# Patient Record
Sex: Female | Born: 1960 | Race: White | Hispanic: No | Marital: Married | Smoking: Current every day smoker
Health system: Southern US, Community
[De-identification: ages and names within clinical notes are randomized; demographics above are authoritative.]

## PROBLEM LIST (undated history)

## (undated) DIAGNOSIS — K5792 Diverticulitis of intestine, part unspecified, without perforation or abscess without bleeding: Secondary | ICD-10-CM

## (undated) DIAGNOSIS — D649 Anemia, unspecified: Secondary | ICD-10-CM

## (undated) DIAGNOSIS — I10 Essential (primary) hypertension: Secondary | ICD-10-CM

## (undated) DIAGNOSIS — J449 Chronic obstructive pulmonary disease, unspecified: Secondary | ICD-10-CM

## (undated) DIAGNOSIS — C539 Malignant neoplasm of cervix uteri, unspecified: Secondary | ICD-10-CM

## (undated) DIAGNOSIS — K219 Gastro-esophageal reflux disease without esophagitis: Secondary | ICD-10-CM

## (undated) DIAGNOSIS — J302 Other seasonal allergic rhinitis: Secondary | ICD-10-CM

## (undated) DIAGNOSIS — T7840XA Allergy, unspecified, initial encounter: Secondary | ICD-10-CM

## (undated) HISTORY — PX: APPENDECTOMY: SHX54

## (undated) HISTORY — DX: Anemia, unspecified: D64.9

## (undated) HISTORY — DX: Gastro-esophageal reflux disease without esophagitis: K21.9

## (undated) HISTORY — DX: Malignant neoplasm of cervix uteri, unspecified: C53.9

## (undated) HISTORY — DX: Chronic obstructive pulmonary disease, unspecified: J44.9

## (undated) HISTORY — DX: Allergy, unspecified, initial encounter: T78.40XA

## (undated) HISTORY — PX: LASER ABLATION CONDYLOMA CERVICAL / VULVAR: SUR819

## (undated) HISTORY — PX: WISDOM TOOTH EXTRACTION: SHX21

---

## 2011-11-22 ENCOUNTER — Other Ambulatory Visit (HOSPITAL_COMMUNITY): Payer: Self-pay

## 2011-11-22 ENCOUNTER — Emergency Department (HOSPITAL_COMMUNITY)
Admission: EM | Admit: 2011-11-22 | Discharge: 2011-11-22 | Disposition: A | Discharge status: Home Health | Payer: Self-pay | Attending: Emergency Medicine | Admitting: Emergency Medicine

## 2011-11-22 ENCOUNTER — Emergency Department (HOSPITAL_COMMUNITY): Payer: Self-pay

## 2011-11-22 ENCOUNTER — Encounter (HOSPITAL_COMMUNITY): Payer: Self-pay

## 2011-11-22 DIAGNOSIS — K5732 Diverticulitis of large intestine without perforation or abscess without bleeding: Secondary | ICD-10-CM | POA: Insufficient documentation

## 2011-11-22 DIAGNOSIS — K5792 Diverticulitis of intestine, part unspecified, without perforation or abscess without bleeding: Secondary | ICD-10-CM

## 2011-11-22 DIAGNOSIS — R109 Unspecified abdominal pain: Secondary | ICD-10-CM | POA: Insufficient documentation

## 2011-11-22 HISTORY — DX: Diverticulitis of intestine, part unspecified, without perforation or abscess without bleeding: K57.92

## 2011-11-22 LAB — URINALYSIS, ROUTINE W REFLEX MICROSCOPIC
Bilirubin Urine: NEGATIVE
Glucose, UA: NEGATIVE mg/dL
Ketones, ur: NEGATIVE mg/dL
Leukocytes, UA: NEGATIVE
Specific Gravity, Urine: 1.01 (ref 1.005–1.030)
pH: 6.5 (ref 5.0–8.0)

## 2011-11-22 LAB — COMPREHENSIVE METABOLIC PANEL
AST: 17 U/L (ref 0–37)
Albumin: 3.5 g/dL (ref 3.5–5.2)
Alkaline Phosphatase: 102 U/L (ref 39–117)
Chloride: 102 mEq/L (ref 96–112)
Potassium: 3.9 mEq/L (ref 3.5–5.1)
Total Bilirubin: 0.5 mg/dL (ref 0.3–1.2)
Total Protein: 7.1 g/dL (ref 6.0–8.3)

## 2011-11-22 LAB — URINE MICROSCOPIC-ADD ON

## 2011-11-22 LAB — DIFFERENTIAL
Basophils Absolute: 0.1 10*3/uL (ref 0.0–0.1)
Basophils Relative: 0 % (ref 0–1)
Neutro Abs: 11.1 10*3/uL — ABNORMAL HIGH (ref 1.7–7.7)
Neutrophils Relative %: 74 % (ref 43–77)

## 2011-11-22 LAB — CBC
MCHC: 35.1 g/dL (ref 30.0–36.0)
Platelets: 273 10*3/uL (ref 150–400)
RDW: 11.8 % (ref 11.5–15.5)

## 2011-11-22 MED ORDER — ONDANSETRON HCL 4 MG/2ML IJ SOLN
4.0000 mg | Freq: Once | INTRAMUSCULAR | Status: AC
  Administered 2011-11-22: 4 mg via INTRAVENOUS
  Filled 2011-11-22: qty 2

## 2011-11-22 MED ORDER — CIPROFLOXACIN HCL 500 MG PO TABS: 500.0000 mg | ORAL_TABLET | Freq: Two times a day (BID) | ORAL | Status: AC

## 2011-11-22 MED ORDER — IOHEXOL 300 MG/ML  SOLN
100.0000 mL | Freq: Once | INTRAMUSCULAR | Status: AC | PRN
  Administered 2011-11-22: 100 mL via INTRAVENOUS

## 2011-11-22 MED ORDER — ONDANSETRON HCL 4 MG PO TABS: 4.0000 mg | ORAL_TABLET | Freq: Four times a day (QID) | ORAL | Status: AC

## 2011-11-22 MED ORDER — CIPROFLOXACIN IN D5W 400 MG/200ML IV SOLN
400.0000 mg | Freq: Once | INTRAVENOUS | Status: AC
  Administered 2011-11-22: 400 mg via INTRAVENOUS
  Filled 2011-11-22: qty 200

## 2011-11-22 MED ORDER — OXYCODONE-ACETAMINOPHEN 5-325 MG PO TABS: 2.0000 | ORAL_TABLET | ORAL | Status: AC | PRN

## 2011-11-22 MED ORDER — METRONIDAZOLE 500 MG PO TABS: 500.0000 mg | ORAL_TABLET | Freq: Two times a day (BID) | ORAL | Status: AC

## 2011-11-22 MED ORDER — SODIUM CHLORIDE 0.9 % IV BOLUS (SEPSIS)
1000.0000 mL | Freq: Once | INTRAVENOUS | Status: AC
  Administered 2011-11-22: 1000 mL via INTRAVENOUS

## 2011-11-22 MED ORDER — HYDROMORPHONE HCL PF 1 MG/ML IJ SOLN
1.0000 mg | Freq: Once | INTRAMUSCULAR | Status: AC
  Administered 2011-11-22: 1 mg via INTRAVENOUS
  Filled 2011-11-22: qty 1

## 2011-11-22 MED ORDER — METRONIDAZOLE IN NACL 5-0.79 MG/ML-% IV SOLN
500.0000 mg | Freq: Once | INTRAVENOUS | Status: AC
  Administered 2011-11-22: 500 mg via INTRAVENOUS
  Filled 2011-11-22: qty 100

## 2011-11-22 NOTE — ED Notes (Signed)
Patient reports that she has had a cramping mid abdominal pain since yesterday that has gotten progressively worse. Patient also c/o nausea, but denies vomiting or diarrhea.

## 2011-11-22 NOTE — Discharge Instructions (Signed)
Diverticulitis Small pockets or "bubbles" can develop in the wall of the intestine. Diverticulitis is when those pockets become infected and inflamed. This causes stomach pain (usually on the left side). HOME CARE  Take all medicine as told by your doctor.   Try a clear liquid diet (broth, tea, or water) for as long as told by your doctor.   Keep all follow-up visits with your doctor.   You may be put on a low-fiber diet once you start feeling better. Here are foods that have low-fiber:   White breads, cereals, rice, and pasta.   Cooked fruits and vegetables or soft fresh fruits and vegetables without the skin.   Ground or well-cooked tender beef, ham, veal, lamb, pork, or poultry.   Eggs and seafood.   After you are doing well on the low-fiber diet, you may be put on a high-fiber diet. Here are ways to increase your fiber:   Choose whole-grain breads, cereals, pasta, and brown rice.   Choose fruits and vegetables with skin on. Do not overcook the vegetables.   Choose nuts, seeds, legumes, dried peas, beans, and lentils.   Look for food products that have more than 3 grams of fiber per serving on the food label.  GET HELP RIGHT AWAY IF:  Your pain does not get better or gets worse.   You have trouble eating food.   You are not pooping (having bowel movements) like normal.   You have a temperature by mouth above 102 F (38.9 C), not controlled by medicine.   You keep throwing up (vomiting).   You have bloody or black, tarry poop (stools).   You are getting worse and not better.  MAKE SURE YOU:   Understand these instructions.   Will watch your condition.   Will get help right away if you are not doing well or get worse.  Document Released: 01/03/2008 Document Revised: 07/06/2011 Document Reviewed: 06/07/2009 [facility] Patient Information 2012 [place], [place].  2 different antibiotics, medications for pain and nausea, avoid rich greasy foods. Return if getting  worse in any way, such as fever, chills, worsening pain

## 2011-11-22 NOTE — ED Provider Notes (Addendum)
History     CSN: [id number]  Arrival date & time 11/22/11  0846   First MD Initiated Contact with Patient 11/22/11 [phone]      Chief Complaint  Patient presents with  . Abdominal Pain    (Consider location/radiation/quality/duration/timing/severity/associated sxs/prior treatment) HPI.... lower abdominal pain for approximately one day. She states history of diverticulitis and this feels similarly.  No fever sweats or chills. Palpation makes pain worse. pain is moderate. No radiation. Described as sharp  Past Medical History  Diagnosis Date  . Diverticulitis     Past Surgical History  Procedure Date  . Appendectomy     No family history on file.  History  Substance Use Topics  . Smoking status: Current Everyday Smoker -- 1.0 packs/day for 25 years    Types: Cigarettes  . Smokeless tobacco: Not on file  . Alcohol Use: Yes     occasionally    OB History    Grav Para Term Preterm Abortions TAB SAB Ect Mult Living                  Review of Systems  All other systems reviewed and are negative.    Allergies  Review of patient's allergies indicates no known allergies.  Home Medications   Current Outpatient Rx  Name Route Sig Dispense Refill  . NAPROXEN SODIUM 220 MG PO TABS Oral Take 660 mg by mouth 2 (two) times daily with a meal.    . NYQUIL MULTI-SYMPTOM PO Oral Take 15 mLs by mouth at bedtime.    Marland Kitchen CIPROFLOXACIN HCL 500 MG PO TABS Oral Take 1 tablet (500 mg total) by mouth 2 (two) times daily. 20 tablet 0  . METRONIDAZOLE 500 MG PO TABS Oral Take 1 tablet (500 mg total) by mouth 2 (two) times daily. 20 tablet 0  . ONDANSETRON HCL 4 MG PO TABS Oral Take 1 tablet (4 mg total) by mouth every 6 (six) hours. 10 tablet 0  . OXYCODONE-ACETAMINOPHEN 5-325 MG PO TABS Oral Take 2 tablets by mouth every 4 (four) hours as needed for pain. 20 tablet 0    BP 141/90  Pulse 107  Temp(Src) 98.2 F (36.8 C) (Oral)  Resp 20  SpO2 95%  Physical Exam  Nursing note and  vitals reviewed. Constitutional: She is oriented to person, place, and time. She appears well-developed and well-nourished.  HENT:  Head: Normocephalic and atraumatic.  Eyes: Conjunctivae and EOM are normal. Pupils are equal, round, and reactive to light.  Neck: Normal range of motion. Neck supple.  Cardiovascular: Normal rate and regular rhythm.   Pulmonary/Chest: Effort normal and breath sounds normal.  Abdominal: Bowel sounds are normal.       Minimal tenderness lower abdomen  Musculoskeletal: Normal range of motion.  Neurological: She is alert and oriented to person, place, and time.  Skin: Skin is warm and dry.  Psychiatric: She has a normal mood and affect.    ED Course  Procedures (including critical care time)  Labs Reviewed  CBC - Abnormal; Notable for the following:    WBC 14.9 (*)    All other components within normal limits  DIFFERENTIAL - Abnormal; Notable for the following:    Neutro Abs 11.1 (*)    Monocytes Absolute 1.4 (*)    All other components within normal limits  COMPREHENSIVE METABOLIC PANEL - Abnormal; Notable for the following:    Glucose, Bld 106 (*)    All other components within normal limits  URINALYSIS, ROUTINE W  REFLEX MICROSCOPIC - Abnormal; Notable for the following:    APPearance CLOUDY (*)    Hgb urine dipstick SMALL (*)    All other components within normal limits  URINE MICROSCOPIC-ADD ON - Abnormal; Notable for the following:    Squamous Epithelial / LPF FEW (*)    Bacteria, UA FEW (*)    All other components within normal limits  LIPASE, BLOOD   Ct Abdomen Pelvis W Contrast  11/22/2011  *RADIOLOGY REPORT*  Clinical Data: Left lower quadrant pain.  History of diverticulitis.  Previous appendectomy.  CT ABDOMEN AND PELVIS WITH CONTRAST  Technique:  Multidetector CT imaging of the abdomen and pelvis was performed following the standard protocol during bolus administration of intravenous contrast.  Contrast: OMNIPAQUE IOHEXOL 300 MG/ML   SOLN  Comparison: None.  Findings: Lung bases are clear.  No pleural or pericardial fluid. Minimal scarring at the right base posteriorly.  The liver does not show any focal lesions.  No calcified gallstones.  The spleen is normal.  The pancreas is normal.  The adrenal glands are normal.  The kidneys are normal.  The aorta shows atherosclerotic change but no aneurysm.  The IVC is normal. No retroperitoneal mass or adenopathy.  No free intraperitoneal fluid or air.  The patient does have diverticulosis of the left colon.  In the sigmoid region, there is acute diverticulitis with wall thickening and stranding of the pericolic fat.  There is a tiny amount of free fluid in the pelvis.  Uterus and adnexal regions appear unremarkable.  No significant bony finding.  IMPRESSION: Sigmoid diverticulitis.  Stranding of the pericolic fat but no definable abscess.  Small amount of free fluid in the cul-de-sac.  Premature atherosclerosis of the aorta.  Original Report Authenticated By: Thomasenia Sales, M.D.     1. Diverticulitis       MDM  CT scan shows sigmoid diverticulitis. Patient is hemodynamically stable and wants to try home treatment.  We'll discharge home with Flagyl and Cipro. This was discussed with patient in detail.        Donnetta Hutching, MD 11/30/11 6213  Donnetta Hutching, MD 11/30/11 2303

## 2012-03-12 ENCOUNTER — Emergency Department (HOSPITAL_COMMUNITY): Payer: Self-pay

## 2012-03-12 ENCOUNTER — Encounter (HOSPITAL_COMMUNITY): Payer: Self-pay | Admitting: Emergency Medicine

## 2012-03-12 ENCOUNTER — Emergency Department (HOSPITAL_COMMUNITY)
Admission: EM | Admit: 2012-03-12 | Discharge: 2012-03-12 | Disposition: A | Discharge status: Home / Self Care | Payer: Self-pay | Attending: Emergency Medicine | Admitting: Emergency Medicine

## 2012-03-12 DIAGNOSIS — J209 Acute bronchitis, unspecified: Secondary | ICD-10-CM | POA: Insufficient documentation

## 2012-03-12 DIAGNOSIS — Z9089 Acquired absence of other organs: Secondary | ICD-10-CM | POA: Insufficient documentation

## 2012-03-12 DIAGNOSIS — F17200 Nicotine dependence, unspecified, uncomplicated: Secondary | ICD-10-CM | POA: Insufficient documentation

## 2012-03-12 MED ORDER — ALBUTEROL SULFATE HFA 108 (90 BASE) MCG/ACT IN AERS
2.0000 | INHALATION_SPRAY | RESPIRATORY_TRACT | Status: DC | PRN
  Administered 2012-03-12: 2 via RESPIRATORY_TRACT
  Filled 2012-03-12: qty 6.7

## 2012-03-12 MED ORDER — ALBUTEROL SULFATE (5 MG/ML) 0.5% IN NEBU
5.0000 mg | INHALATION_SOLUTION | Freq: Once | RESPIRATORY_TRACT | Status: AC
  Administered 2012-03-12: 5 mg via RESPIRATORY_TRACT
  Filled 2012-03-12: qty 1

## 2012-03-12 NOTE — ED Notes (Signed)
Started 4 days ago with sinus drainage, cough, chills. Shortness of breath

## 2012-03-12 NOTE — ED Notes (Signed)
resp therapist called 

## 2012-03-12 NOTE — ED Notes (Signed)
resp therapist called for abuterol HFA,

## 2012-03-12 NOTE — ED Provider Notes (Signed)
History     CSN: [id number]  Arrival date & time 03/12/12  0805   First MD Initiated Contact with Patient 03/12/12 [phone]      Chief Complaint  Patient presents with  . Cough  . URI  . Shortness of Breath    (Consider location/radiation/quality/duration/timing/severity/associated sxs/prior treatment) HPI Complains of cough shortness of breath nasal congestion onset 4 days ago. Cough with slight amount of white sputum patient reports fever 101 yesterday no other associated symptoms nothing makes symptoms better or worse 2 Nyquill and Aleve without relief Past Medical History  Diagnosis Date  . Diverticulitis     Past Surgical History  Procedure Date  . Appendectomy     No family history on file.  History  Substance Use Topics  . Smoking status: Current Everyday Smoker -- 0.5 packs/day for 25 years    Types: Cigarettes  . Smokeless tobacco: Not on file  . Alcohol Use: Yes     occasionally    OB History    Grav Para Term Preterm Abortions TAB SAB Ect Mult Living                  Review of Systems  Constitutional: Positive for fever.  HENT: Positive for congestion.   Respiratory: Positive for cough and shortness of breath.   Cardiovascular: Negative.   Gastrointestinal: Negative.   Musculoskeletal: Negative.   Skin: Negative.   Neurological: Negative.   Hematological: Negative.   Psychiatric/Behavioral: Negative.   All other systems reviewed and are negative.    Allergies  Review of patient's allergies indicates no known allergies.  Home Medications   Current Outpatient Rx  Name Route Sig Dispense Refill  . NAPROXEN SODIUM 220 MG PO TABS Oral Take 660 mg by mouth 2 (two) times daily with a meal.      BP 157/91  Pulse 87  Temp 98.3 F (36.8 C) (Oral)  Resp 20  SpO2 96%  Physical Exam  Nursing note and vitals reviewed. Constitutional: She appears well-developed and well-nourished.  HENT:  Head: Normocephalic and atraumatic.  Right Ear:  External ear normal.  Left Ear: External ear normal.  Nose: Nose normal.  Mouth/Throat: No oropharyngeal exudate.       Bilateral tympanic membranes normal  Eyes: Conjunctivae are normal. Pupils are equal, round, and reactive to light.  Neck: Neck supple. No tracheal deviation present. No thyromegaly present.  Cardiovascular: Normal rate and regular rhythm.   No murmur heard. Pulmonary/Chest: Effort normal.       Coughing frequently diffuse scattered rhonchi  Abdominal: Soft. Bowel sounds are normal. She exhibits no distension. There is no tenderness.  Musculoskeletal: Normal range of motion. She exhibits no edema and no tenderness.  Neurological: She is alert. Coordination normal.  Skin: Skin is warm and dry. No rash noted.  Psychiatric: She has a normal mood and affect.    ED Course  Procedures (including critical care time)  Labs Reviewed - No data to display No results found.  Results for orders placed during the hospital encounter of 11/22/11  CBC      Component Value Range   WBC 14.9 (*) 4.0 - 10.5 K/uL   RBC 3.98  3.87 - 5.11 MIL/uL   Hemoglobin 13.4  12.0 - 15.0 g/dL   HCT 11.9  14.7 - 82.9 %   MCV 96.0  78.0 - 100.0 fL   MCH 33.7  26.0 - 34.0 pg   MCHC 35.1  30.0 - 36.0 g/dL   RDW  11.8  11.5 - 15.5 %   Platelets 273  150 - 400 K/uL  DIFFERENTIAL      Component Value Range   Neutrophils Relative 74  43 - 77 %   Neutro Abs 11.1 (*) 1.7 - 7.7 K/uL   Lymphocytes Relative 15  12 - 46 %   Lymphs Abs 2.3  0.7 - 4.0 K/uL   Monocytes Relative 9  3 - 12 %   Monocytes Absolute 1.4 (*) 0.1 - 1.0 K/uL   Eosinophils Relative 1  0 - 5 %   Eosinophils Absolute 0.1  0.0 - 0.7 K/uL   Basophils Relative 0  0 - 1 %   Basophils Absolute 0.1  0.0 - 0.1 K/uL  COMPREHENSIVE METABOLIC PANEL      Component Value Range   Sodium 135  135 - 145 mEq/L   Potassium 3.9  3.5 - 5.1 mEq/L   Chloride 102  96 - 112 mEq/L   CO2 22  19 - 32 mEq/L   Glucose, Bld 106 (*) 70 - 99 mg/dL   BUN 8   6 - 23 mg/dL   Creatinine, Ser 1.61  0.50 - 1.10 mg/dL   Calcium 8.8  8.4 - 09.6 mg/dL   Total Protein 7.1  6.0 - 8.3 g/dL   Albumin 3.5  3.5 - 5.2 g/dL   AST 17  0 - 37 U/L   ALT 14  0 - 35 U/L   Alkaline Phosphatase 102  39 - 117 U/L   Total Bilirubin 0.5  0.3 - 1.2 mg/dL   GFR calc non Af Amer >90  >90 mL/min   GFR calc Af Amer >90  >90 mL/min  LIPASE, BLOOD      Component Value Range   Lipase 14  11 - 59 U/L  URINALYSIS, ROUTINE W REFLEX MICROSCOPIC      Component Value Range   Color, Urine YELLOW  YELLOW   APPearance CLOUDY (*) CLEAR   Specific Gravity, Urine 1.010  1.005 - 1.030   pH 6.5  5.0 - 8.0   Glucose, UA NEGATIVE  NEGATIVE mg/dL   Hgb urine dipstick SMALL (*) NEGATIVE   Bilirubin Urine NEGATIVE  NEGATIVE   Ketones, ur NEGATIVE  NEGATIVE mg/dL   Protein, ur NEGATIVE  NEGATIVE mg/dL   Urobilinogen, UA 0.2  0.0 - 1.0 mg/dL   Nitrite NEGATIVE  NEGATIVE   Leukocytes, UA NEGATIVE  NEGATIVE  URINE MICROSCOPIC-ADD ON      Component Value Range   Squamous Epithelial / LPF FEW (*) RARE   RBC / HPF 3-6  <3 RBC/hpf   Bacteria, UA FEW (*) RARE   Urine-Other MUCOUS PRESENT     Dg Chest 2 View  03/12/2012  *RADIOLOGY REPORT*  Clinical Data: Croupy cough, fever, congestion.  CHEST - 2 VIEW  Comparison: None  Findings: Heart and mediastinal contours are within normal limits. No focal opacities or effusions.  No acute bony abnormality.  IMPRESSION: No active cardiopulmonary disease.  Original Report Authenticated By: Cyndie Chime, M.D.    No diagnosis found.   Chest xray reviewed by me  Breathing somewhat improved after treatment with albuterol neb MDM  Plan albuterol HFA to go with spacer 2 puffs every 4 hours when necessary cough shortness of breath Smoking cessation encouraged(spent 5 minutes counciling pt on smoking cessation) bp rdecheck Resource guide referral Diagnosis #1 acute bronchitis #2 tobacco abuse #3 elevated blood pressure        Valerie Bugh,  MD 03/12/12 1042

## 2013-09-18 ENCOUNTER — Encounter (HOSPITAL_COMMUNITY): Payer: Self-pay | Admitting: Emergency Medicine

## 2013-09-18 ENCOUNTER — Emergency Department (HOSPITAL_COMMUNITY): Payer: Self-pay

## 2013-09-18 ENCOUNTER — Emergency Department (HOSPITAL_COMMUNITY)
Admission: EM | Admit: 2013-09-18 | Discharge: 2013-09-18 | Disposition: A | Discharge status: Home / Self Care | Payer: Self-pay | Attending: Emergency Medicine | Admitting: Emergency Medicine

## 2013-09-18 DIAGNOSIS — Z7982 Long term (current) use of aspirin: Secondary | ICD-10-CM | POA: Insufficient documentation

## 2013-09-18 DIAGNOSIS — Y9289 Other specified places as the place of occurrence of the external cause: Secondary | ICD-10-CM | POA: Insufficient documentation

## 2013-09-18 DIAGNOSIS — Z791 Long term (current) use of non-steroidal anti-inflammatories (NSAID): Secondary | ICD-10-CM | POA: Insufficient documentation

## 2013-09-18 DIAGNOSIS — K625 Hemorrhage of anus and rectum: Secondary | ICD-10-CM

## 2013-09-18 DIAGNOSIS — R109 Unspecified abdominal pain: Secondary | ICD-10-CM | POA: Insufficient documentation

## 2013-09-18 DIAGNOSIS — F17200 Nicotine dependence, unspecified, uncomplicated: Secondary | ICD-10-CM | POA: Insufficient documentation

## 2013-09-18 DIAGNOSIS — M549 Dorsalgia, unspecified: Secondary | ICD-10-CM

## 2013-09-18 DIAGNOSIS — Y9323 Activity, snow (alpine) (downhill) skiing, snow boarding, sledding, tobogganing and snow tubing: Secondary | ICD-10-CM | POA: Insufficient documentation

## 2013-09-18 DIAGNOSIS — Z79899 Other long term (current) drug therapy: Secondary | ICD-10-CM | POA: Insufficient documentation

## 2013-09-18 DIAGNOSIS — X58XXXA Exposure to other specified factors, initial encounter: Secondary | ICD-10-CM | POA: Insufficient documentation

## 2013-09-18 DIAGNOSIS — IMO0002 Reserved for concepts with insufficient information to code with codable children: Secondary | ICD-10-CM | POA: Insufficient documentation

## 2013-09-18 LAB — CBC
HEMATOCRIT: 42.7 % (ref 36.0–46.0)
Hemoglobin: 14.8 g/dL (ref 12.0–15.0)
MCH: 33.6 pg (ref 26.0–34.0)
MCHC: 34.7 g/dL (ref 30.0–36.0)
MCV: 96.8 fL (ref 78.0–100.0)
Platelets: 273 10*3/uL (ref 150–400)
RBC: 4.41 MIL/uL (ref 3.87–5.11)
RDW: 12.2 % (ref 11.5–15.5)
WBC: 10.4 10*3/uL (ref 4.0–10.5)

## 2013-09-18 LAB — COMPREHENSIVE METABOLIC PANEL
ALT: 20 U/L (ref 0–35)
AST: 21 U/L (ref 0–37)
Albumin: 4.3 g/dL (ref 3.5–5.2)
Alkaline Phosphatase: 108 U/L (ref 39–117)
BILIRUBIN TOTAL: 0.3 mg/dL (ref 0.3–1.2)
BUN: 15 mg/dL (ref 6–23)
CHLORIDE: 101 meq/L (ref 96–112)
CO2: 22 meq/L (ref 19–32)
CREATININE: 0.72 mg/dL (ref 0.50–1.10)
Calcium: 9.6 mg/dL (ref 8.4–10.5)
Glucose, Bld: 104 mg/dL — ABNORMAL HIGH (ref 70–99)
Potassium: 4.4 mEq/L (ref 3.7–5.3)
Sodium: 138 mEq/L (ref 137–147)
Total Protein: 8.1 g/dL (ref 6.0–8.3)

## 2013-09-18 LAB — POC OCCULT BLOOD, ED: FECAL OCCULT BLD: POSITIVE — AB

## 2013-09-18 MED ORDER — HYDROCORTISONE ACETATE 25 MG RE SUPP: 25.0000 mg | Freq: Two times a day (BID) | RECTAL | Status: DC

## 2013-09-18 MED ORDER — IOHEXOL 300 MG/ML  SOLN
100.0000 mL | Freq: Once | INTRAMUSCULAR | Status: AC | PRN
  Administered 2013-09-18: 100 mL via INTRAVENOUS

## 2013-09-18 MED ORDER — IOHEXOL 300 MG/ML  SOLN
50.0000 mL | Freq: Once | INTRAMUSCULAR | Status: AC | PRN
  Administered 2013-09-18: 50 mL via ORAL

## 2013-09-18 NOTE — ED Provider Notes (Signed)
CSN: [id number]     Arrival date & time 09/18/13  1243 History   First MD Initiated Contact with Patient 09/18/13 1253     Chief Complaint  Patient presents with  . Back Pain  . sledding accident   . Rectal Bleeding     (Consider location/radiation/quality/duration/timing/severity/associated sxs/prior Treatment) HPI Comments: Patient presents to the ER for evaluation of rectal bleeding. Patient also reports that she injured her back 3 days ago, is not sure if it is related. Patient reports that the injury occurred while sledding. She said she was going downhill and he thought, jarring her lower back. Since then she has had pain in her lower back which is moderate, worsens with movement.  This morning she had cramping in her lower abdomen. She said she had the urge to defecate and when she went to the bathroom there was bright red blood. Since then she has had several episodes of feeling her to defecate, but only passing small amounts of blood. No chest pain, shortness of breath, palpitations, passing out.  Patient is a 53 y.o. female presenting with back pain and hematochezia.  Back Pain Associated symptoms: abdominal pain   Associated symptoms: no fever   Rectal Bleeding Associated symptoms: abdominal pain   Associated symptoms: no fever     Past Medical History  Diagnosis Date  . Diverticulitis    Past Surgical History  Procedure Laterality Date  . Appendectomy     No family history on file. History  Substance Use Topics  . Smoking status: Current Every Day Smoker -- 0.50 packs/day for 25 years    Types: Cigarettes  . Smokeless tobacco: Not on file  . Alcohol Use: Yes     Comment: occasionally   OB History   Grav Para Term Preterm Abortions TAB SAB Ect Mult Living                 Review of Systems  Constitutional: Negative for fever.  Gastrointestinal: Positive for abdominal pain and hematochezia.  Musculoskeletal: Positive for back pain.  Neurological: Negative.    All other systems reviewed and are negative.      Allergies  Review of patient's allergies indicates no known allergies.  Home Medications   Current Outpatient Rx  Name  Route  Sig  Dispense  Refill  . aspirin EC 81 MG tablet   Oral   Take 81 mg by mouth daily.         . cetirizine (ZYRTEC) 10 MG tablet   Oral   Take 10 mg by mouth daily.         . naproxen sodium (ANAPROX) 220 MG tablet   Oral   Take 660 mg by mouth 2 (two) times daily with a meal.          BP 195/130  Pulse 119  Temp(Src) 99.3 F (37.4 C) (Oral)  Resp 20  SpO2 95% Physical Exam  Constitutional: She is oriented to person, place, and time. She appears well-developed and well-nourished. No distress.  HENT:  Head: Normocephalic and atraumatic.  Right Ear: Hearing normal.  Left Ear: Hearing normal.  Nose: Nose normal.  Mouth/Throat: Oropharynx is clear and moist and mucous membranes are normal.  Eyes: Conjunctivae and EOM are normal. Pupils are equal, round, and reactive to light.  Neck: Normal range of motion. Neck supple.  Cardiovascular: Regular rhythm, S1 normal and S2 normal.  Exam reveals no gallop and no friction rub.   No murmur heard. Pulmonary/Chest: Effort normal  and breath sounds normal. No respiratory distress. She exhibits no tenderness.  Abdominal: Soft. Normal appearance and bowel sounds are normal. There is no hepatosplenomegaly. There is no tenderness. There is no rebound, no guarding, no tenderness at McBurney's point and negative Murphy's sign. No hernia.  Musculoskeletal: Normal range of motion.       Lumbar back: She exhibits tenderness.  Neurological: She is alert and oriented to person, place, and time. She has normal strength. No cranial nerve deficit or sensory deficit. Coordination normal. GCS eye subscore is 4. GCS verbal subscore is 5. GCS motor subscore is 6.  Skin: Skin is warm, dry and intact. No rash noted. No cyanosis.  Psychiatric: She has a normal mood and  affect. Her speech is normal and behavior is normal. Thought content normal.    ED Course  Procedures (including critical care time) Labs Review Labs Reviewed  COMPREHENSIVE METABOLIC PANEL - Abnormal; Notable for the following:    Glucose, Bld 104 (*)    All other components within normal limits  POC OCCULT BLOOD, ED - Abnormal; Notable for the following:    Fecal Occult Bld POSITIVE (*)    All other components within normal limits  CBC   Imaging Review Ct Abdomen Pelvis W Contrast  09/18/2013   CLINICAL DATA:  Sledding accident. Abdominal pain and bright red blood per rectum.  EXAM: CT ABDOMEN AND PELVIS WITH CONTRAST  TECHNIQUE: Multidetector CT imaging of the abdomen and pelvis was performed using the standard protocol following bolus administration of intravenous contrast.  CONTRAST:  158mL OMNIPAQUE IOHEXOL 300 MG/ML  SOLN  COMPARISON:  11/22/2011  FINDINGS: No evidence of lacerations or contusions to the abdominal parenchymal organs. No evidence of hemoperitoneum or retroperitoneal hemorrhage.  The liver shows mild hepatic steatosis. No liver masses are identified. The gallbladder, pancreas, spleen, adrenal glands, and kidneys are normal in appearance. No evidence hydronephrosis.  No soft tissue masses or lymphadenopathy identified. Uterus and adnexal regions are unremarkable in appearance. Diverticulosis is seen involving the descending and sigmoid portions of the colon, however there is no evidence of diverticulitis. No other inflammatory process or abnormal fluid collections identified. No fracture identified.  IMPRESSION: No evidence of visceral injury or other acute findings.  Diverticulosis. No radiographic evidence of diverticulitis.  Mild hepatic steatosis.   Electronically Signed   By: Earle Gell M.D.   On: 09/18/2013 15:24    EKG Interpretation   None       MDM   Final diagnoses:  None    Results to the ER with progressive low back pain after an injury which occurred  several days ago. She has normal neurologic function, no radiation of the pain. Patient now complaining of rectal bleeding. She has a history of diverticulitis 2 years ago. A CAT scan was performed showing acute injury or other problems no unexplained bleeding. Possibly diverticular bleeding, but mild. The patient is not hypotensive or anemic. She will be referred for outpatient followup with GI. Patient was counseled to return for any increased bleeding, shortness of breath, palpitations, passing out.    Orpah Greek, MD 09/18/13 [phone]

## 2013-09-18 NOTE — Discharge Instructions (Signed)
Back Pain, Adult Low back pain is very common. About 1 in 5 people have back pain.The cause of low back pain is rarely dangerous. The pain often gets better over time.About half of people with a sudden onset of back pain feel better in just 2 weeks. About 8 in 10 people feel better by 6 weeks.  CAUSES Some common causes of back pain include:  Strain of the muscles or ligaments supporting the spine.  Wear and tear (degeneration) of the spinal discs.  Arthritis.  Direct injury to the back. DIAGNOSIS Most of the time, the direct cause of low back pain is not known.However, back pain can be treated effectively even when the exact cause of the pain is unknown.Answering your caregiver's questions about your overall health and symptoms is one of the most accurate ways to make sure the cause of your pain is not dangerous. If your caregiver needs more information, he or she may order lab work or imaging tests (X-rays or MRIs).However, even if imaging tests show changes in your back, this usually does not require surgery. HOME CARE INSTRUCTIONS For many people, back pain returns.Since low back pain is rarely dangerous, it is often a condition that people can learn to [facility] their own.   Remain active. It is stressful on the back to sit or stand in one place. Do not sit, drive, or stand in one place for more than 30 minutes at a time. Take short walks on level surfaces as soon as pain allows.Try to increase the length of time you walk each day.  Do not stay in bed.Resting more than 1 or 2 days can delay your recovery.  Do not avoid exercise or work.Your body is made to move.It is not dangerous to be active, even though your back may hurt.Your back will likely heal faster if you return to being active before your pain is gone.  Pay attention to your body when you bend and lift. Many people have less discomfortwhen lifting if they bend their knees, keep the load close to their bodies,and  avoid twisting. Often, the most comfortable positions are those that put less stress on your recovering back.  Find a comfortable position to sleep. Use a firm mattress and lie on your side with your knees slightly bent. If you lie on your back, put a pillow under your knees.  Only take over-the-counter or prescription medicines as directed by your caregiver. Over-the-counter medicines to reduce pain and inflammation are often the most helpful.Your caregiver may prescribe muscle relaxant drugs.These medicines help dull your pain so you can more quickly return to your normal activities and healthy exercise.  Put ice on the injured area.  Put ice in a plastic bag.  Place a towel between your skin and the bag.  Leave the ice on for 15-20 minutes, 03-04 times a day for the first 2 to 3 days. After that, ice and heat may be alternated to reduce pain and spasms.  Ask your caregiver about trying back exercises and gentle massage. This may be of some benefit.  Avoid feeling anxious or stressed.Stress increases muscle tension and can worsen back pain.It is important to recognize when you are anxious or stressed and learn ways to manage it.Exercise is a great option. SEEK MEDICAL CARE IF:  You have pain that is not relieved with rest or medicine.  You have pain that does not improve in 1 week.  You have new symptoms.  You are generally not feeling well. SEEK  IMMEDIATE MEDICAL CARE IF:   You have pain that radiates from your back into your legs.  You develop new bowel or bladder control problems.  You have unusual weakness or numbness in your arms or legs.  You develop nausea or vomiting.  You develop abdominal pain.  You feel faint. Document Released: 07/17/2005 Document Revised: 01/16/2012 Document Reviewed: 12/05/2010 [facility] Patient Information 2014 [place], [place].  Rectal Bleeding Rectal bleeding is when blood passes out of the anus. It is usually a sign that something is  wrong. It may not be serious, but it should always be evaluated. Rectal bleeding may present as bright red blood or extremely dark stools. The color may range from dark red or maroon to black (like tar). It is important that the cause of rectal bleeding be identified so treatment can be started and the problem corrected. CAUSES   Hemorrhoids. These are enlarged (dilated) blood vessels or veins in the anal or rectal area.  Fistulas. Theseare abnormal, burrowing channels that usually run from inside the rectum to the skin around the anus. They can bleed.  Anal fissures. This is a tear in the tissue of the anus. Bleeding occurs with bowel movements.  Diverticulosis. This is a condition in which pockets or sacs project from the bowel wall. Occasionally, the sacs can bleed.  Diverticulitis. Thisis an infection involving diverticulosis of the colon.  Proctitis and colitis. These are conditions in which the rectum, colon, or both, can become inflamed and pitted (ulcerated).  Polyps and cancer. Polyps are non-cancerous (benign) growths in the colon that may bleed. Certain types of polyps turn into cancer.  Protrusion of the rectum. Part of the rectum can project from the anus and bleed.  Certain medicines.  Intestinal infections.  Blood vessel abnormalities. HOME CARE INSTRUCTIONS  Eat a high-fiber diet to keep your stool soft.  Limit activity.  Drink enough fluids to keep your urine clear or pale yellow.  Warm baths may be useful to soothe rectal pain.  Follow up with your caregiver as directed. SEEK IMMEDIATE MEDICAL CARE IF:  You develop increased bleeding.  You have black or dark red stools.  You vomit blood or material that looks like coffee grounds.  You have abdominal pain or tenderness.  You have a fever.  You feel weak, nauseous, or you faint.  You have severe rectal pain or you are unable to have a bowel movement. MAKE SURE YOU:  Understand these  instructions.  Will watch your condition.  Will get help right away if you are not doing well or get worse. Document Released: 01/06/2002 Document Revised: 10/09/2011 Document Reviewed: 01/01/2011 [facility] Patient Information 2014 [place], [place].

## 2013-09-18 NOTE — ED Notes (Signed)
Pt had sleding accident on Monday night. Then today pt started having sever lower back pain and lower abd cramping with blood in her stools times 7. Pt states bright red blood was in her stool, in the water and when she wiped.

## 2013-09-18 NOTE — ED Notes (Signed)
Patient transported to CT 

## 2014-04-23 ENCOUNTER — Emergency Department (HOSPITAL_COMMUNITY)
Admission: EM | Admit: 2014-04-23 | Discharge: 2014-04-23 | Disposition: A | Discharge status: Home / Self Care | Payer: Self-pay | Attending: Emergency Medicine | Admitting: Emergency Medicine

## 2014-04-23 ENCOUNTER — Encounter (HOSPITAL_COMMUNITY): Payer: Self-pay | Admitting: Emergency Medicine

## 2014-04-23 DIAGNOSIS — IMO0002 Reserved for concepts with insufficient information to code with codable children: Secondary | ICD-10-CM | POA: Insufficient documentation

## 2014-04-23 DIAGNOSIS — R609 Edema, unspecified: Secondary | ICD-10-CM | POA: Insufficient documentation

## 2014-04-23 DIAGNOSIS — K047 Periapical abscess without sinus: Secondary | ICD-10-CM | POA: Insufficient documentation

## 2014-04-23 DIAGNOSIS — Z79899 Other long term (current) drug therapy: Secondary | ICD-10-CM | POA: Insufficient documentation

## 2014-04-23 DIAGNOSIS — F17200 Nicotine dependence, unspecified, uncomplicated: Secondary | ICD-10-CM | POA: Insufficient documentation

## 2014-04-23 DIAGNOSIS — Z7982 Long term (current) use of aspirin: Secondary | ICD-10-CM | POA: Insufficient documentation

## 2014-04-23 DIAGNOSIS — Z791 Long term (current) use of non-steroidal anti-inflammatories (NSAID): Secondary | ICD-10-CM | POA: Insufficient documentation

## 2014-04-23 HISTORY — DX: Other seasonal allergic rhinitis: J30.2

## 2014-04-23 MED ORDER — LIDOCAINE-EPINEPHRINE (PF) 2 %-1:200000 IJ SOLN
10.0000 mL | Freq: Once | INTRAMUSCULAR | Status: AC
  Administered 2014-04-23: 08:00:00
  Filled 2014-04-23: qty 10

## 2014-04-23 MED ORDER — PREDNISONE 10 MG PO TABS: 20.0000 mg | ORAL_TABLET | Freq: Every day | ORAL | Status: DC

## 2014-04-23 MED ORDER — PENICILLIN V POTASSIUM 500 MG PO TABS
500.0000 mg | ORAL_TABLET | Freq: Once | ORAL | Status: AC
  Administered 2014-04-23: 500 mg via ORAL
  Filled 2014-04-23: qty 1

## 2014-04-23 MED ORDER — PENICILLIN V POTASSIUM 500 MG PO TABS: 500.0000 mg | ORAL_TABLET | Freq: Four times a day (QID) | ORAL | Status: DC

## 2014-04-23 MED ORDER — PREDNISONE 20 MG PO TABS
60.0000 mg | ORAL_TABLET | Freq: Once | ORAL | Status: AC
  Administered 2014-04-23: 60 mg via ORAL
  Filled 2014-04-23: qty 3

## 2014-04-23 NOTE — Discharge Instructions (Signed)
Abscessed Tooth An abscessed tooth is an infection around your tooth. It may be caused by holes or damage to the tooth (cavity) or a dental disease. An abscessed tooth causes mild to very bad pain in and around the tooth. See your dentist right away if you have tooth or gum pain. HOME CARE  Take your medicine as told. Finish it even if you start to feel better.  Do not drive after taking pain medicine.  Rinse your mouth (gargle) often with salt water ( teaspoon salt in 8 ounces of warm water).  Do not apply heat to the outside of your face. GET HELP RIGHT AWAY IF:   You have a temperature by mouth above 102 F (38.9 C), not controlled by medicine.  You have chills and a very bad headache.  You have problems breathing or swallowing.  Your mouth will not open.  You develop puffiness (swelling) on the neck or around the eye.  Your pain is not helped by medicine.  Your pain is getting worse instead of better. MAKE SURE YOU:   Understand these instructions.  Will watch your condition.  Will get help right away if you are not doing well or get worse. Document Released: 01/03/2008 Document Revised: 10/09/2011 Document Reviewed: 10/25/2010 [employer] Patient Information 2015 [employer]. This information is not intended to replace advice given to you by your health care provider. Make sure you discuss any questions you have with your health care provider.  

## 2014-04-23 NOTE — ED Provider Notes (Signed)
CSN: [id number]     Arrival date & time 04/23/14  0645 History   First MD Initiated Contact with Patient 04/23/14 [phone]     Chief Complaint  Patient presents with  . Facial Swelling      HPI  Tooth broke several weeks ago. Became swollen 2 days ago. Worse this morning. She states that she noticed since this morning "all the wrinkles were gone from my face because of a swelling underneath them".  No posterior pharyngeal swelling or sensation. No difficulty speaking or swallowing. No neck symptoms. This is an upper tooth.  Past Medical History  Diagnosis Date  . Diverticulitis   . Seasonal allergies    Past Surgical History  Procedure Laterality Date  . Appendectomy     Family History  Problem Relation Age of Onset  . Cancer Other    History  Substance Use Topics  . Smoking status: Current Every Day Smoker -- 0.50 packs/day for 25 years    Types: Cigarettes  . Smokeless tobacco: Not on file  . Alcohol Use: Yes     Comment: occasionally   OB History   Grav Para Term Preterm Abortions TAB SAB Ect Mult Living                 Review of Systems  Constitutional: Negative for fever, chills, diaphoresis, appetite change and fatigue.  HENT: Positive for dental problem. Negative for mouth sores, sore throat and trouble swallowing.        Facial swelling  Eyes: Negative for visual disturbance.  Respiratory: Negative for cough, chest tightness, shortness of breath and wheezing.   Cardiovascular: Negative for chest pain.  Gastrointestinal: Negative for nausea, vomiting, abdominal pain, diarrhea and abdominal distention.  Endocrine: Negative for polydipsia, polyphagia and polyuria.  Genitourinary: Negative for dysuria, frequency and hematuria.  Musculoskeletal: Negative for gait problem.  Skin: Negative for color change, pallor and rash.  Neurological: Negative for dizziness, syncope, light-headedness and headaches.  Hematological: Does not bruise/bleed easily.    Psychiatric/Behavioral: Negative for behavioral problems and confusion.      Allergies  Review of patient's allergies indicates no known allergies.  Home Medications   Prior to Admission medications   Medication Sig Start Date End Date Taking? Authorizing Provider  aspirin EC 81 MG tablet Take 81 mg by mouth daily.    Historical Provider, MD  cetirizine (ZYRTEC) 10 MG tablet Take 10 mg by mouth daily.    Historical Provider, MD  hydrocortisone (ANUSOL-HC) 25 MG suppository Place 1 suppository (25 mg total) rectally 2 (two) times daily. For 7 days 09/18/13   Orpah Greek, MD  naproxen sodium (ANAPROX) 220 MG tablet Take 660 mg by mouth 2 (two) times daily with a meal.    Historical Provider, MD  penicillin v potassium (VEETID) 500 MG tablet Take 1 tablet (500 mg total) by mouth 4 (four) times daily. 04/23/14   Tanna Furry, MD  predniSONE (DELTASONE) 10 MG tablet Take 2 tablets (20 mg total) by mouth daily. 04/23/14   Tanna Furry, MD   BP 159/89  Pulse 84  Temp(Src) 97.8 F (36.6 C) (Oral)  Resp 18  Ht 5\' 4"  (1.626 m)  Wt 130 lb (58.968 kg)  BMI 22.30 kg/m2  SpO2 96% Physical Exam  Constitutional: She is oriented to person, place, and time. She appears well-developed and well-nourished. No distress.  HENT:  Head: Normocephalic.    Mouth/Throat:    Eyes: Conjunctivae are normal. Pupils are equal, round, and reactive to  light. No scleral icterus.  Neck: Normal range of motion. Neck supple. No thyromegaly present.  Cardiovascular: Normal rate and regular rhythm.  Exam reveals no gallop and no friction rub.   No murmur heard. Pulmonary/Chest: Effort normal and breath sounds normal. No respiratory distress. She has no wheezes. She has no rales.  Abdominal: Soft. Bowel sounds are normal. She exhibits no distension. There is no tenderness. There is no rebound.  Musculoskeletal: Normal range of motion.  Neurological: She is alert and oriented to person, place, and time.   Skin: Skin is warm and dry. No rash noted.  Psychiatric: She has a normal mood and affect. Her behavior is normal.    ED Course  INCISION AND DRAINAGE Date/Time: 04/23/2014 7:27 AM Performed by: Tanna Furry Authorized by: Tanna Furry Consent: Verbal consent obtained. written consent not obtained. Consent given by: patient Patient understanding: patient states understanding of the procedure being performed Patient identity confirmed: verbally with patient Type: abscess Body area: head/neck (gingival/periodontal) Anesthesia: local infiltration Local anesthetic: lidocaine 2% with epinephrine Anesthetic total: 2 ml Patient sedated: no Scalpel size: 11 Incision type: single straight Complexity: simple Drainage: purulent Drainage amount: 1cc. Wound treatment: wound left open Patient tolerance: Patient tolerated the procedure well with no immediate complications. Comments: Swish and spit after   (including critical care time) Labs Review Labs Reviewed - No data to display  Imaging Review No results found.   EKG Interpretation None      MDM   Final diagnoses:  Dental abscess        Tanna Furry, MD 04/23/14 [phone]

## 2014-04-23 NOTE — ED Notes (Signed)
Pt states she started having some swelling to the right side of her face 2 days ago that has progressively gotten worse since

## 2014-07-21 ENCOUNTER — Encounter (HOSPITAL_COMMUNITY): Payer: Self-pay | Admitting: *Deleted

## 2014-07-21 ENCOUNTER — Emergency Department (HOSPITAL_COMMUNITY)
Admission: EM | Admit: 2014-07-21 | Discharge: 2014-07-21 | Disposition: A | Discharge status: Home / Self Care | Payer: Self-pay | Attending: Emergency Medicine | Admitting: Emergency Medicine

## 2014-07-21 DIAGNOSIS — Z8719 Personal history of other diseases of the digestive system: Secondary | ICD-10-CM | POA: Insufficient documentation

## 2014-07-21 DIAGNOSIS — Z791 Long term (current) use of non-steroidal anti-inflammatories (NSAID): Secondary | ICD-10-CM | POA: Insufficient documentation

## 2014-07-21 DIAGNOSIS — Z7952 Long term (current) use of systemic steroids: Secondary | ICD-10-CM | POA: Insufficient documentation

## 2014-07-21 DIAGNOSIS — K088 Other specified disorders of teeth and supporting structures: Secondary | ICD-10-CM | POA: Insufficient documentation

## 2014-07-21 DIAGNOSIS — K047 Periapical abscess without sinus: Secondary | ICD-10-CM

## 2014-07-21 DIAGNOSIS — K029 Dental caries, unspecified: Secondary | ICD-10-CM

## 2014-07-21 DIAGNOSIS — Z7982 Long term (current) use of aspirin: Secondary | ICD-10-CM | POA: Insufficient documentation

## 2014-07-21 DIAGNOSIS — Z720 Tobacco use: Secondary | ICD-10-CM | POA: Insufficient documentation

## 2014-07-21 DIAGNOSIS — Z792 Long term (current) use of antibiotics: Secondary | ICD-10-CM | POA: Insufficient documentation

## 2014-07-21 MED ORDER — IBUPROFEN 600 MG PO TABS: 600.0000 mg | ORAL_TABLET | Freq: Four times a day (QID) | ORAL | Status: DC | PRN

## 2014-07-21 MED ORDER — CLINDAMYCIN HCL 150 MG PO CAPS: 150.0000 mg | ORAL_CAPSULE | Freq: Four times a day (QID) | ORAL | Status: DC

## 2014-07-21 MED ORDER — TRAMADOL HCL 50 MG PO TABS: 50.0000 mg | ORAL_TABLET | Freq: Four times a day (QID) | ORAL | Status: DC | PRN

## 2014-07-21 NOTE — Discharge Instructions (Signed)

## 2014-07-21 NOTE — ED Provider Notes (Signed)
CSN: [id number]     Arrival date & time 07/21/14  5697 History   First MD Initiated Contact with Patient 07/21/14 [phone]     Chief Complaint  Patient presents with  . Dental Pain     (Consider location/radiation/quality/duration/timing/severity/associated sxs/prior Treatment) HPI Pt is a 54yo female c/o right upper jaw pain that started last night. Pain is constant and throbbing, 8/10 at worst. Moderate relief with Aleve. States she thinks she has a dental abscess as she has had them in the past. Denies needing to have them be cut open in the past.  She does not have a dentist. Denies fever, n/v/d.  Past Medical History  Diagnosis Date  . Diverticulitis   . Seasonal allergies    Past Surgical History  Procedure Laterality Date  . Appendectomy     Family History  Problem Relation Age of Onset  . Cancer Other    History  Substance Use Topics  . Smoking status: Current Every Day Smoker -- 0.50 packs/day for 25 years    Types: Cigarettes  . Smokeless tobacco: Not on file  . Alcohol Use: Yes     Comment: occasionally   OB History    No data available     Review of Systems  Constitutional: Negative for fever and chills.  HENT: Positive for dental problem. Negative for sore throat, trouble swallowing and voice change.   Respiratory: Negative for cough and shortness of breath.   Gastrointestinal: Negative for nausea, vomiting and diarrhea.  All other systems reviewed and are negative.     Allergies  Review of patient's allergies indicates no known allergies.  Home Medications   Prior to Admission medications   Medication Sig Start Date End Date Taking? Authorizing Provider  naproxen sodium (ANAPROX) 220 MG tablet Take 660 mg by mouth 2 (two) times daily with a meal.   Yes Historical Provider, MD  aspirin EC 81 MG tablet Take 81 mg by mouth daily.    Historical Provider, MD  cetirizine (ZYRTEC) 10 MG tablet Take 10 mg by mouth daily.    Historical Provider, MD    clindamycin (CLEOCIN) 150 MG capsule Take 1 capsule (150 mg total) by mouth every 6 (six) hours. 07/21/14   Noland Fordyce, PA-C  ibuprofen (ADVIL,MOTRIN) 600 MG tablet Take 1 tablet (600 mg total) by mouth every 6 (six) hours as needed. 07/21/14   Noland Fordyce, PA-C  penicillin v potassium (VEETID) 500 MG tablet Take 1 tablet (500 mg total) by mouth 4 (four) times daily. Patient not taking: Reported on 07/21/2014 04/23/14   Tanna Furry, MD  predniSONE (DELTASONE) 10 MG tablet Take 2 tablets (20 mg total) by mouth daily. Patient not taking: Reported on 07/21/2014 04/23/14   Tanna Furry, MD  traMADol (ULTRAM) 50 MG tablet Take 1 tablet (50 mg total) by mouth every 6 (six) hours as needed. 07/21/14   Noland Fordyce, PA-C   BP 170/108 mmHg  Pulse 91  Temp(Src) 97.5 F (36.4 C) (Oral)  Resp 18  Ht 5\' 4"  (1.626 m)  Wt 128 lb (58.06 kg)  BMI 21.96 kg/m2  SpO2 96% Physical Exam  Constitutional: She is oriented to person, place, and time. She appears well-developed and well-nourished.  HENT:  Head: Normocephalic and atraumatic.  Mouth/Throat: Uvula is midline, oropharynx is clear and moist and mucous membranes are normal. No trismus in the jaw. Abnormal dentition. Dental abscesses and dental caries present. No uvula swelling.    Multiple dental caries and missing teeth.  Tenderness  to buccal mucosa and gingiva or right upper jaw line.  Severe cavity with the beginning of dental decay of right lower last molar.  No apical gingival abscess. No discharge or bleeding.  Eyes: EOM are normal.  Neck: Normal range of motion.  Cardiovascular: Normal rate.   Pulmonary/Chest: Effort normal.  Musculoskeletal: Normal range of motion.  Neurological: She is alert and oriented to person, place, and time.  Skin: Skin is warm and dry.  Psychiatric: She has a normal mood and affect. Her behavior is normal.  Nursing note and vitals reviewed.   ED Course  Procedures (including critical care time) Labs  Review Labs Reviewed - No data to display  Imaging Review No results found.   EKG Interpretation None      MDM   Final diagnoses:  Dental abscess  Pain due to dental caries   Pt with multiple dental caries c/o right upper dental pain last night. On exam, concern for onset of dental abscess.  No apical abscess present for I&D at this time. Will tx with clindamycin. Tramadol and ibuprofen for pain. Home care instructions provided. Advised to f/u with Dr. Mariel Sleet, [place].  Return precautions provided. Pt verbalized understanding and agreement with tx plan.   Noland Fordyce, PA-C 07/21/14 [place], MD 07/21/14 [phone]

## 2014-07-21 NOTE — ED Notes (Signed)
Pt states that she began to have discomfort to her rt jaw last pm; pt states she woke up with throbbing ans swelling this am; pt with multiple dental caries and c/o pain to rt upper jaw

## 2014-07-22 ENCOUNTER — Encounter (HOSPITAL_COMMUNITY): Payer: Self-pay | Admitting: Emergency Medicine

## 2014-07-22 ENCOUNTER — Emergency Department (HOSPITAL_COMMUNITY)
Admission: EM | Admit: 2014-07-22 | Discharge: 2014-07-22 | Disposition: A | Discharge status: Home / Self Care | Payer: Self-pay | Attending: Emergency Medicine | Admitting: Emergency Medicine

## 2014-07-22 DIAGNOSIS — Z79899 Other long term (current) drug therapy: Secondary | ICD-10-CM | POA: Insufficient documentation

## 2014-07-22 DIAGNOSIS — K047 Periapical abscess without sinus: Secondary | ICD-10-CM | POA: Insufficient documentation

## 2014-07-22 DIAGNOSIS — K5792 Diverticulitis of intestine, part unspecified, without perforation or abscess without bleeding: Secondary | ICD-10-CM | POA: Insufficient documentation

## 2014-07-22 DIAGNOSIS — K029 Dental caries, unspecified: Secondary | ICD-10-CM | POA: Insufficient documentation

## 2014-07-22 DIAGNOSIS — Z791 Long term (current) use of non-steroidal anti-inflammatories (NSAID): Secondary | ICD-10-CM | POA: Insufficient documentation

## 2014-07-22 DIAGNOSIS — Z720 Tobacco use: Secondary | ICD-10-CM | POA: Insufficient documentation

## 2014-07-22 DIAGNOSIS — Z7982 Long term (current) use of aspirin: Secondary | ICD-10-CM | POA: Insufficient documentation

## 2014-07-22 MED ORDER — HYDROCODONE-ACETAMINOPHEN 5-325 MG PO TABS: 1.0000 | ORAL_TABLET | Freq: Four times a day (QID) | ORAL | Status: DC | PRN

## 2014-07-22 MED ORDER — BUPIVACAINE HCL 0.5 % IJ SOLN: 50.0000 mL | Freq: Once | INTRAMUSCULAR | Status: DC

## 2014-07-22 MED ORDER — BUPIVACAINE-EPINEPHRINE (PF) 0.5% -1:200000 IJ SOLN
1.8000 mL | Freq: Once | INTRAMUSCULAR | Status: AC
  Administered 2014-07-22: 1.8 mL
  Filled 2014-07-22: qty 1.8

## 2014-07-22 MED ORDER — ONDANSETRON HCL 4 MG PO TABS: 4.0000 mg | ORAL_TABLET | Freq: Four times a day (QID) | ORAL | Status: DC

## 2014-07-22 NOTE — ED Provider Notes (Addendum)
CSN: [id number]     Arrival date & time 07/22/14  0906 History   First MD Initiated Contact with Patient 07/22/14 [phone]     Chief Complaint  Patient presents with  . Abscess     (Consider location/radiation/quality/duration/timing/severity/associated sxs/prior Treatment) Patient is a 53 y.o. female presenting with abscess. The history is provided by the patient.  Abscess Location:  Mouth Mouth abscess location:  Upper gingiva Abscess quality: induration, painful and redness   Duration:  3 days Progression:  Worsening Pain details:    Quality:  Throbbing and sharp   Severity:  Severe   Timing:  Constant   Progression:  Worsening Chronicity:  Recurrent Context: not diabetes and not injected drug use   Context comment:  Multiple dental caries Relieved by:  Nothing Exacerbated by: hot and cold liquids. Associated symptoms: no anorexia, no fever, no nausea and no vomiting     Past Medical History  Diagnosis Date  . Diverticulitis   . Seasonal allergies    Past Surgical History  Procedure Laterality Date  . Appendectomy     Family History  Problem Relation Age of Onset  . Cancer Other    History  Substance Use Topics  . Smoking status: Current Every Day Smoker -- 0.50 packs/day for 25 years    Types: Cigarettes  . Smokeless tobacco: Not on file  . Alcohol Use: Yes     Comment: occasionally   OB History    No data available     Review of Systems  Constitutional: Negative for fever.  Gastrointestinal: Negative for nausea, vomiting and anorexia.  All other systems reviewed and are negative.     Allergies  Review of patient's allergies indicates no known allergies.  Home Medications   Prior to Admission medications   Medication Sig Start Date End Date Taking? Authorizing Provider  naproxen sodium (ANAPROX) 220 MG tablet Take 660 mg by mouth 2 (two) times daily with a meal.   Yes Historical Provider, MD  aspirin EC 81 MG tablet Take 81 mg by mouth daily.     Historical Provider, MD  cetirizine (ZYRTEC) 10 MG tablet Take 10 mg by mouth daily.    Historical Provider, MD  clindamycin (CLEOCIN) 150 MG capsule Take 1 capsule (150 mg total) by mouth every 6 (six) hours. 07/21/14   Noland Fordyce, PA-C  ibuprofen (ADVIL,MOTRIN) 600 MG tablet Take 1 tablet (600 mg total) by mouth every 6 (six) hours as needed. 07/21/14   Noland Fordyce, PA-C  penicillin v potassium (VEETID) 500 MG tablet Take 1 tablet (500 mg total) by mouth 4 (four) times daily. Patient not taking: Reported on 07/21/2014 04/23/14   Tanna Furry, MD  predniSONE (DELTASONE) 10 MG tablet Take 2 tablets (20 mg total) by mouth daily. Patient not taking: Reported on 07/21/2014 04/23/14   Tanna Furry, MD  traMADol (ULTRAM) 50 MG tablet Take 1 tablet (50 mg total) by mouth every 6 (six) hours as needed. 07/21/14   Noland Fordyce, PA-C   BP 157/90 mmHg  Pulse 88  Temp(Src) 98.7 F (37.1 C) (Oral)  Resp 20  SpO2 99% Physical Exam  Constitutional: She is oriented to person, place, and time.  HENT:  Head: Normocephalic and atraumatic.  Mouth/Throat: Dental abscesses and dental caries present.    Eyes: EOM are normal. Pupils are equal, round, and reactive to light.  Neck: Normal range of motion. Neck supple.  Cardiovascular: Normal rate.   Pulmonary/Chest: Effort normal.  Lymphadenopathy:    She has  no cervical adenopathy.  Neurological: She is alert and oriented to person, place, and time.  Skin: Skin is warm and dry. No rash noted. No erythema.  Nursing note and vitals reviewed.   ED Course  Procedures (including critical care time) Labs Review Labs Reviewed - No data to display  Imaging Review No results found.   EKG Interpretation None      INCISION AND DRAINAGE Performed by: Blanchie Dessert Consent: Verbal consent obtained. Risks and benefits: risks, benefits and alternatives were discussed Type: abscess  Body area: right upper 1st molar dental abscess  Anesthesia: local  infiltration  Incision was made with a scalpel.  Local anesthetic: bupivacaine 0.5% with epinephrine  Anesthetic total: 1 ml  Complexity: simple Drainage: purulent  Drainage amount: moderate pus drained  Packing material: none Patient tolerance: Patient tolerated the procedure well with no immediate complications.     MDM   Final diagnoses:  Dental abscess    Pt with dental caries and facial swelling with dental abscess.  No signs of ludwig's angina or difficulty swallowing and no systemic symptoms. Will continue clinda and have pt f/u with dentist.    Blanchie Dessert, MD 07/22/14 [place], MD 07/22/14 1003

## 2014-07-22 NOTE — ED Notes (Addendum)
Per pt was seen here yesterday for tooth abscess. Pt reports has taken antibotics as prescribed and aleve for pain. Last dose of aleve at 2000 last night. Pt reports worsening pain and swelling to right side of face.

## 2014-07-22 NOTE — Discharge Instructions (Signed)

## 2014-07-23 ENCOUNTER — Encounter (HOSPITAL_COMMUNITY): Payer: Self-pay | Admitting: Emergency Medicine

## 2014-07-23 ENCOUNTER — Emergency Department (HOSPITAL_COMMUNITY)
Admission: EM | Admit: 2014-07-23 | Discharge: 2014-07-23 | Disposition: A | Discharge status: Home / Self Care | Payer: Self-pay | Attending: Emergency Medicine | Admitting: Emergency Medicine

## 2014-07-23 DIAGNOSIS — Z791 Long term (current) use of non-steroidal anti-inflammatories (NSAID): Secondary | ICD-10-CM | POA: Insufficient documentation

## 2014-07-23 DIAGNOSIS — K047 Periapical abscess without sinus: Secondary | ICD-10-CM | POA: Insufficient documentation

## 2014-07-23 DIAGNOSIS — Z720 Tobacco use: Secondary | ICD-10-CM | POA: Insufficient documentation

## 2014-07-23 MED ORDER — LIDOCAINE-EPINEPHRINE 2 %-1:100000 IJ SOLN
20.0000 mL | Freq: Once | INTRAMUSCULAR | Status: AC
  Administered 2014-07-23: 20 mL
  Filled 2014-07-23: qty 1

## 2014-07-23 NOTE — Discharge Instructions (Signed)

## 2014-07-23 NOTE — ED Notes (Signed)
Per pt, states upper right tooth abscess for 3 days-has been seen here and been on antibiotics but states they are working-increased pain

## 2014-07-23 NOTE — ED Provider Notes (Signed)
CSN: [id number]     Arrival date & time 07/23/14  2992 History   First MD Initiated Contact with Patient 07/23/14 [phone]     Chief Complaint  Patient presents with  . tooth abscess      (Consider location/radiation/quality/duration/timing/severity/associated sxs/prior Treatment) HPI Comments: Patient complains of pain and swelling to the right side of her face. Seen here recently for a tooth abscess which was drained and reviewed that note states to moderate pus was obtained. No fever or chills. No trouble with eye movement. Pain characterized as sharp and constant. She denies any intraoral drainage at this time. States that she has been compliant with the clindamycin  The history is provided by the patient.    Past Medical History  Diagnosis Date  . Diverticulitis   . Seasonal allergies    Past Surgical History  Procedure Laterality Date  . Appendectomy     Family History  Problem Relation Age of Onset  . Cancer Other    History  Substance Use Topics  . Smoking status: Current Every Day Smoker -- 0.50 packs/day for 25 years    Types: Cigarettes  . Smokeless tobacco: Not on file  . Alcohol Use: Yes     Comment: occasionally   OB History    No data available     Review of Systems  All other systems reviewed and are negative.     Allergies  Review of patient's allergies indicates no known allergies.  Home Medications   Prior to Admission medications   Medication Sig Start Date End Date Taking? Authorizing Provider  cetirizine (ZYRTEC) 10 MG tablet Take 10 mg by mouth daily as needed for allergies.     Historical Provider, MD  clindamycin (CLEOCIN) 150 MG capsule Take 1 capsule (150 mg total) by mouth every 6 (six) hours. 07/21/14   Noland Fordyce, PA-C  HYDROcodone-acetaminophen (NORCO/VICODIN) 5-325 MG per tablet Take 1-2 tablets by mouth every 6 (six) hours as needed for moderate pain or severe pain. 07/22/14   Blanchie Dessert, MD  ibuprofen (ADVIL,MOTRIN) 600  MG tablet Take 1 tablet (600 mg total) by mouth every 6 (six) hours as needed. Patient taking differently: Take 600 mg by mouth every 6 (six) hours as needed for moderate pain.  07/21/14   Noland Fordyce, PA-C  naproxen sodium (ANAPROX) 220 MG tablet Take 440 mg by mouth 2 (two) times daily with a meal.     Historical Provider, MD  ondansetron (ZOFRAN) 4 MG tablet Take 1 tablet (4 mg total) by mouth every 6 (six) hours. 07/22/14   Blanchie Dessert, MD  penicillin v potassium (VEETID) 500 MG tablet Take 1 tablet (500 mg total) by mouth 4 (four) times daily. Patient not taking: Reported on 07/21/2014 04/23/14   Tanna Furry, MD  predniSONE (DELTASONE) 10 MG tablet Take 2 tablets (20 mg total) by mouth daily. Patient not taking: Reported on 07/21/2014 04/23/14   Tanna Furry, MD  traMADol (ULTRAM) 50 MG tablet Take 1 tablet (50 mg total) by mouth every 6 (six) hours as needed. Patient taking differently: Take 50 mg by mouth every 6 (six) hours as needed for moderate pain.  07/21/14   Noland Fordyce, PA-C   BP 191/93 mmHg  Pulse 92  Temp(Src) 98.2 F (36.8 C) (Oral)  Resp 18  SpO2 100% Physical Exam  Constitutional: She is oriented to person, place, and time. She appears well-developed and well-nourished.  Non-toxic appearance. No distress.  HENT:  Head: Normocephalic and atraumatic.  Mouth/Throat:  Eyes: Conjunctivae, EOM and lids are normal. Pupils are equal, round, and reactive to light.  Neck: Normal range of motion. Neck supple. No tracheal deviation present. No thyroid mass present.  Cardiovascular: Normal rate, regular rhythm and normal heart sounds.  Exam reveals no gallop.   No murmur heard. Pulmonary/Chest: Effort normal and breath sounds normal. No stridor. No respiratory distress. She has no decreased breath sounds. She has no wheezes. She has no rhonchi. She has no rales.  Abdominal: Soft. Normal appearance and bowel sounds are normal. She exhibits no distension. There is no  tenderness. There is no rebound and no CVA tenderness.  Musculoskeletal: Normal range of motion. She exhibits no edema or tenderness.  Neurological: She is alert and oriented to person, place, and time. She has normal strength. No cranial nerve deficit or sensory deficit. GCS eye subscore is 4. GCS verbal subscore is 5. GCS motor subscore is 6.  Skin: Skin is warm and dry. No abrasion and no rash noted.  Psychiatric: She has a normal mood and affect. Her speech is normal and behavior is normal.  Nursing note and vitals reviewed.   ED Course  Procedures (including critical care time) Labs Review Labs Reviewed - No data to display  Imaging Review No results found.   EKG Interpretation None      MDM   Final diagnoses:  None    INCISION AND DRAINAGE Performed by: Leota Jacobsen Consent: Verbal consent obtained. Risks and benefits: risks, benefits and alternatives were discussed Time out pre-procedure performed Type: abscess  Body area: tooth  Anesthesia: local infiltration  Incision was made with a scalpel.  Local anesthetic: lidocaine 2% with epinephrine  Anesthetic total: 6 ml  Complexity: complex Blunt dissection to break up loculations  Drainage: purulent  Drainage amount: mild  Packing material:   Patient tolerance: Patient tolerated the procedure well with no immediate complications.   Patient to continue on her clindamycin and follow-up with her dentist    Leota Jacobsen, MD 07/23/14 [phone]

## 2014-07-23 NOTE — ED Notes (Signed)
Zenia Resides MD at bedside performing I&D.

## 2014-07-23 NOTE — ED Notes (Addendum)
Pt seen here yesterday for same. Pt had site drained yesterday with immediate relief. Pt reports increased swelling at site and pain throughout the night.

## 2015-06-03 ENCOUNTER — Emergency Department (HOSPITAL_COMMUNITY)
Admission: EM | Admit: 2015-06-03 | Discharge: 2015-06-03 | Disposition: A | Discharge status: Home / Self Care | Payer: Self-pay | Attending: Emergency Medicine | Admitting: Emergency Medicine

## 2015-06-03 ENCOUNTER — Encounter (HOSPITAL_COMMUNITY): Payer: Self-pay | Admitting: Emergency Medicine

## 2015-06-03 DIAGNOSIS — F17210 Nicotine dependence, cigarettes, uncomplicated: Secondary | ICD-10-CM | POA: Insufficient documentation

## 2015-06-03 DIAGNOSIS — Z8719 Personal history of other diseases of the digestive system: Secondary | ICD-10-CM | POA: Insufficient documentation

## 2015-06-03 DIAGNOSIS — I809 Phlebitis and thrombophlebitis of unspecified site: Secondary | ICD-10-CM

## 2015-06-03 DIAGNOSIS — I808 Phlebitis and thrombophlebitis of other sites: Secondary | ICD-10-CM | POA: Insufficient documentation

## 2015-06-03 DIAGNOSIS — Z792 Long term (current) use of antibiotics: Secondary | ICD-10-CM | POA: Insufficient documentation

## 2015-06-03 DIAGNOSIS — Z791 Long term (current) use of non-steroidal anti-inflammatories (NSAID): Secondary | ICD-10-CM | POA: Insufficient documentation

## 2015-06-03 MED ORDER — IBUPROFEN 800 MG PO TABS: 800.0000 mg | ORAL_TABLET | Freq: Three times a day (TID) | ORAL | Status: DC

## 2015-06-03 NOTE — ED Provider Notes (Signed)
CSN: [id number]     Arrival date & time 06/03/15  2051 History  By signing my name below, I, Irene Pap, attest that this documentation has been prepared under the direction and in the presence of Charlann Lange, PA-C. Electronically Signed: Irene Pap, ED Scribe. 06/03/2015. 9:32 PM.  Chief Complaint  Patient presents with  . Arm Pain    left   The history is provided by the patient. No language interpreter was used.  HPI Comments: Valerie Russell is a 54 y.o. female with hx of wrist injury who presents to the Emergency Department complaining of left arm pain onset 2 days ago. She states that she "jabbed" her left hand with a fork 3 weeks ago but did not see any symptoms. She states that yesterday she noticed bruising radiating up her arm with swelling that radiates right past her wrist. She states that her pain is central to the dorsum of the wrist. She denies numbness, weakness, fever, or chills.   Past Medical History  Diagnosis Date  . Diverticulitis   . Seasonal allergies    Past Surgical History  Procedure Laterality Date  . Appendectomy     Family History  Problem Relation Age of Onset  . Cancer Other    Social History  Substance Use Topics  . Smoking status: Current Every Day Smoker -- 0.50 packs/day for 25 years    Types: Cigarettes  . Smokeless tobacco: None  . Alcohol Use: Yes     Comment: occasionally   OB History    No data available     Review of Systems  Constitutional: Negative for fever and chills.  Musculoskeletal: Positive for joint swelling and arthralgias.  Skin: Positive for wound.  Neurological: Negative for weakness and numbness.   Allergies  Review of patient's allergies indicates no known allergies.  Home Medications   Prior to Admission medications   Medication Sig Start Date End Date Taking? Authorizing Provider  cetirizine (ZYRTEC) 10 MG tablet Take 10 mg by mouth daily as needed for allergies.     Historical Provider, MD   clindamycin (CLEOCIN) 150 MG capsule Take 1 capsule (150 mg total) by mouth every 6 (six) hours. 07/21/14   Noland Fordyce, PA-C  HYDROcodone-acetaminophen (NORCO/VICODIN) 5-325 MG per tablet Take 1-2 tablets by mouth every 6 (six) hours as needed for moderate pain or severe pain. 07/22/14   Blanchie Dessert, MD  ibuprofen (ADVIL,MOTRIN) 600 MG tablet Take 1 tablet (600 mg total) by mouth every 6 (six) hours as needed. Patient taking differently: Take 600 mg by mouth every 6 (six) hours as needed for moderate pain.  07/21/14   Noland Fordyce, PA-C  naproxen sodium (ANAPROX) 220 MG tablet Take 440 mg by mouth 2 (two) times daily with a meal.     Historical Provider, MD  ondansetron (ZOFRAN) 4 MG tablet Take 1 tablet (4 mg total) by mouth every 6 (six) hours. Patient taking differently: Take 4 mg by mouth every 6 (six) hours as needed for nausea or vomiting.  07/22/14   Blanchie Dessert, MD  penicillin v potassium (VEETID) 500 MG tablet Take 1 tablet (500 mg total) by mouth 4 (four) times daily. Patient not taking: Reported on 07/21/2014 04/23/14   Tanna Furry, MD  predniSONE (DELTASONE) 10 MG tablet Take 2 tablets (20 mg total) by mouth daily. Patient not taking: Reported on 07/21/2014 04/23/14   Tanna Furry, MD  traMADol (ULTRAM) 50 MG tablet Take 1 tablet (50 mg total) by mouth every 6 (six)  hours as needed. Patient taking differently: Take 50 mg by mouth every 6 (six) hours as needed for moderate pain.  07/21/14   Noland Fordyce, PA-C   BP 135/82 mmHg  Pulse 97  Temp(Src) 98.2 F (36.8 C) (Oral)  Resp 20  SpO2 98%  Physical Exam  Constitutional: She is oriented to person, place, and time. She appears well-developed and well-nourished.  HENT:  Head: Normocephalic and atraumatic.  Eyes: EOM are normal.  Neck: Normal range of motion. Neck supple.  Cardiovascular: Normal rate.   Pulmonary/Chest: Effort normal.  Musculoskeletal: Normal range of motion.  Left wrist has minimal dorsal swelling;  full ROM; NVI; left upper arm has palpable, minimally tender cord consistent with superficial thrombophlebitis; radial pulses intact  Neurological: She is alert and oriented to person, place, and time.  Skin: Skin is warm and dry.  Well healed puncture wound of dorsum left hand at first IP space  Psychiatric: She has a normal mood and affect. Her behavior is normal.  Nursing note and vitals reviewed.   ED Course  Procedures (including critical care time) DIAGNOSTIC STUDIES: Oxygen Saturation is 98% on RA, normal by my interpretation.    COORDINATION OF CARE: 9:32 PM-Discussed treatment plan which includes warm compresses, anti-inflammatories with pt at bedside and pt agreed to plan.   Labs Review Labs Reviewed - No data to display  Imaging Review No results found. I have personally reviewed and evaluated these images and lab results as part of my medical decision-making.   EKG Interpretation None      MDM   Final diagnoses:  None    1. Superficial thrombophlebitis  Treatment measures discussed with the patient. Doubt acute infection or cellulitis.   I personally performed the services described in this documentation, which was scribed in my presence. The recorded information has been reviewed and is accurate.     Charlann Lange, PA-C 06/06/15 [place], MD 06/20/15 [phone]

## 2015-06-03 NOTE — Discharge Instructions (Signed)
Phlebitis Phlebitis is soreness and swelling (inflammation) of a vein. This can occur in your arms, legs, or torso (trunk), as well as deeper inside your body. Phlebitis is usually not serious when it occurs close to the surface of the body. However, it can cause serious problems when it occurs in a vein deeper inside the body. CAUSES  Phlebitis can be triggered by various things, including:   Reduced blood flow through your veins. This can happen with:  Bed rest over a long period.  Long-distance travel.  Injury.  Surgery.  Being overweight (obese) or pregnant.  Having an IV tube put in the vein and getting certain medicines through the vein.  Cancer and cancer treatment.  Use of illegal drugs taken through the vein.  Inflammatory diseases.  Inherited (genetic) diseases that increase the risk of blood clots.  Hormone therapy, such as birth control pills. SIGNS AND SYMPTOMS   Red, tender, swollen, and painful area on your skin. Usually, the area will be long and narrow.  Firmness along the center of the affected area. This can indicate that a blood clot has formed.  Low-grade fever. DIAGNOSIS  A health care provider can usually diagnose phlebitis by examining the affected area and asking about your symptoms. To check for infection or blood clots, your health care provider may order blood tests or an ultrasound exam of the area. Blood tests and your family history may also indicate if you have an underlying genetic disease that causes blood clots. Occasionally, a piece of tissue is taken from the body (biopsy sample) if an unusual cause of phlebitis is suspected. TREATMENT  Treatment will vary depending on the severity of the condition and the area of the body affected. Treatment may include:  Use of a warm compress or heating pad.  Use of compression stockings or bandages.  Anti-inflammatory medicines.  Removal of any IV tube that may be causing the problem.  Medicines  that kill germs (antibiotics) if an infection is present.  Blood-thinning medicines if a blood clot is suspected or present.  In rare cases, surgery may be needed to remove damaged sections of vein. HOME CARE INSTRUCTIONS   Only take over-the-counter or prescription medicines as directed by your health care provider. Take all medicines exactly as prescribed.  Raise (elevate) the affected area above the level of your heart as directed by your health care provider.  Apply a warm compress or heating pad to the affected area as directed by your health care provider. Do not sleep with the heating pad.  Use compression stockings or bandages as directed. These will speed healing and prevent the condition from coming back.  If you are on blood thinners:  Get follow-up blood tests as directed by your health care provider.  Check with your health care provider before using any new medicines.  Carry a medical alert card or wear your medical alert jewelry to show that you are on blood thinners.  For phlebitis in the legs:  Avoid prolonged standing or bed rest.  Keep your legs moving. Raise your legs when sitting or lying.  Do not smoke.  Women, particularly those over the age of 59, should consider the risks and benefits of taking the contraceptive pill. This kind of hormone treatment can increase your risk for blood clots.  Follow up with your health care provider as directed. SEEK MEDICAL CARE IF:   You have unusual bruising or any bleeding problems.  Your swelling or pain in the affected area  is not improving.  You are on anti-inflammatory medicine, and you develop belly (abdominal) pain. SEEK IMMEDIATE MEDICAL CARE IF:   You have a sudden onset of chest pain or difficulty breathing.  You have a fever or persistent symptoms for more than 2-3 days.  You have a fever and your symptoms suddenly get worse. MAKE SURE YOU:  Understand these instructions.  Will watch your  condition.  Will get help right away if you are not doing well or get worse.   This information is not intended to replace advice given to you by your health care provider. Make sure you discuss any questions you have with your health care provider.   Document Released: 07/11/2001 Document Revised: 05/07/2013 Document Reviewed: 03/24/2013 Elsevier Interactive Patient Education 2016 [place] therapy can help ease sore, stiff, injured, and tight muscles and joints. Heat relaxes your muscles, which may help ease your pain.  RISKS AND COMPLICATIONS If you have any of the following conditions, do not use heat therapy unless your health care provider has approved:  Poor circulation.  Healing wounds or scarred skin in the area being treated.  Diabetes, heart disease, or high blood pressure.  Not being able to feel (numbness) the area being treated.  Unusual swelling of the area being treated.  Active infections.  Blood clots.  Cancer.  Inability to communicate pain. This may include young children and people who have problems with their brain function (dementia).  Pregnancy. Heat therapy should only be used on old, pre-existing, or long-lasting (chronic) injuries. Do not use heat therapy on new injuries unless directed by your health care provider. HOW TO USE HEAT THERAPY There are several different kinds of heat therapy, including:  Moist heat pack.  Warm water bath.  Hot water bottle.  Electric heating pad.  Heated gel pack.  Heated wrap.  Electric heating pad. Use the heat therapy method suggested by your health care provider. Follow your health care provider's instructions on when and how to use heat therapy. GENERAL HEAT THERAPY RECOMMENDATIONS  Do not sleep while using heat therapy. Only use heat therapy while you are awake.  Your skin may turn pink while using heat therapy. Do not use heat therapy if your skin turns red.  Do not use heat  therapy if you have new pain.  High heat or long exposure to heat can cause burns. Be careful when using heat therapy to avoid burning your skin.  Do not use heat therapy on areas of your skin that are already irritated, such as with a rash or sunburn. SEEK MEDICAL CARE IF:  You have blisters, redness, swelling, or numbness.  You have new pain.  Your pain is worse. MAKE SURE YOU:  Understand these instructions.  Will watch your condition.  Will get help right away if you are not doing well or get worse.   This information is not intended to replace advice given to you by your health care provider. Make sure you discuss any questions you have with your health care provider.   Document Released: 10/09/2011 Document Revised: 08/07/2014 Document Reviewed: 09/09/2013 Elsevier Interactive Patient Education [employer].

## 2015-06-03 NOTE — ED Notes (Signed)
Patient states she "jabbed her left hand with a fork" @ 3 weeks ago. Patient states she began having pain to the hand/wrist about 2 days ago. Patient states she can "feel knots" all the way up her arm. No knots visualized and palpated at this time. Bruising (yellow in color) to left upper arm noted.

## 2015-08-03 ENCOUNTER — Encounter (HOSPITAL_COMMUNITY): Payer: Self-pay | Admitting: Emergency Medicine

## 2015-08-03 ENCOUNTER — Emergency Department (HOSPITAL_COMMUNITY)
Admission: EM | Admit: 2015-08-03 | Discharge: 2015-08-03 | Disposition: A | Discharge status: Home / Self Care | Payer: Self-pay | Attending: Emergency Medicine | Admitting: Emergency Medicine

## 2015-08-03 DIAGNOSIS — K047 Periapical abscess without sinus: Secondary | ICD-10-CM | POA: Insufficient documentation

## 2015-08-03 DIAGNOSIS — R0981 Nasal congestion: Secondary | ICD-10-CM | POA: Insufficient documentation

## 2015-08-03 DIAGNOSIS — Z8719 Personal history of other diseases of the digestive system: Secondary | ICD-10-CM | POA: Insufficient documentation

## 2015-08-03 DIAGNOSIS — Z792 Long term (current) use of antibiotics: Secondary | ICD-10-CM | POA: Insufficient documentation

## 2015-08-03 DIAGNOSIS — IMO0001 Reserved for inherently not codable concepts without codable children: Secondary | ICD-10-CM

## 2015-08-03 DIAGNOSIS — Z791 Long term (current) use of non-steroidal anti-inflammatories (NSAID): Secondary | ICD-10-CM | POA: Insufficient documentation

## 2015-08-03 DIAGNOSIS — R030 Elevated blood-pressure reading, without diagnosis of hypertension: Secondary | ICD-10-CM | POA: Insufficient documentation

## 2015-08-03 DIAGNOSIS — Z79899 Other long term (current) drug therapy: Secondary | ICD-10-CM | POA: Insufficient documentation

## 2015-08-03 DIAGNOSIS — F17210 Nicotine dependence, cigarettes, uncomplicated: Secondary | ICD-10-CM | POA: Insufficient documentation

## 2015-08-03 MED ORDER — BUPIVACAINE-EPINEPHRINE (PF) 0.5% -1:200000 IJ SOLN
1.8000 mL | Freq: Once | INTRAMUSCULAR | Status: AC
  Administered 2015-08-03: 1.8 mL
  Filled 2015-08-03: qty 1.8

## 2015-08-03 MED ORDER — HYDROCODONE-ACETAMINOPHEN 5-325 MG PO TABS: ORAL_TABLET | ORAL | Status: DC

## 2015-08-03 MED ORDER — AMOXICILLIN 500 MG PO CAPS: 500.0000 mg | ORAL_CAPSULE | Freq: Three times a day (TID) | ORAL | Status: DC

## 2015-08-03 MED ORDER — AMOXICILLIN 500 MG PO CAPS
1000.0000 mg | ORAL_CAPSULE | Freq: Once | ORAL | Status: AC
  Administered 2015-08-03: 1000 mg via ORAL
  Filled 2015-08-03: qty 2

## 2015-08-03 NOTE — ED Notes (Signed)
Bed: WA27 Expected date:  Expected time:  Means of arrival:  Comments: 

## 2015-08-03 NOTE — ED Notes (Signed)
Pt complaining of gum abscess and sinus congestion x 2 days. Small visible abscess to left upper gum, no drainage, poor dentition apparent.

## 2015-08-03 NOTE — ED Provider Notes (Signed)
CSN: [id number]     Arrival date & time 08/03/15  3716 History   First MD Initiated Contact with Patient 08/03/15 1013     Chief Complaint  Patient presents with  . Dental Pain  . Nasal Congestion     (Consider location/radiation/quality/duration/timing/severity/associated sxs/prior Treatment) HPI   Blood pressure 160/96, pulse 85, temperature 97.9 F (36.6 C), temperature source Oral, resp. rate 18, SpO2 97 %.  Valerie Russell is a 55 y.o. female complaining of  painful swelling to left upper jaw worsening over the course of 42days, patient rates her pain at 8 out of 10, exacerbated by movement, palpation. She does not have a dentist. She hasn't noticed any swelling or discharge. She also has pressure in the left maxillary area which she attributes to nasal congestion, no fever, chills or nasal discharge. No difficulty swallowing her secretions or swelling in the neck area.  Past Medical History  Diagnosis Date  . Diverticulitis   . Seasonal allergies    Past Surgical History  Procedure Laterality Date  . Appendectomy     Family History  Problem Relation Age of Onset  . Cancer Other    Social History  Substance Use Topics  . Smoking status: Current Every Day Smoker -- 0.50 packs/day for 25 years    Types: Cigarettes  . Smokeless tobacco: None  . Alcohol Use: Yes     Comment: occasionally   OB History    No data available     Review of Systems  10 systems reviewed and found to be negative, except as noted in the HPI.   Allergies  Review of patient's allergies indicates no known allergies.  Home Medications   Prior to Admission medications   Medication Sig Start Date End Date Taking? Authorizing Provider  cetirizine (ZYRTEC) 10 MG tablet Take 10 mg by mouth daily as needed for allergies.     Historical Provider, MD  clindamycin (CLEOCIN) 150 MG capsule Take 1 capsule (150 mg total) by mouth every 6 (six) hours. 07/21/14   Noland Fordyce, PA-C    HYDROcodone-acetaminophen (NORCO/VICODIN) 5-325 MG per tablet Take 1-2 tablets by mouth every 6 (six) hours as needed for moderate pain or severe pain. 07/22/14   Blanchie Dessert, MD  ibuprofen (ADVIL,MOTRIN) 800 MG tablet Take 1 tablet (800 mg total) by mouth 3 (three) times daily. 06/03/15   Charlann Lange, PA-C  naproxen sodium (ANAPROX) 220 MG tablet Take 440 mg by mouth 2 (two) times daily with a meal.     Historical Provider, MD  ondansetron (ZOFRAN) 4 MG tablet Take 1 tablet (4 mg total) by mouth every 6 (six) hours. Patient taking differently: Take 4 mg by mouth every 6 (six) hours as needed for nausea or vomiting.  07/22/14   Blanchie Dessert, MD  penicillin v potassium (VEETID) 500 MG tablet Take 1 tablet (500 mg total) by mouth 4 (four) times daily. Patient not taking: Reported on 07/21/2014 04/23/14   Tanna Furry, MD  predniSONE (DELTASONE) 10 MG tablet Take 2 tablets (20 mg total) by mouth daily. Patient not taking: Reported on 07/21/2014 04/23/14   Tanna Furry, MD  traMADol (ULTRAM) 50 MG tablet Take 1 tablet (50 mg total) by mouth every 6 (six) hours as needed. Patient taking differently: Take 50 mg by mouth every 6 (six) hours as needed for moderate pain.  07/21/14   Noland Fordyce, PA-C   BP 160/96 mmHg  Pulse 85  Temp(Src) 97.9 F (36.6 C) (Oral)  Resp 18  SpO2  97% Physical Exam  Constitutional: She is oriented to person, place, and time. She appears well-developed and well-nourished. No distress.  HENT:  Head: Normocephalic.  Mouth/Throat: Uvula is midline and oropharynx is clear and moist. No trismus in the jaw. No uvula swelling. No oropharyngeal exudate, posterior oropharyngeal edema, posterior oropharyngeal erythema or tonsillar abscesses.    Generally poor dentition, no gingival swelling, erythema or tenderness to palpation. Patient is handling their secretions. There is no tenderness to palpation or firmness underneath tongue bilaterally. No trismus.    Eyes:  Conjunctivae and EOM are normal.  Cardiovascular: Normal rate.   Pulmonary/Chest: Effort normal. No stridor.  Musculoskeletal: Normal range of motion.  Lymphadenopathy:    She has no cervical adenopathy.  Neurological: She is alert and oriented to person, place, and time.  Psychiatric: She has a normal mood and affect.  Nursing note and vitals reviewed.   ED Course  .Marland KitchenIncision and Drainage Date/Time: 08/03/2015 11:04 AM Performed by: Monico Blitz Authorized by: Monico Blitz Consent: Verbal consent obtained. Consent given by: patient Patient identity confirmed: verbally with patient Type: abscess Body area: mouth Location details: alveolar process Anesthesia: nerve block Local anesthetic: bupivacaine 0.5% with epinephrine (Left posterior superior alveolar block) Anesthetic total: 1.8 ml Patient sedated: no Scalpel size: 11 Incision type: single straight Incision depth: dermal Complexity: complex Drainage: purulent and  bloody Drainage amount: moderate Wound treatment: wound left open Packing material: none Patient tolerance: Patient tolerated the procedure well with no immediate complications   (including critical care time) Labs Review Labs Reviewed - No data to display  Imaging Review No results found. I have personally reviewed and evaluated these images and lab results as part of my medical decision-making.   EKG Interpretation None      MDM   Final diagnoses:  None   Filed Vitals:   08/03/15 0957  BP: 160/96  Pulse: 85  Temp: 97.9 F (36.6 C)  TempSrc: Oral  Resp: 18  SpO2: 97%    Medications  bupivacaine-epinephrine (MARCAINE W/ EPI) 0.5% -1:200000 injection 1.8 mL (1.8 mLs Infiltration Given by Other 08/03/15 1037)  amoxicillin (AMOXIL) capsule 1,000 mg (1,000 mg Oral Given 08/03/15 1037)    Valerie Russell is 55 y.o. female presenting with dental pain and dental abscess, nerve block given and I and D performed. Patient counseled on wound  care and return precautions, case management consult to try to get this patient into primary care and dentistry. Her blood pressure is elevated today, may be secondary to pain. I've advised her she will need to have this rechecked her primary care of the next week.  Evaluation does not show pathology that would require ongoing emergent intervention or inpatient treatment. Pt is hemodynamically stable and mentating appropriately. Discussed findings and plan with patient/guardian, who agrees with care plan. All questions answered. Return precautions discussed and outpatient follow up given.   Discharge Medication List as of 08/03/2015 10:47 AM    START taking these medications   Details  amoxicillin (AMOXIL) 500 MG capsule Take 1 capsule (500 mg total) by mouth 3 (three) times daily., Starting 08/03/2015, Until Discontinued, Print            Monico Blitz, PA-C 08/03/15 1106  Leo Grosser, MD 08/05/15 [phone]

## 2015-08-03 NOTE — Discharge Instructions (Signed)
Eat a soft diet for the next 24-48 hours.  Rinse your mouth with saltwater or regular water after you eat any food.  Do not swallow blood or pus becuase it will cause you to vomit,  spit out instead.  Take vicodin for breakthrough pain, do not drink alcohol, drive, care for children or do other critical tasks while taking vicodin.  Return to the emergency room for fever, change in vision, redness to the face that rapidly spreads towards the eye, nausea or vomiting, difficulty swallowing or shortness of breath.   Apply warm compresses to jaw throughout the day.   Take your antibiotics as directed and to the end of the course. DO NOT drink alcohol when taking metronidazole, it will make you very sick!   Followup with a dentist is very important for ongoing evaluation and management of recurrent dental pain. Return to emergency department for emergent changing or worsening symptoms."  Low-cost dental clinic: Jonna Coup  at [phone]**  **Ladell Pier at [phone] [address]**    You may also call [phone]  Dental Assistance If the dentist on-call cannot see you, please use the resources below:   Patients with Medicaid: [facility] [phone] [address], [place] [address] [phone]  If unable to pay, or uninsured, contact [facility] [phone]) or [place] [phone] in [place], [place] in [facility] to become qualified for the adult dental clinic  Other [place]- [place], [place], [place], [address]    [phone], Ext. [phone]    2nd and 4th Thursday of the month at 6:30am    10 clients each day by appointment, can sometimes see walk-in     patients if someone does not show for an appointment [facility]- [address] Hillard Danker [place], [place], [address]    [phone] [facility]- [address], [place], [place],  [address]    [phone]  [place]   Emergency Department Resource Guide 1) Find a Doctor and Pay Out of Pocket Although you won't have to find out who is covered by your insurance plan, it is a good idea to ask around and get recommendations. You will then need to call the office and see if the doctor you have chosen will accept you as a new patient and what types of options they offer for patients who are self-pay. Some doctors offer discounts or will set up payment plans for their patients who do not have insurance, but you will need to ask so you aren't surprised when you get to your appointment.  2) Contact Your Local Health Department Not all health departments have doctors that can see patients for sick visits, but many do, so it is worth a call to see if yours does. If you don't know where your local health department is, you can check in your phone book. The CDC also has a tool to help you locate your state's health department, and many state websites also have listings of all of their local health departments.  3) Find a [facility] If your illness is not likely to be very severe or complicated, you may want to try a walk in clinic. These are popping up all over the country in pharmacies, drugstores, and shopping centers. They're usually staffed by nurse practitioners or physician assistants that have been trained to treat  common illnesses and complaints. They're usually fairly quick and inexpensive. However, if you have serious medical issues or chronic medical problems, these are probably not your best option.  No Primary Care Doctor: - Call Health Connect at  [phone] - they can help you locate a primary care doctor that  accepts your insurance, provides certain services, etc. - Physician Referral Service- [phone]  Chronic Pain Problems: Organization          Address  Phone   Notes  [facility]  [phone] Patients need to be referred by their primary care doctor.   Medication Assistance: Organization         Address  Phone   Notes  [facility] Medication [address] [place], [place] [address] [phone] --Must be a resident of [facility] -- Must have NO insurance coverage whatsoever (no Medicaid/ Medicare, etc.) -- The pt. MUST have a primary care doctor that directs their care regularly and follows them in the community   [facility]ist  [phone]   [employer]  [phone]    Agencies that provide inexpensive medical care: Organization         Address  Phone   Notes  [place]  [phone]   [facility]    [phone]   [facility] [place], [place] [address] [phone]   [place] [address] [place] [phone]   Planned Parenthood    [phone]   [facility]    [phone]   [place] and [place] [address], [place] Phone:  [phone], Fax:  [phone] Hours of Operation:  9 am - 6 pm, M-F.  Also accepts Medicaid/Medicare and self-pay.  [facility] for [place], [address], [place] Phone: [phone], Fax: [phone]. Hours of Operation:  8:30 am - 5:30 pm, M-F.  Also accepts Medicaid and self-pay.  [facility] [place] [address] [place] Phone: [phone]   [place], [place], [place] [phone], Ext. [phone] Mondays & Thursdays: 7-9 AM.  First 15 patients are seen on a first come, first serve basis.    [place] Providers:  Organization         Address  Phone   Notes  [facility] [address], [place] [phone] Also accepts self-pay patients.   [facility] [address], [place]  [phone]   [place], [address], [place] [phone]   [facility] [address] [place] [phone]   Lucianne Lei [address], [place]   [phone] Only accepts [place] Access [place] patients after they have their name applied to their card.   Self-Pay (no insurance) in [facility]:  Organization         Address  Phone   Notes  Sickle Cell Patients, [facility] [place] [phone]   [facility] [place] [phone]   [facility] [place]  [place], [address], [place] [phone]   [facility]/Dr. Osei-Bonsu  [address], [place] or [address], [address]een [address], [place] [phone] Phone number for both [facility] and [place] locations is the same.  [facility] [address], [place] [phone]   [facility] [address] [place] or [address] [phone]   [facility] [address] [place] [phone], phone; [phone], fax Sees patients 1st and 3rd Saturday of every month.  Must not qualify for public or private insurance (i.e. Medicaid, Medicare, [employer] Health Choice, Veterans' Benefits)  Household income should be no more than 200% of the poverty level The clinic cannot treat you if you are pregnant or think you are pregnant  Sexually transmitted diseases are not treated at the clinic.    Dental Care: Organization         Address  Phone  Notes  [facility] Department of [facility] [phone] Accepts children up to age 55 who are enrolled in [place] or [place]; pregnant women with a Medicaid  card; and children who have applied for Medicaid or [place] Health Choice, but were declined, whose parents can pay a reduced fee at time of service.  [facility] Department of [facility]  [address], [place] [phone] Accepts children up to age 4 who are enrolled in [place] or [place]; pregnant women with a Medicaid card; and children who have applied for Medicaid or [place] Health Choice, but were declined, whose parents can pay a reduced fee at time of service.  [place] Adult Dental Access PROGRAM  [place] [phone] Patients are seen by appointment only. Walk-ins are not accepted. [place] will see patients 72 years of age and older. Monday - Tuesday (8am-5pm) Most Wednesdays (8:30-5pm) $30 per visit, cash only  [facility] Adult Dental Access PROGRAM  [address], [facility] [phone] Patients are seen by appointment only. Walk-ins are not accepted. [place] will see patients 70 years of age and older. One Wednesday Evening (Monthly: Volunteer Based).  $30 per visit, cash only  [place]  [phone] for adults; Children under age 46, call Graduate Pediatric Dentistry at [phone]. Children aged 72-14, please call [phone] to request a pediatric application.  Dental services are provided in all areas of dental care including fillings, crowns and bridges, complete and partial dentures, implants, gum treatment, root canals, and extractions. Preventive care is also provided. Treatment is provided to both adults and children. Patients are selected via a lottery and there is often a waiting list.   [facility] [address] [place]  [phone] [email or web address]   [facility] [address] [place], [place] [phone], Ext. [phone] Second and Fourth Thursday of each month, opens at 6:30 AM; Clinic ends at 9 AM.  Patients are seen on a first-come  first-served basis, and a limited number are seen during each clinic.   [facility]  [address] [place]anker [place], [place] [phone]   Eligibility Requirements You must have lived in [place], [place], or [place] counties for at least the last three months.   You cannot be eligible for state or federal sponsored [employer], including [employer], [place], or [employer].   You generally cannot be eligible for healthcare insurance through your employer.    How to apply: Eligibility screenings are held every Tuesday  and Wednesday afternoon from 1:00 pm until 4:00 pm. You do not need an appointment for the interview!  [facility] [address], [place], [place]   [place] Department  [place]  [phone]    Behavioral Health Resources in the Community: Intensive Outpatient Programs Organization         Address  Phone  Notes  [place]. [address] [place], [place] [phone]   [facility] [address], [place], [place]   ADS: Alcohol & Drug Svcs [address] [place], [place]   [place] [address],  [place], [place] or [phone]   Substance Abuse Resources Organization         Address  Phone  Notes  Alcohol and Drug Services  [phone]   [place]  [phone]   [facility] [place]   Chinita Pester  [phone]   Residential & Outpatient Substance Abuse Program  [phone]   Psychological Services Organization         Address  Phone  Notes  [facility]  [place]  [phone]   [place] [address] [place] or [phone]    Mobile Crisis Teams Organization          Address  Phone  Notes  [facility], Mobile Crisis Care Unit  [phone]   [facility]  [address] [place], [place]   [place] [address], [place] [phone]    Self-Help/Support Groups Organization         Address  Phone             Notes  [place]. of [place] - variety of support groups  [place] Call for more information  Narcotics Anonymous (NA), [facility]aring [facility] [address], [employer] [place]  2 meetings at this location   Special educational needs teacher         Address  Phone  Notes  ASAP Residential Treatment [place],    [place]  1-7185188345   [facility]  [address] [place] [address], [place], [place]   [place], [place] [phone] Admissions: 8am-3pm M-F  Incentives Substance [place] [address]    [place], [place] [phone]   [facility] [address] [employer], [place], [place]   [facility] [address],  [place], [place]   [facility]ight Programs - Intensive Outpatient [address], [place], [place]   [facility] ([place] [place].,  [place], [place] [phone] or [phone]   [facility] Services (RTS) [address], [place], [place] Accepts Medicaid  [facility] [place] [address]  [place] [phone] Substance Abuse/Addiction Treatment   [facility] Organization         Address  Phone  Notes  [facility]  [phone]   Domenic Schwab, PhD [address] Arlis [place], [place]   [phone] or [phone]   Rodman Prue [place], [place] [phone]   [place] [address] [place] [address], [place], [place] [phone] Insurance/Medicaid/sponsorship  through [place]  Faith and Families [address] [place], [place] [phone] [place], [place] [phone]    Dr. Adele Schilder  [phone]   [facility] Dept. 1) [address], [place] 2) [place] 3)  [place] [address], [place] [phone]  [phone]   [place] [phone] or [phone] (After Hours)

## 2015-08-23 ENCOUNTER — Encounter (HOSPITAL_COMMUNITY): Payer: Self-pay | Admitting: Emergency Medicine

## 2015-08-23 ENCOUNTER — Emergency Department (HOSPITAL_COMMUNITY): Payer: Self-pay

## 2015-08-23 ENCOUNTER — Emergency Department (HOSPITAL_COMMUNITY)
Admission: EM | Admit: 2015-08-23 | Discharge: 2015-08-23 | Disposition: A | Discharge status: Home / Self Care | Payer: Self-pay | Attending: Emergency Medicine | Admitting: Emergency Medicine

## 2015-08-23 DIAGNOSIS — J069 Acute upper respiratory infection, unspecified: Secondary | ICD-10-CM | POA: Insufficient documentation

## 2015-08-23 DIAGNOSIS — F17210 Nicotine dependence, cigarettes, uncomplicated: Secondary | ICD-10-CM | POA: Insufficient documentation

## 2015-08-23 DIAGNOSIS — I10 Essential (primary) hypertension: Secondary | ICD-10-CM | POA: Insufficient documentation

## 2015-08-23 DIAGNOSIS — Z79899 Other long term (current) drug therapy: Secondary | ICD-10-CM | POA: Insufficient documentation

## 2015-08-23 DIAGNOSIS — Z792 Long term (current) use of antibiotics: Secondary | ICD-10-CM | POA: Insufficient documentation

## 2015-08-23 DIAGNOSIS — Z791 Long term (current) use of non-steroidal anti-inflammatories (NSAID): Secondary | ICD-10-CM | POA: Insufficient documentation

## 2015-08-23 DIAGNOSIS — F419 Anxiety disorder, unspecified: Secondary | ICD-10-CM | POA: Insufficient documentation

## 2015-08-23 DIAGNOSIS — Z8719 Personal history of other diseases of the digestive system: Secondary | ICD-10-CM | POA: Insufficient documentation

## 2015-08-23 LAB — URINE MICROSCOPIC-ADD ON

## 2015-08-23 LAB — URINALYSIS, ROUTINE W REFLEX MICROSCOPIC
Bilirubin Urine: NEGATIVE
Glucose, UA: NEGATIVE mg/dL
Ketones, ur: 15 mg/dL — AB
LEUKOCYTES UA: NEGATIVE
NITRITE: NEGATIVE
PH: 6 (ref 5.0–8.0)
Protein, ur: NEGATIVE mg/dL
SPECIFIC GRAVITY, URINE: 1.017 (ref 1.005–1.030)

## 2015-08-23 LAB — RAPID URINE DRUG SCREEN, HOSP PERFORMED
Amphetamines: NOT DETECTED
BARBITURATES: NOT DETECTED
BENZODIAZEPINES: NOT DETECTED
COCAINE: NOT DETECTED
OPIATES: NOT DETECTED
TETRAHYDROCANNABINOL: POSITIVE — AB

## 2015-08-23 LAB — CBC WITH DIFFERENTIAL/PLATELET
BASOS ABS: 0 10*3/uL (ref 0.0–0.1)
BASOS PCT: 1 %
Eosinophils Absolute: 0.1 10*3/uL (ref 0.0–0.7)
Eosinophils Relative: 1 %
HEMATOCRIT: 41 % (ref 36.0–46.0)
HEMOGLOBIN: 13.8 g/dL (ref 12.0–15.0)
Lymphocytes Relative: 32 %
Lymphs Abs: 2.4 10*3/uL (ref 0.7–4.0)
MCH: 33.9 pg (ref 26.0–34.0)
MCHC: 33.7 g/dL (ref 30.0–36.0)
MCV: 100.7 fL — ABNORMAL HIGH (ref 78.0–100.0)
Monocytes Absolute: 0.5 10*3/uL (ref 0.1–1.0)
Monocytes Relative: 7 %
NEUTROS PCT: 59 %
Neutro Abs: 4.6 10*3/uL (ref 1.7–7.7)
Platelets: 296 10*3/uL (ref 150–400)
RBC: 4.07 MIL/uL (ref 3.87–5.11)
RDW: 12.8 % (ref 11.5–15.5)
WBC: 7.6 10*3/uL (ref 4.0–10.5)

## 2015-08-23 LAB — COMPREHENSIVE METABOLIC PANEL
ALBUMIN: 4.5 g/dL (ref 3.5–5.0)
ALK PHOS: 90 U/L (ref 38–126)
ALT: 21 U/L (ref 14–54)
ANION GAP: 12 (ref 5–15)
AST: 25 U/L (ref 15–41)
BILIRUBIN TOTAL: 0.6 mg/dL (ref 0.3–1.2)
BUN: 15 mg/dL (ref 6–20)
CALCIUM: 9.5 mg/dL (ref 8.9–10.3)
CO2: 23 mmol/L (ref 22–32)
Chloride: 104 mmol/L (ref 101–111)
Creatinine, Ser: 0.78 mg/dL (ref 0.44–1.00)
Glucose, Bld: 96 mg/dL (ref 65–99)
Potassium: 4.5 mmol/L (ref 3.5–5.1)
SODIUM: 139 mmol/L (ref 135–145)
TOTAL PROTEIN: 7.9 g/dL (ref 6.5–8.1)

## 2015-08-23 MED ORDER — LISINOPRIL 5 MG PO TABS: 5.0000 mg | ORAL_TABLET | Freq: Every day | ORAL | Status: DC

## 2015-08-23 MED ORDER — CLONIDINE HCL 0.1 MG PO TABS
0.1000 mg | ORAL_TABLET | Freq: Once | ORAL | Status: AC
Start: 2015-08-23 — End: 2015-08-23
  Administered 2015-08-23: 0.1 mg via ORAL
  Filled 2015-08-23: qty 1

## 2015-08-23 MED ORDER — ACETAMINOPHEN 500 MG PO TABS: 1000.0000 mg | ORAL_TABLET | Freq: Four times a day (QID) | ORAL | Status: DC | PRN

## 2015-08-23 MED ORDER — LISINOPRIL 5 MG PO TABS
5.0000 mg | ORAL_TABLET | Freq: Once | ORAL | Status: AC
  Administered 2015-08-23: 5 mg via ORAL
  Filled 2015-08-23: qty 1

## 2015-08-23 NOTE — ED Notes (Signed)
Pt c/o erratic intermittent HTN onset 2 weeks ago after starting amoxicillin for sinus and tooth infection.

## 2015-08-23 NOTE — ED Notes (Signed)
Pt returned to room without distress noted.

## 2015-08-23 NOTE — ED Provider Notes (Signed)
CSN: [id number]     Arrival date & time 08/23/15  1100 History   First MD Initiated Contact with Patient 08/23/15 1426     Chief Complaint  Patient presents with  . Hypertension     (Consider location/radiation/quality/duration/timing/severity/associated sxs/prior Treatment) HPI Patient states that she is having elevated blood pressure. She does not routinely monitor her blood pressure. She reports she probably has injected in the year. She states in the past the upper number was around 140 and she doesn't remember the lower number. She thought that that number was okay. She reports she developed a sinus infection last week and has been taking amoxicillin. She reports she is still getting dizziness and feeling like she is getting ringing in ears and continues to have a lot of nasal congestion and drainage. She reports she just feels jittery. She doesn't think her symptoms are improving on the amoxicillin. She measured her blood pressure and found it to be elevated up to the systolic number of 144R at home. She was trying an over-the-counter herbal medication for blood pressure control but has not been affected. She denies taking over-the-counter decongestants such as pseudoephedrine. She denies any chest pain or shortness of breath. No lower extremity swelling or pain. She also reports stress may be contributing. She expresses significant familial stress at this time. Past Medical History  Diagnosis Date  . Diverticulitis   . Seasonal allergies    Past Surgical History  Procedure Laterality Date  . Appendectomy     Family History  Problem Relation Age of Onset  . Cancer Other    Social History  Substance Use Topics  . Smoking status: Current Every Day Smoker -- 0.50 packs/day for 25 years    Types: Cigarettes  . Smokeless tobacco: None  . Alcohol Use: Yes     Comment: occasionally   OB History    No data available     Review of Systems  10 Systems reviewed and are negative for  acute change except as noted in the HPI.  Allergies  Review of patient's allergies indicates no known allergies.  Home Medications   Prior to Admission medications   Medication Sig Start Date End Date Taking? Authorizing Provider  cetirizine (ZYRTEC) 10 MG tablet Take 10 mg by mouth daily as needed for allergies.    Yes Historical Provider, MD  ibuprofen (ADVIL,MOTRIN) 200 MG tablet Take 400 mg by mouth every 6 (six) hours as needed for headache or moderate pain.   Yes Historical Provider, MD  naproxen sodium (ANAPROX) 220 MG tablet Take 440 mg by mouth 2 (two) times daily with a meal.    Yes Historical Provider, MD  OVER THE COUNTER MEDICATION Take 0.5-1 tablets by mouth 2 (two) times daily. OTC herbal blood pressure med   Yes Historical Provider, MD  acetaminophen (TYLENOL) 500 MG tablet Take 2 tablets (1,000 mg total) by mouth every 6 (six) hours as needed. 08/23/15   Charlesetta Shanks, MD  amoxicillin (AMOXIL) 500 MG capsule Take 1 capsule (500 mg total) by mouth 3 (three) times daily. 08/03/15   Nicole Pisciotta, PA-C  clindamycin (CLEOCIN) 150 MG capsule Take 1 capsule (150 mg total) by mouth every 6 (six) hours. 07/21/14   Noland Fordyce, PA-C  HYDROcodone-acetaminophen (NORCO/VICODIN) 5-325 MG tablet Take 1-2 tablets by mouth every 6 hours as needed for pain and/or cough. 08/03/15   Nicole Pisciotta, PA-C  ibuprofen (ADVIL,MOTRIN) 800 MG tablet Take 1 tablet (800 mg total) by mouth 3 (three) times daily.  06/03/15   Charlann Lange, PA-C  lisinopril (PRINIVIL,ZESTRIL) 5 MG tablet Take 1 tablet (5 mg total) by mouth daily. 08/23/15   Charlesetta Shanks, MD  ondansetron (ZOFRAN) 4 MG tablet Take 1 tablet (4 mg total) by mouth every 6 (six) hours. Patient taking differently: Take 4 mg by mouth every 6 (six) hours as needed for nausea or vomiting.  07/22/14   Blanchie Dessert, MD  traMADol (ULTRAM) 50 MG tablet Take 1 tablet (50 mg total) by mouth every 6 (six) hours as needed. Patient taking differently:  Take 50 mg by mouth every 6 (six) hours as needed for moderate pain.  07/21/14   Noland Fordyce, PA-C   BP 163/95 mmHg  Pulse 93  Temp(Src) 97.7 F (36.5 C) (Oral)  Resp 18  SpO2 95% Physical Exam  Constitutional: She is oriented to person, place, and time. She appears well-developed and well-nourished.  HENT:  Head: Normocephalic and atraumatic.  Bilateral TMs normal. No facial swelling. Oral cavity pink and moist. Posterior oropharynx widely patent. She is slightly tearful and has nasal congestion.  Eyes: EOM are normal. Pupils are equal, round, and reactive to light.  Neck: Neck supple.  Cardiovascular: Normal rate, regular rhythm, normal heart sounds and intact distal pulses.   Pulmonary/Chest: Effort normal and breath sounds normal.  Abdominal: Soft. Bowel sounds are normal. She exhibits no distension. There is no tenderness.  Musculoskeletal: Normal range of motion. She exhibits no edema.  Neurological: She is alert and oriented to person, place, and time. She has normal strength. No cranial nerve deficit. She exhibits normal muscle tone. Coordination normal. GCS eye subscore is 4. GCS verbal subscore is 5. GCS motor subscore is 6.  Skin: Skin is warm, dry and intact.  Psychiatric:  Patient is alert and pleasant. She does become slightly tearful though when mentioning immediate family stressors.    ED Course  Procedures (including critical care time) Labs Review Labs Reviewed  CBC WITH DIFFERENTIAL/PLATELET - Abnormal; Notable for the following:    MCV 100.7 (*)    All other components within normal limits  COMPREHENSIVE METABOLIC PANEL  URINALYSIS, ROUTINE W REFLEX MICROSCOPIC (NOT AT [facility])  URINE RAPID DRUG SCREEN, HOSP PERFORMED    Imaging Review Dg Chest 2 View  08/23/2015  CLINICAL DATA:  Hypertension.  Tobacco use.  Headache. EXAM: CHEST  2 VIEW COMPARISON:  March 12, 2012 FINDINGS: Lungs are clear. Heart size and pulmonary vascularity are normal. No adenopathy.  There is mild anterior wedging of the T11 vertebral body. There is a small focus of calcification in the left carotid artery. IMPRESSION: No edema or consolidation. Anterior wedging of the T11 vertebral body, age uncertain but not present on prior study from 2013. Small focus of calcification in left carotid artery. Electronically Signed   By: Lowella Grip III M.D.   On: 08/23/2015 15:09   Ct Head Wo Contrast  08/23/2015  CLINICAL DATA:  Elevated blood pressure. Dizziness, ringing in ears, headache. EXAM: CT HEAD WITHOUT CONTRAST TECHNIQUE: Contiguous axial images were obtained from the base of the skull through the vertex without intravenous contrast. COMPARISON:  None. FINDINGS: No acute intracranial abnormality. Specifically, no hemorrhage, hydrocephalus, mass lesion, acute infarction, or significant intracranial injury. No acute calvarial abnormality. Visualized paranasal sinuses and mastoids clear. Orbital soft tissues unremarkable. IMPRESSION: Normal study. Electronically Signed   By: Rolm Baptise M.D.   On: 08/23/2015 15:05   I have personally reviewed and evaluated these images and lab results as part of my  medical decision-making.   EKG Interpretation None      MDM   Final diagnoses:  Acute URI  Essential hypertension  Anxiety   At this time I suspect patient has had chronic hypertension. Last year home measurements were systolics in the 458K. Patient does not have signs of endorgan damage. She has had recent URI. CT does not show sinus fluid or significant inflammation. At this time we will not continue any antibiotics. Her ENT exam does not suggest acute bacterial infection currently. She will be initiated on lisinopril for blood pressure and counseled on avoiding NSAIDs and OTC stimulants. She is also advised to quit smoking. Resources are provided for outpatient follow-up. She is given signs and symptoms were to return.    Charlesetta Shanks, MD 08/23/15 1538

## 2015-08-23 NOTE — ED Notes (Signed)
Pt very upset and crying stating that her son is on heroin and plans to leave for [place] [employer] tomorrow.

## 2015-08-23 NOTE — ED Notes (Signed)
Patient transported to CT and returned to room without distress noted.

## 2015-08-23 NOTE — ED Notes (Signed)
Awake. Verbally responsive. A/O x4. Resp even and unlabored. No audible adventitious breath sounds noted. ABC's intact.  

## 2015-08-23 NOTE — Discharge Instructions (Signed)
Hypertension °Hypertension, commonly called high blood pressure, is when the force of blood pumping through your arteries is too strong. Your arteries are the blood vessels that carry blood from your heart throughout your body. A blood pressure reading consists of a higher number over a lower number, such as 110/72. The higher number (systolic) is the pressure inside your arteries when your heart pumps. The lower number (diastolic) is the pressure inside your arteries when your heart relaxes. Ideally you want your blood pressure below 120/80. °Hypertension forces your heart to work harder to pump blood. Your arteries may become narrow or stiff. Having untreated or uncontrolled hypertension can cause heart attack, stroke, kidney disease, and other problems. °RISK FACTORS °Some risk factors for high blood pressure are controllable. Others are not.  °Risk factors you cannot control include:  °· Race. You may be at higher risk if you are African American. °· Age. Risk increases with age. °· Gender. Men are at higher risk than women before age 45 years. After age 65, women are at higher risk than men. °Risk factors you can control include: °· Not getting enough exercise or physical activity. °· Being overweight. °· Getting too much fat, sugar, calories, or salt in your diet. °· Drinking too much alcohol. °SIGNS AND SYMPTOMS °Hypertension does not usually cause signs or symptoms. Extremely high blood pressure (hypertensive crisis) may cause headache, anxiety, shortness of breath, and nosebleed. °DIAGNOSIS °To check if you have hypertension, your health care provider will measure your blood pressure while you are seated, with your arm held at the level of your heart. It should be measured at least twice using the same arm. Certain conditions can cause a difference in blood pressure between your right and left arms. A blood pressure reading that is higher than normal on one occasion does not mean that you need treatment. If  it is not clear whether you have high blood pressure, you may be asked to return on a different day to have your blood pressure checked again. Or, you may be asked to monitor your blood pressure at home for 1 or more weeks. °TREATMENT °Treating high blood pressure includes making lifestyle changes and possibly taking medicine. Living a healthy lifestyle can help lower high blood pressure. You may need to change some of your habits. °Lifestyle changes may include: °· Following the DASH diet. This diet is high in fruits, vegetables, and whole grains. It is low in salt, red meat, and added sugars. °· Keep your sodium intake below 2,300 mg per day. °· Getting at least 30-45 minutes of aerobic exercise at least 4 times per week. °· Losing weight if necessary. °· Not smoking. °· Limiting alcoholic beverages. °· Learning ways to reduce stress. °Your health care provider may prescribe medicine if lifestyle changes are not enough to get your blood pressure under control, and if one of the following is true: °· You are 18-59 years of age and your systolic blood pressure is above 140. °· You are 60 years of age or older, and your systolic blood pressure is above 150. °· Your diastolic blood pressure is above 90. °· You have diabetes, and your systolic blood pressure is over 140 or your diastolic blood pressure is over 90. °· You have kidney disease and your blood pressure is above 140/90. °· You have heart disease and your blood pressure is above 140/90. °Your personal target blood pressure may vary depending on your medical conditions, your age, and other factors. °HOME CARE INSTRUCTIONS °·   Have your blood pressure rechecked as directed by your health care provider.   Take medicines only as directed by your health care provider. Follow the directions carefully. Blood pressure medicines must be taken as prescribed. The medicine does not work as well when you skip doses. Skipping doses also puts you at risk for  problems.  Do not smoke.   Monitor your blood pressure at home as directed by your health care provider. SEEK MEDICAL CARE IF:   You think you are having a reaction to medicines taken.  You have recurrent headaches or feel dizzy.  You have swelling in your ankles.  You have trouble with your vision. SEEK IMMEDIATE MEDICAL CARE IF:  You develop a severe headache or confusion.  You have unusual weakness, numbness, or feel faint.  You have severe chest or abdominal pain.  You vomit repeatedly.  You have trouble breathing. MAKE SURE YOU:   Understand these instructions.  Will watch your condition.  Will get help right away if you are not doing well or get worse.   This information is not intended to replace advice given to you by your health care provider. Make sure you discuss any questions you have with your health care provider.   Document Released: 07/17/2005 Document Revised: 12/01/2014 Document Reviewed: 05/09/2013 Elsevier Interactive Patient Education 2016 Elsevier Inc. Upper Respiratory Infection, Adult Due to blood pressure, you need to avoid over-the-counter decongestants that are not specifically marked "blood pressure safe". You may take Tylenol for headache. Avoid ibuprofen, naproxen, Aleve products. Most upper respiratory infections (URIs) are a viral infection of the air passages leading to the lungs. A URI affects the nose, throat, and upper air passages. The most common type of URI is nasopharyngitis and is typically referred to as "the common cold." URIs run their course and usually go away on their own. Most of the time, a URI does not require medical attention, but sometimes a bacterial infection in the upper airways can follow a viral infection. This is called a secondary infection. Sinus and middle ear infections are common types of secondary upper respiratory infections. Bacterial pneumonia can also complicate a URI. A URI can worsen asthma and chronic  obstructive pulmonary disease (COPD). Sometimes, these complications can require emergency medical care and may be life threatening.  CAUSES Almost all URIs are caused by viruses. A virus is a type of germ and can spread from one person to another.  RISKS FACTORS You may be at risk for a URI if:   You smoke.   You have chronic heart or lung disease.  You have a weakened defense (immune) system.   You are very young or very old.   You have nasal allergies or asthma.  You work in crowded or poorly ventilated areas.  You work in health care facilities or schools. SIGNS AND SYMPTOMS  Symptoms typically develop 2-3 days after you come in contact with a cold virus. Most viral URIs last 7-10 days. However, viral URIs from the influenza virus (flu virus) can last 14-18 days and are typically more severe. Symptoms may include:   Runny or stuffy (congested) nose.   Sneezing.   Cough.   Sore throat.   Headache.   Fatigue.   Fever.   Loss of appetite.   Pain in your forehead, behind your eyes, and over your cheekbones (sinus pain).  Muscle aches.  DIAGNOSIS  Your health care provider may diagnose a URI by:  Physical exam.  Tests to check that  your symptoms are not due to another condition such as:  Strep throat.  Sinusitis.  Pneumonia.  Asthma. TREATMENT  A URI goes away on its own with time. It cannot be cured with medicines, but medicines may be prescribed or recommended to relieve symptoms. Medicines may help:  Reduce your fever.  Reduce your cough.  Relieve nasal congestion. HOME CARE INSTRUCTIONS   Take medicines only as directed by your health care provider.   Gargle warm saltwater or take cough drops to comfort your throat as directed by your health care provider.  Use a warm mist humidifier or inhale steam from a shower to increase air moisture. This may make it easier to breathe.  Drink enough fluid to keep your urine clear or pale  yellow.   Eat soups and other clear broths and maintain good nutrition.   Rest as needed.   Return to work when your temperature has returned to normal or as your health care provider advises. You may need to stay home longer to avoid infecting others. You can also use a face mask and careful hand washing to prevent spread of the virus.  Increase the usage of your inhaler if you have asthma.   Do not use any tobacco products, including cigarettes, chewing tobacco, or electronic cigarettes. If you need help quitting, ask your health care provider. PREVENTION  The best way to protect yourself from getting a cold is to practice good hygiene.   Avoid oral or hand contact with people with cold symptoms.   Wash your hands often if contact occurs.  There is no clear evidence that vitamin C, vitamin E, echinacea, or exercise reduces the chance of developing a cold. However, it is always recommended to get plenty of rest, exercise, and practice good nutrition.  SEEK MEDICAL CARE IF:   You are getting worse rather than better.   Your symptoms are not controlled by medicine.   You have chills.  You have worsening shortness of breath.  You have brown or red mucus.  You have yellow or brown nasal discharge.  You have pain in your face, especially when you bend forward.  You have a fever.  You have swollen neck glands.  You have pain while swallowing.  You have white areas in the back of your throat. SEEK IMMEDIATE MEDICAL CARE IF:   You have severe or persistent:  Headache.  Ear pain.  Sinus pain.  Chest pain.  You have chronic lung disease and any of the following:  Wheezing.  Prolonged cough.  Coughing up blood.  A change in your usual mucus.  You have a stiff neck.  You have changes in your:  Vision.  Hearing.  Thinking.  Mood. MAKE SURE YOU:   Understand these instructions.  Will watch your condition.  Will get help right away if you are  not doing well or get worse.   This information is not intended to replace advice given to you by your health care provider. Make sure you discuss any questions you have with your health care provider.   Document Released: 01/10/2001 Document Revised: 12/01/2014 Document Reviewed: 10/22/2013 Elsevier Interactive Patient Education 2016 [employer].  Emergency Department Resource Guide 1) Find a Doctor and Pay Out of Pocket Although you won't have to find out who is covered by your insurance plan, it is a good idea to ask around and get recommendations. You will then need to call the office and see if the doctor you have chosen will  accept you as a new patient and what types of options they offer for patients who are self-pay. Some doctors offer discounts or will set up payment plans for their patients who do not have insurance, but you will need to ask so you aren't surprised when you get to your appointment.  2) Contact Your Local Health Department Not all health departments have doctors that can see patients for sick visits, but many do, so it is worth a call to see if yours does. If you don't know where your local health department is, you can check in your phone book. The CDC also has a tool to help you locate your state's health department, and many state websites also have listings of all of their local health departments.  3) Find a [facility] If your illness is not likely to be very severe or complicated, you may want to try a walk in clinic. These are popping up all over the country in pharmacies, drugstores, and shopping centers. They're usually staffed by nurse practitioners or physician assistants that have been trained to treat common illnesses and complaints. They're usually fairly quick and inexpensive. However, if you have serious medical issues or chronic medical problems, these are probably not your best option.  No Primary Care Doctor: - Call Health Connect at  [phone] -  they can help you locate a primary care doctor that  accepts your insurance, provides certain services, etc. - Physician Referral Service- [phone]  Chronic Pain Problems: Organization         Address  Phone   Notes  [facility]  [phone] Patients need to be referred by their primary care doctor.   Medication Assistance: Organization         Address  Phone   Notes  [facility] Medication [facility] [address] [place], [place] [address] [phone] --Must be a resident of [facility] -- Must have NO insurance coverage whatsoever (no Medicaid/ Medicare, etc.) -- The pt. MUST have a primary care doctor that directs their care regularly and follows them in the community   [facility]  [phone]   [employer]  [phone]    Agencies that provide inexpensive medical care: Organization         Address  Phone   Notes  [place]  [phone]   [facility]    [phone]   [facility] [place], [place] [address] [phone]   [place] [address], [place] [phone]   Planned Parenthood    [phone]   [facility]    [phone]   [place] and [place] [address], [place] Phone:  [phone], Fax:  [phone] Hours of Operation:  9 am - 6 pm, M-F.  Also accepts Medicaid/Medicare and self-pay.  [facility] for [place] [address], [address], [place] Phone: [phone], Fax: [phone]. Hours of Operation:  8:30 am - 5:30 pm, M-F.  Also accepts Medicaid and self-pay.  [facility] [place] [address] [place] Phone: [phone]   [place], [place], [place] [phone], Ext. [phone] Mondays & Thursdays: 7-9 AM.  First 15 patients are seen on a first come, first serve basis.    [place] Providers:  Organization  Address  Phone   Notes  [facility] [address], [place] [phone] Also accepts self-pay patients.  [facility] [address], [place]  [phone]   [place], [address], [place] [phone]   [facility] [address] [place] [phone]   Lucianne Lei [address], [place]   [phone] Only accepts [place] Access [place] patients after they have their name applied to their card.   Self-Pay (no insurance) in [facility]:  Organization         Address  Phone   Notes  Sickle Cell Patients, [facility] Internal Medicine [place] [phone]   [facility] [place] [phone]   [facility] [place]  [place], [place], [place] [phone]   [facility]/Dr. Osei-Bonsu  [address], [place] or [address], [place] [phone] Phone number for both [place] and [place] locations is the same.  [facility] and [facility] [address], [place] [phone]   [facility] [address] [place] or [address] [phone]   [facility] [address], [place] [phone], phone; [phone], fax Sees patients 1st and 3rd Saturday of every month.  Must not qualify for public or private insurance (i.e. Medicaid, Medicare, [place] Health Choice, Veterans' Benefits)  Household income should be no more than 200% of the poverty level The clinic cannot treat you if you are pregnant or think you are pregnant  Sexually transmitted diseases are not treated at the clinic.    Dental Care: Organization         Address  Phone  Notes  [facility] [place] [phone] Accepts children up to age 75 who are enrolled in [place] or [place]; pregnant women with a Medicaid card; and children who have applied for Medicaid or [place] Health Choice, but were declined, whose parents can pay a reduced fee at time of service.  [facility] Department of [facility]  [address], [place] [phone] Accepts children up to age 82 who are enrolled in [place] or [place]; pregnant women with a Medicaid card; and children who have applied for Medicaid or [place] Health Choice, but were declined, whose parents can pay a reduced fee at time of service.  [place] Adult Dental Access PROGRAM  [place] [phone] Patients are seen by appointment only. Walk-ins are not accepted. [place] will see patients 9 years of age and older. Monday - Tuesday (8am-5pm) Most Wednesdays (8:30-5pm) $30 per visit, cash only  [facility] Adult Dental Access PROGRAM  [address], [facility] [phone] Patients are seen by appointment only. Walk-ins are not accepted. [place] will see patients 70 years of age and older. One Wednesday Evening (Monthly: Volunteer Based).  $30 per visit, cash only  [place]  [phone] for adults; Children under age 66, call Graduate Pediatric Dentistry at [phone]. Children aged 10-14, please call [phone] to request a pediatric application.  Dental services are provided in all areas of dental care including fillings,  crowns and bridges, complete and partial dentures, implants, gum treatment, root canals, and extractions. Preventive care is also provided. Treatment is provided to both adults and children. Patients are selected via a lottery and there is often a waiting list.   [facility] [address] [place]  [phone] [email or web address]   Rescue  [facility] [address] [place], [place] [phone], Ext. [phone] Second and Fourth Thursday of each month, opens at 6:30 AM; Clinic ends at 9 AM.  Patients are seen on a first-come first-served basis, and a limited number are seen during each clinic.   [facility]  [address] Hillard Danker [place], [place] [phone]   Eligibility Requirements You must have lived in [place], [place], or [place] counties for at least the last three months.   You cannot be eligible for state or federal sponsored [employer], including [employer], [place], or [employer].   You generally cannot be eligible for healthcare insurance through your employer.    How to apply: Eligibility screenings are held every Tuesday and Wednesday afternoon from 1:00 pm until 4:00 pm. You do not need an appointment for the interview!  [facility] [address], [place], [place]   [place]  [place] Department  [place]  [phone]    Behavioral Health Resources in the Community: Intensive Outpatient Programs Organization         Address  Phone  Notes  [place]. [address], [place], [place] [phone]   [facility] Outpatient [address], [place], [place]   ADS: [facility] [address], [place], [place]   [place] [address]  [place], [place] or [phone]   Substance Abuse Resources Organization         Address  Phone  Notes  Alcohol and Drug Services  [phone]   [place]  [phone]   [facility]   Chinita Pester  [phone]   Residential & Outpatient Substance Abuse Program  [phone]   Psychological Services Organization         Address  Phone  Notes  [facility] [place]   [place]  [phone]   [place] [address] [place] or [phone]    Mobile Crisis Teams Organization         Address  Phone  Notes  [facility], Mobile Crisis Care Unit  [phone]   [facility]  [address] [place], [place]   [place] [address] [place] [phone]    Self-Help/Support Groups Organization         Address  Phone             Notes  [place]. of [place] - variety of support groups  [place] Call for more information  Narcotics Anonymous (NA), [facility] [address], [employer] [place]  2 meetings at this location   Special educational needs teacher         Address  Phone  Notes  [facility]idential Treatment [place],    [place]  [phone]   [facility]  [address] [place] [address], [place], [place]   [place], [place]  [place] [phone] Admissions: 8am-3pm M-F  Incentives Substance [place] [address],    [place], [place] [phone]   [facility] [address] [place], [place], [place]   [facility] [address]  [place], [place]   [facility] Programs - Intensive Outpatient [address], [place], [place]   [facility] ([place].,  [place], [place] [phone] or [phone]   [facility]idential Treatment Services (RTS) [address] [place], [place] Accepts Medicaid  [place] [address],  [place] [phone] Substance Abuse/Addiction Treatment   [facility] Organization         Address  Phone  Notes  [facility]  [phone]   Domenic Schwab, PhD [address] Arlis [place], [place]   [phone] or  [phone]   Goldonna Graham Baxter [place], [place] [phone]   [facility] [address] [place], [place] [phone] Insurance/Medicaid/sponsorship through [facility] Families [address] [place]                                    [place], [place] [phone] [place] [address], [place] [phone]    Dr. Adele Schilder  [phone]   [facility] Dept. 1) [address], [place] 2) [place] 3)  [place] [address], [place] [phone]  [phone]   [place] [phone] or [phone] (After Hours)

## 2015-08-23 NOTE — ED Notes (Signed)
Went into the room to do an EKG the patient request I leave the room long enough for her to complete her private phone call.

## 2015-08-23 NOTE — ED Notes (Signed)
Patient transported to X-ray 

## 2015-08-23 NOTE — ED Notes (Addendum)
Pt reported having elevated BP in past 2 weeks with higher in mornings, dizziness, ringing in ears, headache, denies visual disturbances, near syncopal episodes, taken herbal medication-Levodyn without effectiveness.

## 2015-11-08 ENCOUNTER — Emergency Department (HOSPITAL_COMMUNITY)
Admission: EM | Admit: 2015-11-08 | Discharge: 2015-11-08 | Disposition: A | Discharge status: Home / Self Care | Payer: Self-pay | Attending: Emergency Medicine | Admitting: Emergency Medicine

## 2015-11-08 ENCOUNTER — Emergency Department (HOSPITAL_COMMUNITY)
Admission: EM | Admit: 2015-11-08 | Discharge: 2015-11-08 | Disposition: A | Discharge status: Home / Self Care | Payer: MEDICAID | Attending: Emergency Medicine | Admitting: Emergency Medicine

## 2015-11-08 ENCOUNTER — Encounter (HOSPITAL_COMMUNITY): Payer: Self-pay | Admitting: Emergency Medicine

## 2015-11-08 DIAGNOSIS — K029 Dental caries, unspecified: Secondary | ICD-10-CM | POA: Insufficient documentation

## 2015-11-08 DIAGNOSIS — K0889 Other specified disorders of teeth and supporting structures: Secondary | ICD-10-CM | POA: Insufficient documentation

## 2015-11-08 DIAGNOSIS — I159 Secondary hypertension, unspecified: Secondary | ICD-10-CM

## 2015-11-08 DIAGNOSIS — F17210 Nicotine dependence, cigarettes, uncomplicated: Secondary | ICD-10-CM | POA: Insufficient documentation

## 2015-11-08 DIAGNOSIS — Z792 Long term (current) use of antibiotics: Secondary | ICD-10-CM | POA: Insufficient documentation

## 2015-11-08 DIAGNOSIS — Z79899 Other long term (current) drug therapy: Secondary | ICD-10-CM | POA: Insufficient documentation

## 2015-11-08 DIAGNOSIS — I10 Essential (primary) hypertension: Secondary | ICD-10-CM | POA: Insufficient documentation

## 2015-11-08 DIAGNOSIS — K047 Periapical abscess without sinus: Secondary | ICD-10-CM

## 2015-11-08 DIAGNOSIS — Z791 Long term (current) use of non-steroidal anti-inflammatories (NSAID): Secondary | ICD-10-CM | POA: Insufficient documentation

## 2015-11-08 HISTORY — DX: Essential (primary) hypertension: I10

## 2015-11-08 MED ORDER — TRAMADOL HCL 50 MG PO TABS: 50.0000 mg | ORAL_TABLET | Freq: Four times a day (QID) | ORAL | Status: DC | PRN

## 2015-11-08 MED ORDER — AMOXICILLIN 500 MG PO CAPS
500.0000 mg | ORAL_CAPSULE | Freq: Once | ORAL | Status: AC
  Administered 2015-11-08: 500 mg via ORAL
  Filled 2015-11-08: qty 1

## 2015-11-08 MED ORDER — AMOXICILLIN 500 MG PO CAPS: 500.0000 mg | ORAL_CAPSULE | Freq: Three times a day (TID) | ORAL | Status: DC

## 2015-11-08 NOTE — ED Notes (Signed)
Called Pt in lobby with no response X 1

## 2015-11-08 NOTE — ED Provider Notes (Signed)
CSN: [id number]     Arrival date & time 11/08/15  0440 History   First MD Initiated Contact with Patient 11/08/15 [phone]     Chief Complaint  Patient presents with  . Dental Pain    left lower     (Consider location/radiation/quality/duration/timing/severity/associated sxs/prior Treatment) HPI  Patient with PMH of pain to the LLQ tooth. The pain started this past Saturday and she know has swelling to the area. She has taken Tylenol at home for pain, last took at dose around midnight. She denies having any systemic symptoms of CP, SOB, N/V/D, dysuria, back pain, headaches or neck pain.  Past Medical History  Diagnosis Date  . Diverticulitis   . Seasonal allergies   . Hypertension    Past Surgical History  Procedure Laterality Date  . Appendectomy     Family History  Problem Relation Age of Onset  . Cancer Other    Social History  Substance Use Topics  . Smoking status: Current Every Day Smoker -- 1.00 packs/day for 25 years    Types: Cigarettes  . Smokeless tobacco: None  . Alcohol Use: Yes     Comment: occasionally   OB History    No data available     Review of Systems  Review of Systems All other systems negative except as documented in the HPI. All pertinent positives and negatives as reviewed in the HPI.  Allergies  Review of patient's allergies indicates no known allergies.  Home Medications   Prior to Admission medications   Medication Sig Start Date End Date Taking? Authorizing Provider  acetaminophen (TYLENOL) 500 MG tablet Take 2 tablets (1,000 mg total) by mouth every 6 (six) hours as needed. 08/23/15   Charlesetta Shanks, MD  amoxicillin (AMOXIL) 500 MG capsule Take 1 capsule (500 mg total) by mouth 3 (three) times daily. 08/03/15   Nicole Pisciotta, PA-C  amoxicillin (AMOXIL) 500 MG capsule Take 1 capsule (500 mg total) by mouth 3 (three) times daily. 11/08/15   Mekenzie Modeste Carlota Raspberry, PA-C  cetirizine (ZYRTEC) 10 MG tablet Take 10 mg by mouth daily as needed for  allergies.     Historical Provider, MD  clindamycin (CLEOCIN) 150 MG capsule Take 1 capsule (150 mg total) by mouth every 6 (six) hours. 07/21/14   Noland Fordyce, PA-C  HYDROcodone-acetaminophen (NORCO/VICODIN) 5-325 MG tablet Take 1-2 tablets by mouth every 6 hours as needed for pain and/or cough. 08/03/15   Nicole Pisciotta, PA-C  ibuprofen (ADVIL,MOTRIN) 200 MG tablet Take 400 mg by mouth every 6 (six) hours as needed for headache or moderate pain.    Historical Provider, MD  ibuprofen (ADVIL,MOTRIN) 800 MG tablet Take 1 tablet (800 mg total) by mouth 3 (three) times daily. 06/03/15   Charlann Lange, PA-C  lisinopril (PRINIVIL,ZESTRIL) 5 MG tablet Take 1 tablet (5 mg total) by mouth daily. 08/23/15   Charlesetta Shanks, MD  naproxen sodium (ANAPROX) 220 MG tablet Take 440 mg by mouth 2 (two) times daily with a meal.     Historical Provider, MD  ondansetron (ZOFRAN) 4 MG tablet Take 1 tablet (4 mg total) by mouth every 6 (six) hours. Patient taking differently: Take 4 mg by mouth every 6 (six) hours as needed for nausea or vomiting.  07/22/14   Blanchie Dessert, MD  OVER THE COUNTER MEDICATION Take 0.5-1 tablets by mouth 2 (two) times daily. OTC herbal blood pressure med    Historical Provider, MD  traMADol (ULTRAM) 50 MG tablet Take 1 tablet (50 mg total) by mouth  every 6 (six) hours as needed. Patient taking differently: Take 50 mg by mouth every 6 (six) hours as needed for moderate pain.  07/21/14   Noland Fordyce, PA-C  traMADol (ULTRAM) 50 MG tablet Take 1 tablet (50 mg total) by mouth every 6 (six) hours as needed. 11/08/15   Delita Chiquito Carlota Raspberry, PA-C   BP 191/110 mmHg  Pulse 106  Temp(Src) 97.5 F (36.4 C) (Oral)  Resp 20  Ht '5\' 4"'$  (1.626 m)  Wt 58.968 kg  BMI 22.30 kg/m2  SpO2 99% Physical Exam  Constitutional: She appears well-developed and well-nourished. No distress.  HENT:  Head: Normocephalic and atraumatic.  Mouth/Throat: Uvula is midline, oropharynx is clear and moist and mucous membranes  are normal. No trismus in the jaw. Normal dentition. Dental caries (Pts tooth shows no obvious abscess but moderate to severe tenderness to palpation of marked tooth) present. No dental abscesses or uvula swelling.    Pt has swelling to lower jaw with likely abscessed tooth by I am unable to visualize the abscess on physical exam.  Eyes: Pupils are equal, round, and reactive to light.  Neck: Trachea normal, normal range of motion and full passive range of motion without pain. Neck supple.  Cardiovascular: Normal rate, regular rhythm, normal heart sounds and normal pulses.   Pulmonary/Chest: Effort normal and breath sounds normal. No respiratory distress. Chest wall is not dull to percussion. She exhibits no tenderness, no crepitus, no edema, no deformity and no retraction.  Abdominal: Normal appearance.  Musculoskeletal: Normal range of motion.  Neurological: She is alert. She has normal strength.  Skin: Skin is warm, dry and intact. She is not diaphoretic.  Psychiatric: She has a normal mood and affect. Her speech is normal. Cognition and memory are normal.    ED Course  Procedures (including critical care time) Labs Review Labs Reviewed - No data to display  Imaging Review No results found. I have personally reviewed and evaluated these images and lab results as part of my medical decision-making.   EKG Interpretation None      MDM   Final diagnoses:  Dental abscess    Medications  amoxicillin (AMOXIL) capsule 500 mg (not administered)    55 y.o.Dameshia Coombes's evaluation in the Emergency Department is complete.  We have discussed signs and symptoms that warrant return to the ED, such as changes or worsening in symptoms. No emergent s/sx's present. Patent airway. No trismus.  No neck tenderness or protrusion of tongue or floor of mouth. Patient will be given an rx for Amoxicillin and Ultram. She will be referred to a dentist with instructions for follow-up.   Patient  advised that she needs to be complaint with her at home BP medications.  Vital signs are stable at discharge. Filed Vitals:   11/08/15 0448  BP: 191/110  Pulse: 106  Temp: 97.5 F (36.4 C)  Resp: 20    Patient/guardian has voiced understanding and agreed to follow-up with the PCP or specialist.      Delos Haring, PA-C 11/08/15 1884  Merryl Hacker, MD 11/08/15 [phone]

## 2015-11-08 NOTE — ED Notes (Signed)
Seen this a.m. Here for abscess tooth, given antibiotics and sent home. States she went home, took the antibiotic, went to sleep, and when she woke up the left side of her face had swollen a lot. She states it's made it difficult to swallow. Pt appears stable in triage. Obvious swelling to left side of face

## 2015-11-08 NOTE — ED Notes (Signed)
Called without response from lobby

## 2015-11-08 NOTE — ED Notes (Signed)
No answer when called 

## 2015-11-08 NOTE — Discharge Instructions (Signed)
Dental Abscess A dental abscess is a collection of pus in or around a tooth. CAUSES This condition is caused by a bacterial infection around the root of the tooth that involves the inner part of the tooth (pulp). It may result from:  Severe tooth decay.  Trauma to the tooth that allows bacteria to enter into the pulp, such as a broken or chipped tooth.  Severe gum disease around a tooth. SYMPTOMS Symptoms of this condition include:  Severe pain in and around the infected tooth.  Swelling and redness around the infected tooth, in the mouth, or in the face.  Tenderness.  Pus drainage.  Bad breath.  Bitter taste in the mouth.  Difficulty swallowing.  Difficulty opening the mouth.  Nausea.  Vomiting.  Chills.  Swollen neck glands.  Fever. DIAGNOSIS This condition is diagnosed with examination of the infected tooth. During the exam, your dentist may tap on the infected tooth. Your dentist will also ask about your medical and dental history and may order X-rays. TREATMENT This condition is treated by eliminating the infection. This may be done with:  Antibiotic medicine.  A root canal. This may be performed to save the tooth.  Pulling (extracting) the tooth. This may also involve draining the abscess. This is done if the tooth cannot be saved. HOME CARE INSTRUCTIONS  Take medicines only as directed by your dentist.  If you were prescribed antibiotic medicine, finish all of it even if you start to feel better.  Rinse your mouth (gargle) often with salt water to relieve pain or swelling.  Do not drive or operate heavy machinery while taking pain medicine.  Do not apply heat to the outside of your mouth.  Keep all follow-up visits as directed by your dentist. This is important. SEEK MEDICAL CARE IF:  Your pain is worse and is not helped by medicine. SEEK IMMEDIATE MEDICAL CARE IF:  You have a fever or chills.  Your symptoms suddenly get worse.  You have a  very bad headache.  You have problems breathing or swallowing.  You have trouble opening your mouth.  You have swelling in your neck or around your eye.   This information is not intended to replace advice given to you by your health care provider. Make sure you discuss any questions you have with your health care provider.   Document Released: 07/17/2005 Document Revised: 12/01/2014 Document Reviewed: 07/14/2014 Elsevier Interactive Patient Education 2016 [place] Extraction A dental extraction is the removal (extraction) of a tooth. You may need to have a dental extraction if:   You have tooth decay or gum disease.  You have an infection (abscess).  Room needs to be made for other teeth to grow in or to be aligned properly.  Baby (primary) teeth are preventing adult (permanent) teeth from coming to the surface (erupting).  You have a tooth fracture or fractures that are not repairable.  You are going to be having radiation to your head and neck. The type and length of procedure that you have depends on the reason for the extraction and the placement of the tooth or teeth that are being removed. The procedure may be:  A simple extraction. This is done if the tooth is visible in the mouth and is above the gumline.  A surgical extraction. This is done if the tooth has not come into the mouth or if the tooth is broken off below the gumline. LET [facility] CARE PROVIDER KNOW ABOUT:  Any allergies you have.  All medicines you are taking, including vitamins, herbs, eye drops, creams, and over-the-counter medicines.  Previous problems you or members of your family have had with the use of anesthetics.  Any blood disorders you have.  Previous surgeries you have had.  Any medical conditions you may have. RISKS AND COMPLICATIONS Generally, this is a safe procedure. However, problems may occur, including:  Damage to surrounding teeth, nerves, tissues, or  structures.  The blood clot does not form or stay in place where the tooth was removed. This causes the bones and nerves underneath to be exposed (dry socket). This can delay healing.  Incomplete extraction of roots.  Jawbone injury, pain, or weakness. BEFORE THE PROCEDURE  Ask your health care provider about:  Changing or stopping your regular medicines. This is especially important if you are taking diabetes medicines or blood thinners.  Taking medicines such as aspirin and ibuprofen. These medicines can thin your blood. Do not take these medicines before your procedure if your health care provider instructs you not to.  Take medicines, such as antibiotic medicines, as directed by your health care provider.  Follow instructions from your health care provider about eating or drinking restrictions.  Plan to have someone take you home after the procedure.  If you go home right after the procedure, plan to have someone with you for 24 hours. PROCEDURE  You may be given one or more of the following:  A medicine that helps you relax (sedative).  A medicine that numbs the area (local anesthetic).  A medicine that makes you fall asleep (general anesthetic).  If you are having a simple extraction:  Your dentist will loosen the tooth with an instrument called an elevator.  Another instrument called forceps will be used to grasp the tooth and remove it from the socket.  The open socket will be cleaned.  Gauze will be placed in the socket to reduce bleeding.  If you are having a surgical extraction:  Your dentist will make an incision in the gum.  Some of the bone around the tooth may need to be removed.  The tooth will be removed.  Stitches (sutures) may be required to close the area. The procedure may vary among health care providers and hospitals. AFTER THE PROCEDURE  You may have gauze in your mouth where the tooth was removed. If directed by your health care provider,  apply gentle pressure on the gauze for up to one hour after the procedure. This will help to control bleeding.  A blood clot should begin to form over the open socket. This is normal. Do not touch the area, and do not rinse it.  You may be given medicines to help control pain and help your recovery.   This information is not intended to replace advice given to you by your health care provider. Make sure you discuss any questions you have with your health care provider.   Document Released: 07/17/2005 Document Revised: 12/01/2014 Document Reviewed: 07/13/2014 Elsevier Interactive Patient Education 2016 [place] and Dentist Visits Dental care supports good overall health. Regular dental visits can also help you avoid dental pain, bleeding, infection, and other more serious health problems in the future. It is important to keep the mouth healthy because diseases in the teeth, gums, and other oral tissues can spread to other areas of the body. Some problems, such as diabetes, heart disease, and pre-term labor have been associated with poor oral health.  See your dentist every 6 months. If you experience emergency problems such as a toothache or broken tooth, go to the dentist right away. If you see your dentist regularly, you may catch problems early. It is easier to be treated for problems in the early stages.  WHAT TO EXPECT AT A DENTIST VISIT  Your dentist will look for many common oral health problems and recommend proper treatment. At your regular dental visit, you can expect:  Gentle cleaning of the teeth and gums. This includes scraping and polishing. This helps to remove the sticky substance around the teeth and gums (plaque). Plaque forms in the mouth shortly after eating. Over time, plaque hardens on the teeth as tartar. If tartar is not removed regularly, it can cause problems. Cleaning also helps remove stains.  Periodic X-rays. These pictures of the teeth and supporting  bone will help your dentist assess the health of your teeth.  Periodic fluoride treatments. Fluoride is a natural mineral shown to help strengthen teeth. Fluoride treatmentinvolves applying a fluoride gel or varnish to the teeth. It is most commonly done in children.  Examination of the mouth, tongue, jaws, teeth, and gums to look for any oral health problems, such as:  Cavities (dental caries). This is decay on the tooth caused by plaque, sugar, and acid in the mouth. It is best to catch a cavity when it is small.  Inflammation of the gums caused by plaque buildup (gingivitis).  Problems with the mouth or malformed or misaligned teeth.  Oral cancer or other diseases of the soft tissues or jaws. KEEP YOUR TEETH AND GUMS HEALTHY For healthy teeth and gums, follow these general guidelines as well as your dentist's specific advice:  Have your teeth professionally cleaned at the dentist every 6 months.  Brush twice daily with a fluoride toothpaste.  Floss your teeth daily.  Ask your dentist if you need fluoride supplements, treatments, or fluoride toothpaste.  Eat a healthy diet. Reduce foods and drinks with added sugar.  Avoid smoking. TREATMENT FOR ORAL HEALTH PROBLEMS If you have oral health problems, treatment varies depending on the conditions present in your teeth and gums.  Your caregiver will most likely recommend good oral hygiene at each visit.  For cavities, gingivitis, or other oral health disease, your caregiver will perform a procedure to treat the problem. This is typically done at a separate appointment. Sometimes your caregiver will refer you to another dental specialist for specific tooth problems or for surgery. SEEK IMMEDIATE DENTAL CARE IF:  You have pain, bleeding, or soreness in the gum, tooth, jaw, or mouth area.  A permanent tooth becomes loose or separated from the gum socket.  You experience a blow or injury to the mouth or jaw area.   This  information is not intended to replace advice given to you by your health care provider. Make sure you discuss any questions you have with your health care provider.   Document Released: 03/29/2011 Document Revised: 10/09/2011 Document Reviewed: 03/29/2011 Elsevier Interactive Patient Education [employer].

## 2015-11-08 NOTE — ED Notes (Addendum)
Patient states she began having left lower dental pain on Saturday. Patient tylenol, last at 0000. PA at bedside. Swelling noted to left lower job.

## 2015-11-08 NOTE — ED Notes (Signed)
2nd attempt to call Pt.

## 2015-11-09 ENCOUNTER — Emergency Department (HOSPITAL_COMMUNITY)
Admission: EM | Admit: 2015-11-09 | Discharge: 2015-11-10 | Disposition: A | Discharge status: Home / Self Care | Payer: Self-pay | Attending: Emergency Medicine | Admitting: Emergency Medicine

## 2015-11-09 ENCOUNTER — Encounter (HOSPITAL_COMMUNITY): Payer: Self-pay

## 2015-11-09 DIAGNOSIS — Z792 Long term (current) use of antibiotics: Secondary | ICD-10-CM | POA: Insufficient documentation

## 2015-11-09 DIAGNOSIS — K047 Periapical abscess without sinus: Secondary | ICD-10-CM | POA: Insufficient documentation

## 2015-11-09 DIAGNOSIS — H9209 Otalgia, unspecified ear: Secondary | ICD-10-CM | POA: Insufficient documentation

## 2015-11-09 DIAGNOSIS — I10 Essential (primary) hypertension: Secondary | ICD-10-CM | POA: Insufficient documentation

## 2015-11-09 DIAGNOSIS — F17210 Nicotine dependence, cigarettes, uncomplicated: Secondary | ICD-10-CM | POA: Insufficient documentation

## 2015-11-09 DIAGNOSIS — R6883 Chills (without fever): Secondary | ICD-10-CM | POA: Insufficient documentation

## 2015-11-09 DIAGNOSIS — J029 Acute pharyngitis, unspecified: Secondary | ICD-10-CM | POA: Insufficient documentation

## 2015-11-09 DIAGNOSIS — Z79899 Other long term (current) drug therapy: Secondary | ICD-10-CM | POA: Insufficient documentation

## 2015-11-09 MED ORDER — CLINDAMYCIN PHOSPHATE 900 MG/50ML IV SOLN
900.0000 mg | Freq: Once | INTRAVENOUS | Status: AC
  Administered 2015-11-10: 900 mg via INTRAVENOUS
  Filled 2015-11-09: qty 50

## 2015-11-09 MED ORDER — SODIUM CHLORIDE 0.9 % IV BOLUS (SEPSIS)
500.0000 mL | Freq: Once | INTRAVENOUS | Status: AC
  Administered 2015-11-10: 500 mL via INTRAVENOUS

## 2015-11-09 NOTE — ED Notes (Signed)
Pt was seen here Monday morning for an abcess tooth and received antibiotics but wouldn't take the pain meds because they make her sick, she now is in severe pain and her jaw is swollen and she's nauseated and dizzy.

## 2015-11-10 ENCOUNTER — Encounter (HOSPITAL_COMMUNITY): Payer: Self-pay

## 2015-11-10 ENCOUNTER — Emergency Department (HOSPITAL_COMMUNITY): Payer: Self-pay

## 2015-11-10 MED ORDER — ONDANSETRON HCL 4 MG PO TABS: 4.0000 mg | ORAL_TABLET | Freq: Four times a day (QID) | ORAL | Status: DC

## 2015-11-10 MED ORDER — MORPHINE SULFATE (PF) 4 MG/ML IV SOLN
4.0000 mg | Freq: Once | INTRAVENOUS | Status: AC
  Administered 2015-11-10: 4 mg via INTRAVENOUS
  Filled 2015-11-10: qty 1

## 2015-11-10 MED ORDER — ONDANSETRON HCL 4 MG/2ML IJ SOLN
4.0000 mg | Freq: Once | INTRAMUSCULAR | Status: AC
  Administered 2015-11-10: 4 mg via INTRAVENOUS
  Filled 2015-11-10: qty 2

## 2015-11-10 MED ORDER — IOPAMIDOL (ISOVUE-300) INJECTION 61%
75.0000 mL | Freq: Once | INTRAVENOUS | Status: AC | PRN
  Administered 2015-11-10: 75 mL via INTRAVENOUS

## 2015-11-10 MED ORDER — CLINDAMYCIN HCL 150 MG PO CAPS: 300.0000 mg | ORAL_CAPSULE | Freq: Three times a day (TID) | ORAL | Status: DC

## 2015-11-10 NOTE — Discharge Instructions (Signed)
Dental Abscess A dental abscess is a collection of pus in or around a tooth. CAUSES This condition is caused by a bacterial infection around the root of the tooth that involves the inner part of the tooth (pulp). It may result from:  Severe tooth decay.  Trauma to the tooth that allows bacteria to enter into the pulp, such as a broken or chipped tooth.  Severe gum disease around a tooth. SYMPTOMS Symptoms of this condition include:  Severe pain in and around the infected tooth.  Swelling and redness around the infected tooth, in the mouth, or in the face.  Tenderness.  Pus drainage.  Bad breath.  Bitter taste in the mouth.  Difficulty swallowing.  Difficulty opening the mouth.  Nausea.  Vomiting.  Chills.  Swollen neck glands.  Fever. DIAGNOSIS This condition is diagnosed with examination of the infected tooth. During the exam, your dentist may tap on the infected tooth. Your dentist will also ask about your medical and dental history and may order X-rays. TREATMENT This condition is treated by eliminating the infection. This may be done with:  Antibiotic medicine.  A root canal. This may be performed to save the tooth.  Pulling (extracting) the tooth. This may also involve draining the abscess. This is done if the tooth cannot be saved. HOME CARE INSTRUCTIONS  Take medicines only as directed by your dentist.  If you were prescribed antibiotic medicine, finish all of it even if you start to feel better.  Rinse your mouth (gargle) often with salt water to relieve pain or swelling.  Do not drive or operate heavy machinery while taking pain medicine.  Do not apply heat to the outside of your mouth.  Keep all follow-up visits as directed by your dentist. This is important. SEEK MEDICAL CARE IF:  Your pain is worse and is not helped by medicine. SEEK IMMEDIATE MEDICAL CARE IF:  You have a fever or chills.  Your symptoms suddenly get worse.  You have a  very bad headache.  You have problems breathing or swallowing.  You have trouble opening your mouth.  You have swelling in your neck or around your eye.   This information is not intended to replace advice given to you by your health care provider. Make sure you discuss any questions you have with your health care provider.   Document Released: 07/17/2005 Document Revised: 12/01/2014 Document Reviewed: 07/14/2014 Elsevier Interactive Patient Education 2016 [place] and Dentist Visits Dental care supports good overall health. Regular dental visits can also help you avoid dental pain, bleeding, infection, and other more serious health problems in the future. It is important to keep the mouth healthy because diseases in the teeth, gums, and other oral tissues can spread to other areas of the body. Some problems, such as diabetes, heart disease, and pre-term labor have been associated with poor oral health.  See your dentist every 6 months. If you experience emergency problems such as a toothache or broken tooth, go to the dentist right away. If you see your dentist regularly, you may catch problems early. It is easier to be treated for problems in the early stages.  WHAT TO EXPECT AT A DENTIST VISIT  Your dentist will look for many common oral health problems and recommend proper treatment. At your regular dental visit, you can expect:  Gentle cleaning of the teeth and gums. This includes scraping and polishing. This helps to remove the sticky substance around the teeth and gums (  plaque). Plaque forms in the mouth shortly after eating. Over time, plaque hardens on the teeth as tartar. If tartar is not removed regularly, it can cause problems. Cleaning also helps remove stains.  Periodic X-rays. These pictures of the teeth and supporting bone will help your dentist assess the health of your teeth.  Periodic fluoride treatments. Fluoride is a natural mineral shown to help  strengthen teeth. Fluoride treatmentinvolves applying a fluoride gel or varnish to the teeth. It is most commonly done in children.  Examination of the mouth, tongue, jaws, teeth, and gums to look for any oral health problems, such as:  Cavities (dental caries). This is decay on the tooth caused by plaque, sugar, and acid in the mouth. It is best to catch a cavity when it is small.  Inflammation of the gums caused by plaque buildup (gingivitis).  Problems with the mouth or malformed or misaligned teeth.  Oral cancer or other diseases of the soft tissues or jaws. KEEP YOUR TEETH AND GUMS HEALTHY For healthy teeth and gums, follow these general guidelines as well as your dentist's specific advice:  Have your teeth professionally cleaned at the dentist every 6 months.  Brush twice daily with a fluoride toothpaste.  Floss your teeth daily.  Ask your dentist if you need fluoride supplements, treatments, or fluoride toothpaste.  Eat a healthy diet. Reduce foods and drinks with added sugar.  Avoid smoking. TREATMENT FOR ORAL HEALTH PROBLEMS If you have oral health problems, treatment varies depending on the conditions present in your teeth and gums.  Your caregiver will most likely recommend good oral hygiene at each visit.  For cavities, gingivitis, or other oral health disease, your caregiver will perform a procedure to treat the problem. This is typically done at a separate appointment. Sometimes your caregiver will refer you to another dental specialist for specific tooth problems or for surgery. SEEK IMMEDIATE DENTAL CARE IF:  You have pain, bleeding, or soreness in the gum, tooth, jaw, or mouth area.  A permanent tooth becomes loose or separated from the gum socket.  You experience a blow or injury to the mouth or jaw area.   This information is not intended to replace advice given to you by your health care provider. Make sure you discuss any questions you have with your  health care provider.   Document Released: 03/29/2011 Document Revised: 10/09/2011 Document Reviewed: 03/29/2011 Elsevier Interactive Patient Education [employer].

## 2015-11-10 NOTE — ED Provider Notes (Signed)
CSN: [id number]     Arrival date & time 11/09/15  2140 History   First MD Initiated Contact with Patient 11/09/15 2324     Chief Complaint  Patient presents with  . Dental Pain     (Consider location/radiation/quality/duration/timing/severity/associated sxs/prior Treatment) HPI Comments: Patient seen yesterday and diagnosed with dental abscess returns today with onset of facial swelling and increased pain. No fever but she reports chills. No nausea or vomiting. She states she has a sense of throat fullness and like it affects her breathing. She continues to manage her own secretions and is able to eat and drink. She reports taking the antibiotics she was prescribed (Amoxil) but can't take the pain medication secondary to nausea it causes.  Patient is a 55 y.o. female presenting with tooth pain. The history is provided by the patient and the spouse. No language interpreter was used.  Dental Pain Location:  Lower Severity:  Moderate Associated symptoms: facial swelling and neck pain (See HPI.)   Associated symptoms: no fever     Past Medical History  Diagnosis Date  . Diverticulitis   . Seasonal allergies   . Hypertension    Past Surgical History  Procedure Laterality Date  . Appendectomy     Family History  Problem Relation Age of Onset  . Cancer Other    Social History  Substance Use Topics  . Smoking status: Current Every Day Smoker -- 1.00 packs/day for 25 years    Types: Cigarettes  . Smokeless tobacco: None  . Alcohol Use: Yes     Comment: occasionally   OB History    No data available     Review of Systems  Constitutional: Positive for chills. Negative for fever.  HENT: Positive for dental problem, ear pain, facial swelling, sore throat and trouble swallowing.   Respiratory: Negative.  Negative for shortness of breath and stridor.   Cardiovascular: Negative.   Gastrointestinal: Positive for nausea (only nausea with taking pain medication). Negative for vomiting  and abdominal pain.  Musculoskeletal: Positive for neck pain (See HPI.).  Skin: Negative.   Neurological: Negative.       Allergies  Review of patient's allergies indicates no known allergies.  Home Medications   Prior to Admission medications   Medication Sig Start Date End Date Taking? Authorizing Provider  acetaminophen (TYLENOL) 500 MG tablet Take 2 tablets (1,000 mg total) by mouth every 6 (six) hours as needed. Patient taking differently: Take 1,000 mg by mouth every 6 (six) hours as needed for mild pain, moderate pain or headache.  08/23/15  Yes Charlesetta Shanks, MD  amoxicillin (AMOXIL) 500 MG capsule Take 1 capsule (500 mg total) by mouth 3 (three) times daily. 11/08/15  Yes Tiffany Carlota Raspberry, PA-C  ibuprofen (ADVIL,MOTRIN) 200 MG tablet Take 400 mg by mouth every 6 (six) hours as needed for headache or moderate pain.   Yes Historical Provider, MD  lisinopril (PRINIVIL,ZESTRIL) 5 MG tablet Take 1 tablet (5 mg total) by mouth daily. 08/23/15  Yes Charlesetta Shanks, MD  naproxen sodium (ANAPROX) 220 MG tablet Take 440 mg by mouth every 12 (twelve) hours as needed (pain).    Yes Historical Provider, MD  OVER THE COUNTER MEDICATION Take 0.5-1 tablets by mouth 2 (two) times daily. OTC herbal blood pressure med   Yes Historical Provider, MD  amoxicillin (AMOXIL) 500 MG capsule Take 1 capsule (500 mg total) by mouth 3 (three) times daily. Patient not taking: Reported on 11/09/2015 08/03/15   Monico Blitz, PA-C  HYDROcodone-acetaminophen (  NORCO/VICODIN) 5-325 MG tablet Take 1-2 tablets by mouth every 6 hours as needed for pain and/or cough. Patient not taking: Reported on 11/09/2015 08/03/15   Elmyra Ricks Pisciotta, PA-C  ibuprofen (ADVIL,MOTRIN) 800 MG tablet Take 1 tablet (800 mg total) by mouth 3 (three) times daily. Patient not taking: Reported on 11/09/2015 06/03/15   Charlann Lange, PA-C  traMADol (ULTRAM) 50 MG tablet Take 1 tablet (50 mg total) by mouth every 6 (six) hours as needed. Patient not  taking: Reported on 11/09/2015 11/08/15   Delos Haring, PA-C   BP 180/121 mmHg  Pulse 98  Temp(Src) 98.1 F (36.7 C) (Oral)  Resp 2  SpO2 98% Physical Exam  Constitutional: She appears well-developed and well-nourished.  HENT:  Head: Normocephalic.  Left facial swelling that extends from mid-mandible to submental and lateral neck regions. No pointing or visualized dental abscess surrounding markedly eroded #19.   Neck: Normal range of motion. Neck supple. No tracheal deviation present.  Cardiovascular: Normal rate.   Pulmonary/Chest: Effort normal.  Musculoskeletal: Normal range of motion.  Neurological: She is alert.  Skin: Skin is warm and dry. No rash noted.  Psychiatric: She has a normal mood and affect.    ED Course  Procedures (including critical care time) Labs Review Labs Reviewed - No data to display  Imaging Review No results found. I have personally reviewed and evaluated these images and lab results as part of my medical decision-making.   EKG Interpretation None     Ct Soft Tissue Neck W Contrast  11/10/2015  CLINICAL DATA:  LEFT jaw pain and swelling radiating to neck. Evaluate abscess. EXAM: CT NECK WITH CONTRAST TECHNIQUE: Multidetector CT imaging of the neck was performed using the standard protocol following the bolus administration of intravenous contrast. CONTRAST:  39m ISOVUE-300 IOPAMIDOL (ISOVUE-300) INJECTION 61% COMPARISON:  None. FINDINGS: Pharynx and larynx: Normal. Salivary glands: Normal. Thyroid: Normal. Lymph nodes: Subcentimeter scattered lymph nodes are likely reactive without pathologic lymphadenopathy by CT size criteria. Vascular: Mild calcific atherosclerosis of the aortic arch and internal carotid artery origins. Limited intracranial: Normal. Visualized orbits: Normal. Mastoids and visualized paranasal sinuses: Well aerated. Skeleton/soft tissues: Fractured tooth 12 with periapical lucency. Additional periapical lucency/abscess within maxilla  and mandible teeth. Large dental carie of teeth approximate 18 and 19. 11 x 2 mm (AP by transverse) rim enhancing fluid collection in the buccal mucosa of the LEFT mandible body. Overlying soft tissue swelling and subcutaneous fat stranding. Grade 1 C4-5 anterolisthesis on degenerative basis. Moderate C5-6 and C6-7 disc height loss with uncovertebral hypertrophy compatible with degenerative disc with resultant is moderate to severe bilateral C4-5, LEFT C5-6 neural foraminal narrowing. Upper chest: Lung apices are clear. No supra mediastinal lymphadenopathy. IMPRESSION: LEFT lower facial cellulitis with superimposed 11 x 2 mm abscess within the buccal soft tissues LEFT mandible body. Findings are likely odontogenic considering poor dentition with multiple dental caries and periapical lucency/abscess. Fractured tooth 12. Electronically Signed   By: CElon AlasM.D.   On: 11/10/2015 02:20    MDM   Final diagnoses:  None    1. Dental abscess  The patient presents one day after starting treatment with Amoxil for dental abscess, with complaint of significantly worsening facial swelling and pain. No fever. CT soft tissue was performed and periapical dental abscess confirmed without deep neck abscess. She can be discharged home on Clindamycin, stop Amoxil, with dental follow up as planned.    SCharlann Lange PA-C 11/10/15 0[place] MD 11/11/15 [phone]

## 2016-08-12 ENCOUNTER — Encounter (HOSPITAL_COMMUNITY): Payer: Self-pay | Admitting: Emergency Medicine

## 2016-08-12 ENCOUNTER — Emergency Department (HOSPITAL_COMMUNITY)
Admission: EM | Admit: 2016-08-12 | Discharge: 2016-08-12 | Disposition: A | Discharge status: Home / Self Care | Payer: Self-pay | Attending: Emergency Medicine | Admitting: Emergency Medicine

## 2016-08-12 DIAGNOSIS — F17210 Nicotine dependence, cigarettes, uncomplicated: Secondary | ICD-10-CM | POA: Insufficient documentation

## 2016-08-12 DIAGNOSIS — I10 Essential (primary) hypertension: Secondary | ICD-10-CM | POA: Insufficient documentation

## 2016-08-12 DIAGNOSIS — K029 Dental caries, unspecified: Secondary | ICD-10-CM | POA: Insufficient documentation

## 2016-08-12 DIAGNOSIS — Z79899 Other long term (current) drug therapy: Secondary | ICD-10-CM | POA: Insufficient documentation

## 2016-08-12 DIAGNOSIS — K047 Periapical abscess without sinus: Secondary | ICD-10-CM | POA: Insufficient documentation

## 2016-08-12 DIAGNOSIS — J069 Acute upper respiratory infection, unspecified: Secondary | ICD-10-CM | POA: Insufficient documentation

## 2016-08-12 DIAGNOSIS — L03211 Cellulitis of face: Secondary | ICD-10-CM | POA: Insufficient documentation

## 2016-08-12 LAB — RAPID STREP SCREEN (MED CTR MEBANE ONLY): Streptococcus, Group A Screen (Direct): NEGATIVE

## 2016-08-12 MED ORDER — CLINDAMYCIN HCL 300 MG PO CAPS
300.0000 mg | ORAL_CAPSULE | Freq: Once | ORAL | Status: AC
  Administered 2016-08-12: 300 mg via ORAL
  Filled 2016-08-12: qty 1

## 2016-08-12 MED ORDER — HYDROCHLOROTHIAZIDE 12.5 MG PO TABS: 12.5000 mg | ORAL_TABLET | Freq: Every day | ORAL | 0 refills | Status: DC

## 2016-08-12 MED ORDER — CLINDAMYCIN HCL 300 MG PO CAPS: 300.0000 mg | ORAL_CAPSULE | Freq: Four times a day (QID) | ORAL | 0 refills | Status: DC

## 2016-08-12 NOTE — ED Triage Notes (Signed)
Pt states on Thursday she started having a sore throat  Pt states since then the left side of her face has become swollen and painful with pain up into her left ear  Pt unsure if it is from a tooth or sore throat

## 2016-08-12 NOTE — ED Provider Notes (Signed)
[facility] DEPT Provider Note   CSN: [id number] Arrival date & time: 08/12/16  0557     History   Chief Complaint Chief Complaint  Patient presents with  . Dental Pain    HPI Valerie Russell is a 56 y.o. female.  HPI  55 year old female presents for left facial swelling. Patient states this started 2 days ago. 3 days ago she developed a "cold". She has been having cough, rhinorrhea, sore throat. Then 2 days ago started developing the swelling in her face. She's concerned might be coming from a broken tooth on her left mandible. The tooth itself does not hurt. There have been no fevers. No vomiting or trouble swallowing. Has had multiple dental infections in the past. She has resources to help follow-up with a dentist but no one was open yesterday. Pain radiates to her ear. Mild headache. Taking aleve with good relief of pain.  Past Medical History:  Diagnosis Date  . Diverticulitis   . Hypertension   . Seasonal allergies     There are no active problems to display for this patient.   Past Surgical History:  Procedure Laterality Date  . APPENDECTOMY      OB History    No data available       Home Medications    Prior to Admission medications   Medication Sig Start Date End Date Taking? Authorizing Provider  cetirizine (ZYRTEC) 10 MG tablet Take 10 mg by mouth daily.   Yes Historical Provider, MD  naproxen sodium (ANAPROX) 220 MG tablet Take 440 mg by mouth every 12 (twelve) hours as needed (pain).    Yes Historical Provider, MD  acetaminophen (TYLENOL) 500 MG tablet Take 2 tablets (1,000 mg total) by mouth every 6 (six) hours as needed. Patient not taking: Reported on 08/12/2016 08/23/15   Charlesetta Shanks, MD  clindamycin (CLEOCIN) 300 MG capsule Take 1 capsule (300 mg total) by mouth 4 (four) times daily. X 7 days 08/12/16   Sherwood Gambler, MD  hydrochlorothiazide (HYDRODIURIL) 12.5 MG tablet Take 1 tablet (12.5 mg total) by mouth daily. 08/12/16   Sherwood Gambler, MD    ibuprofen (ADVIL,MOTRIN) 800 MG tablet Take 1 tablet (800 mg total) by mouth 3 (three) times daily. Patient not taking: Reported on 08/12/2016 06/03/15   Charlann Lange, PA-C    Family History Family History  Problem Relation Age of Onset  . Cancer Other     Social History Social History  Substance Use Topics  . Smoking status: Current Every Day Smoker    Packs/day: 1.00    Years: 25.00    Types: Cigarettes  . Smokeless tobacco: Never Used  . Alcohol use Yes     Comment: occasionally     Allergies   Patient has no known allergies.   Review of Systems Review of Systems  Constitutional: Negative for fever.  HENT: Positive for dental problem, ear pain, facial swelling, rhinorrhea and sore throat.   Respiratory: Positive for cough.   Gastrointestinal: Negative for diarrhea and vomiting.  All other systems reviewed and are negative.    Physical Exam Updated Vital Signs BP (!) 165/112 (BP Location: Left Arm)   Pulse (!) 123   Temp 97.6 F (36.4 C) (Oral)   Resp 20   SpO2 95%   Physical Exam  Constitutional: She is oriented to person, place, and time. She appears well-developed and well-nourished. No distress.  Resting comfortably  HENT:  Head: Normocephalic and atraumatic.    Right Ear: Tympanic membrane and external  ear normal.  Left Ear: Tympanic membrane and external ear normal.  Nose: Nose normal.  Mouth/Throat: Mucous membranes are normal. No trismus in the jaw. Dental caries present. No oropharyngeal exudate, posterior oropharyngeal edema, posterior oropharyngeal erythema or tonsillar abscesses.    Eyes: Right eye exhibits no discharge. Left eye exhibits no discharge.  Cardiovascular: Regular rhythm and normal heart sounds.  Tachycardia present.   HR 108 on my exam  Pulmonary/Chest: Effort normal and breath sounds normal.  Abdominal: Soft. There is no tenderness.  Neurological: She is alert and oriented to person, place, and time.  Skin: Skin is warm  and dry. She is not diaphoretic.  Nursing note and vitals reviewed.    ED Treatments / Results  Labs (all labs ordered are listed, but only abnormal results are displayed) Labs Reviewed  RAPID STREP SCREEN (NOT AT [facility])  CULTURE, GROUP A STREP [facility])    EKG  EKG Interpretation None       Radiology No results found.  Procedures Procedures (including critical care time)  Medications Ordered in ED Medications  clindamycin (CLEOCIN) capsule 300 mg (not administered)     Initial Impression / Assessment and Plan / ED Course  I have reviewed the triage vital signs and the nursing notes.  Pertinent labs & imaging results that were available during my care of the patient were reviewed by me and considered in my medical decision making (see chart for details).  Clinical Course     Patient appears to have early cellulitis/swelling of face. No airway symptoms. No signs concerning for Ludwig angina. Well appearing. Also appears to have a URI with clear lungs. Tachycardic on arrival to 123, on my exam now low 100s. Discussed increasing fluid intake. She does not appear significantly dehydrated. I don't think imaging is needed at this time. Continue NSAIDs, start antibiotics. Discussed her HTN, she is off meds because she doesn't like how they make her feel. Will try switching and recommend f/u with health department. Given return precautions.  Final Clinical Impressions(s) / ED Diagnoses   Final diagnoses:  Dental caries  Dental infection  Facial cellulitis  Upper respiratory tract infection, unspecified type  Essential hypertension    New Prescriptions New Prescriptions   CLINDAMYCIN (CLEOCIN) 300 MG CAPSULE    Take 1 capsule (300 mg total) by mouth 4 (four) times daily. X 7 days   HYDROCHLOROTHIAZIDE (HYDRODIURIL) 12.5 MG TABLET    Take 1 tablet (12.5 mg total) by mouth daily.     Sherwood Gambler, MD 08/12/16 [phone]

## 2016-08-14 LAB — CULTURE, GROUP A STREP (THRC)

## 2016-08-28 IMAGING — CR DG CHEST 2V
2 series · 2 of 2 positions shown · non-contrast
Comparison: March 12, 2012

CLINICAL DATA: Hypertension.  Tobacco use.  Headache.

EXAM:
CHEST  2 VIEW

[w chest pa]
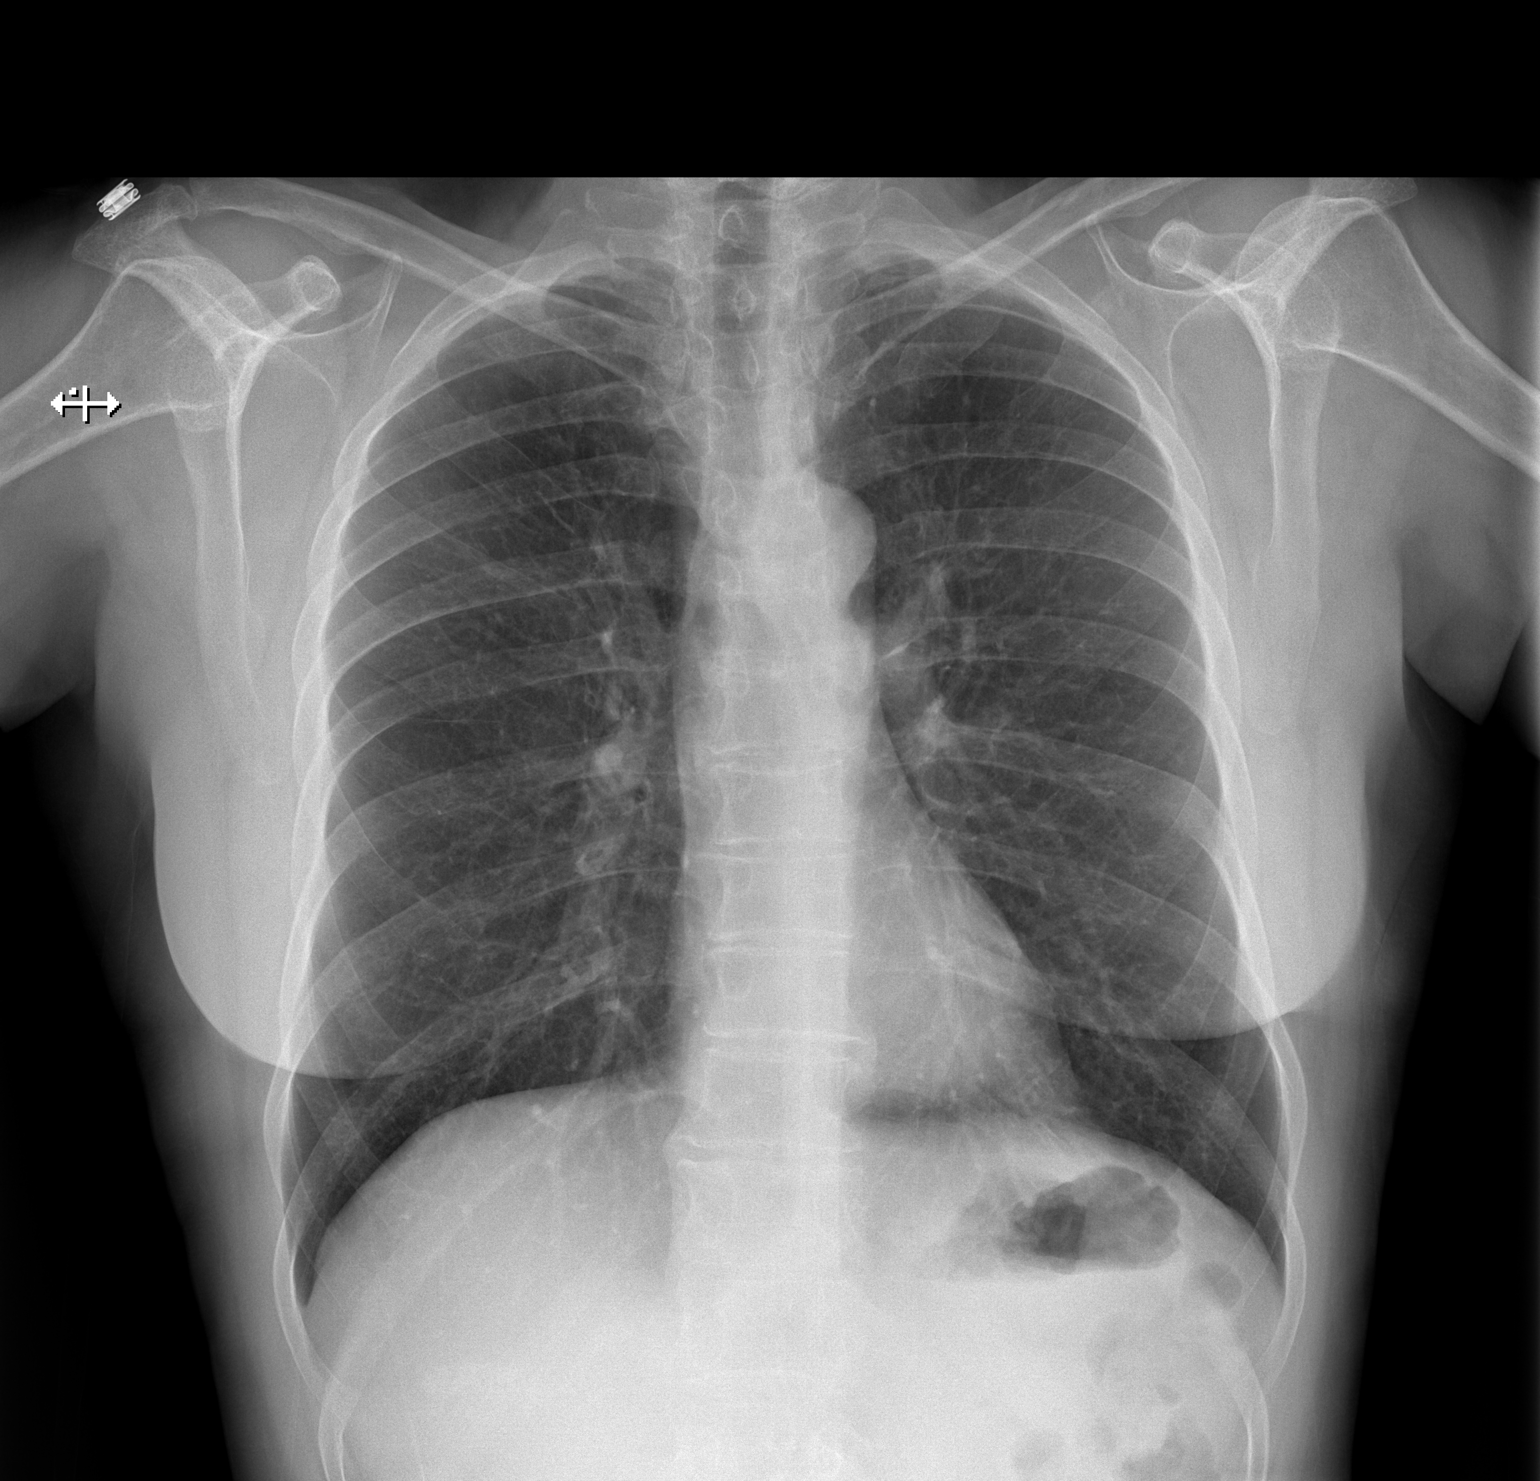

[w chest lat]
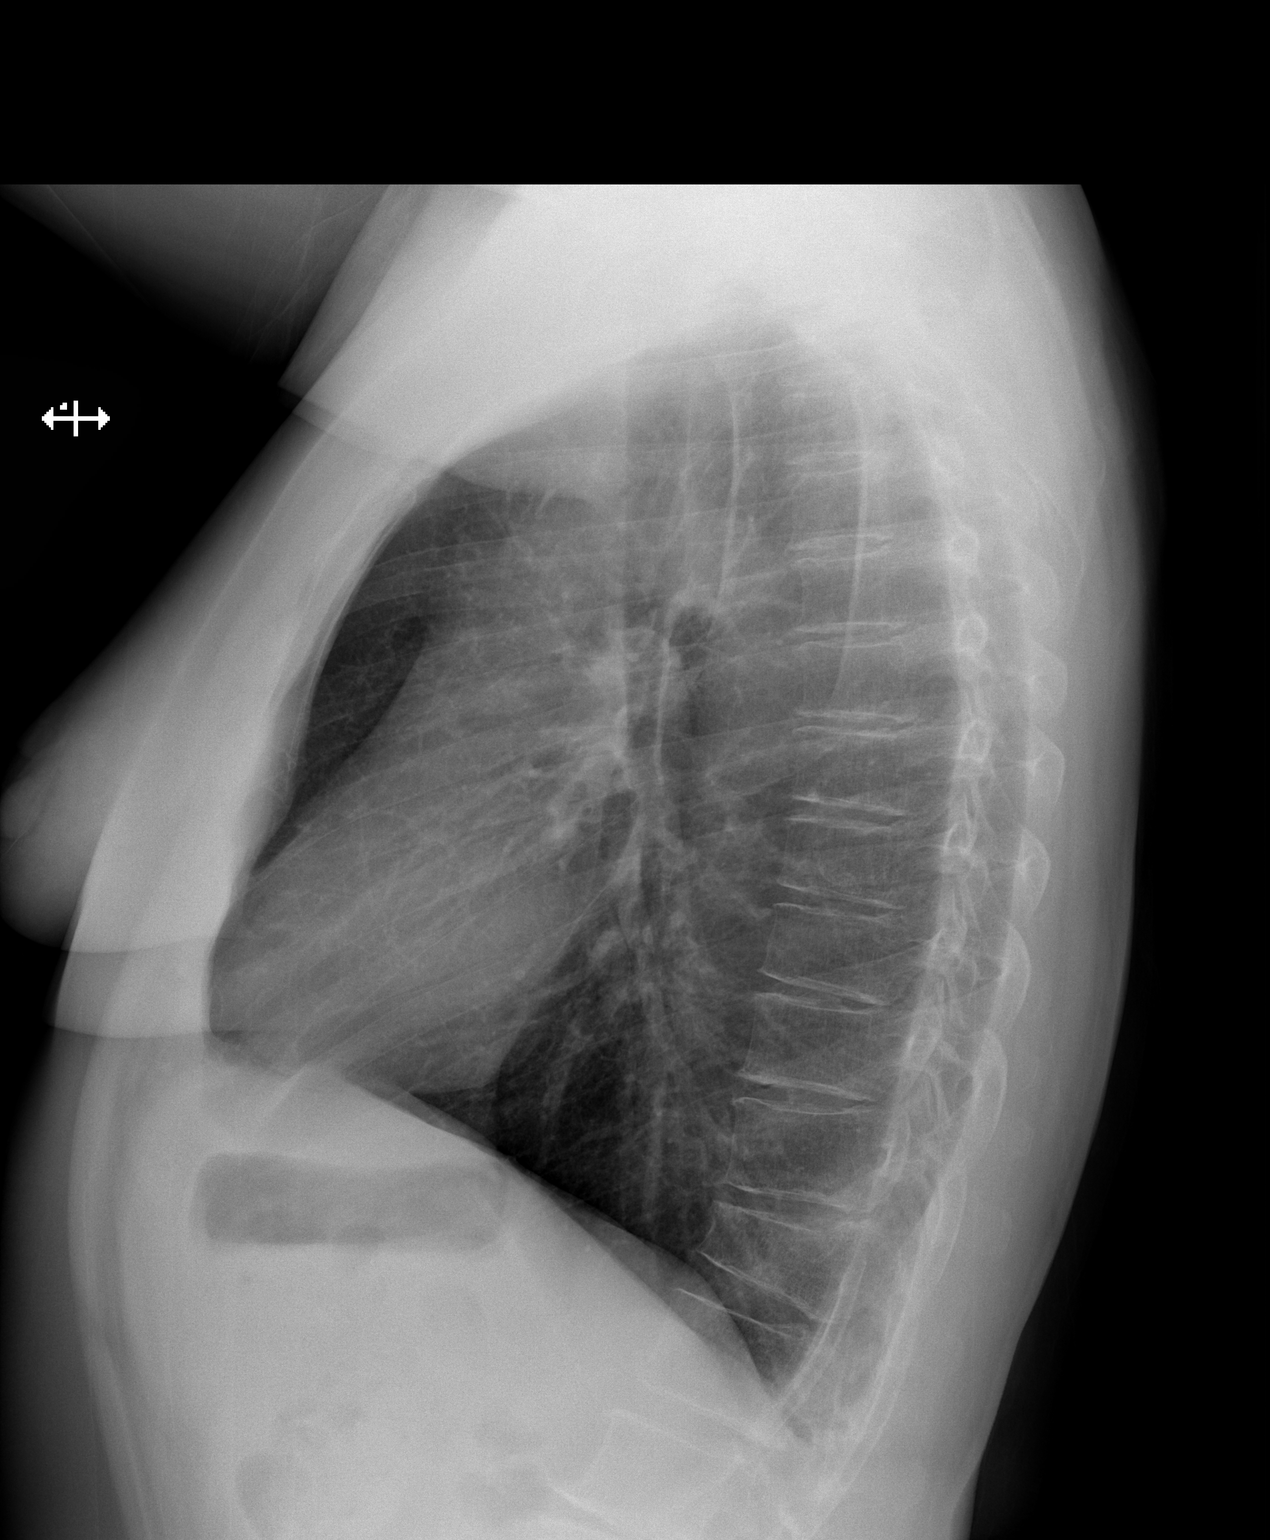

[2 of 2 positions shown; findings below may reference images not displayed]

FINDINGS: Lungs are clear. Heart size and pulmonary vascularity are normal. No
adenopathy. There is mild anterior wedging of the T11 vertebral
body. There is a small focus of calcification in the left carotid
artery.
IMPRESSION: No edema or consolidation. Anterior wedging of the T11 vertebral
body, age uncertain but not present on prior study from 8282. Small
focus of calcification in left carotid artery.

## 2017-09-20 ENCOUNTER — Emergency Department (HOSPITAL_COMMUNITY): Payer: Self-pay

## 2017-09-20 ENCOUNTER — Encounter (HOSPITAL_COMMUNITY): Payer: Self-pay | Admitting: Emergency Medicine

## 2017-09-20 ENCOUNTER — Other Ambulatory Visit: Payer: Self-pay

## 2017-09-20 ENCOUNTER — Emergency Department (HOSPITAL_COMMUNITY)
Admission: EM | Admit: 2017-09-20 | Discharge: 2017-09-20 | Disposition: A | Discharge status: Home / Self Care | Payer: Self-pay | Attending: Emergency Medicine | Admitting: Emergency Medicine

## 2017-09-20 DIAGNOSIS — I10 Essential (primary) hypertension: Secondary | ICD-10-CM | POA: Insufficient documentation

## 2017-09-20 DIAGNOSIS — F17210 Nicotine dependence, cigarettes, uncomplicated: Secondary | ICD-10-CM | POA: Insufficient documentation

## 2017-09-20 DIAGNOSIS — R0981 Nasal congestion: Secondary | ICD-10-CM | POA: Insufficient documentation

## 2017-09-20 DIAGNOSIS — J40 Bronchitis, not specified as acute or chronic: Secondary | ICD-10-CM | POA: Insufficient documentation

## 2017-09-20 DIAGNOSIS — J029 Acute pharyngitis, unspecified: Secondary | ICD-10-CM | POA: Insufficient documentation

## 2017-09-20 DIAGNOSIS — R0600 Dyspnea, unspecified: Secondary | ICD-10-CM | POA: Insufficient documentation

## 2017-09-20 MED ORDER — IPRATROPIUM-ALBUTEROL 0.5-2.5 (3) MG/3ML IN SOLN
3.0000 mL | Freq: Once | RESPIRATORY_TRACT | Status: AC
  Administered 2017-09-20: 3 mL via RESPIRATORY_TRACT
  Filled 2017-09-20: qty 3

## 2017-09-20 MED ORDER — PSEUDOEPHEDRINE HCL ER 120 MG PO TB12: 120.0000 mg | ORAL_TABLET | Freq: Two times a day (BID) | ORAL | 0 refills | Status: DC

## 2017-09-20 MED ORDER — PREDNISONE 20 MG PO TABS
60.0000 mg | ORAL_TABLET | Freq: Once | ORAL | Status: AC
  Administered 2017-09-20: 60 mg via ORAL
  Filled 2017-09-20: qty 3

## 2017-09-20 MED ORDER — PREDNISONE 20 MG PO TABS: 40.0000 mg | ORAL_TABLET | Freq: Every day | ORAL | 0 refills | Status: DC

## 2017-09-20 MED ORDER — ALBUTEROL SULFATE (2.5 MG/3ML) 0.083% IN NEBU
5.0000 mg | INHALATION_SOLUTION | Freq: Once | RESPIRATORY_TRACT | Status: AC
  Administered 2017-09-20: 5 mg via RESPIRATORY_TRACT
  Filled 2017-09-20: qty 6

## 2017-09-20 NOTE — ED Provider Notes (Signed)
[facility] DEPT Provider Note   CSN: [id number] Arrival date & time: 09/20/17  0946     History   Chief Complaint Chief Complaint  Patient presents with  . Cough    HPI Valerie Russell is a 57 y.o. female.  HPI  Patient presents with concern of dyspnea, cough, sinus congestion. Onset was about 3 weeks ago, while the patient was in [facility]. She notes that while there she had persistent symptoms, but upon arriving home her symptoms have become worse, with increased cough, dyspnea, in spite of using home bronchodilators. She also notes that she continues to smoke cigarettes.  Smoking cessation provided, particularly in light of this patient's evaluation in the ED.   No fever, though she has had chills, she also notes that this is not uncommon for her during minor illnesses.   Past Medical History:  Diagnosis Date  . Diverticulitis   . Hypertension   . Seasonal allergies     There are no active problems to display for this patient.   Past Surgical History:  Procedure Laterality Date  . APPENDECTOMY      OB History    No data available       Home Medications    Prior to Admission medications   Medication Sig Start Date End Date Taking? Authorizing Provider  acetaminophen (TYLENOL) 500 MG tablet Take 2 tablets (1,000 mg total) by mouth every 6 (six) hours as needed. 08/23/15  Yes Charlesetta Shanks, MD  cetirizine (ZYRTEC) 10 MG tablet Take 10 mg by mouth daily.   Yes [provider]  clindamycin (CLEOCIN) 300 MG capsule Take 1 capsule (300 mg total) by mouth 4 (four) times daily. X 7 days Patient not taking: Reported on 09/20/2017 08/12/16   Sherwood Gambler, MD  hydrochlorothiazide (HYDRODIURIL) 12.5 MG tablet Take 1 tablet (12.5 mg total) by mouth daily. Patient not taking: Reported on 09/20/2017 08/12/16   Sherwood Gambler, MD  ibuprofen (ADVIL,MOTRIN) 800 MG tablet Take 1 tablet (800 mg total) by mouth 3 (three) times  daily. Patient not taking: Reported on 08/12/2016 06/03/15   Charlann Lange, PA-C    Family History Family History  Problem Relation Age of Onset  . Cancer Other     Social History Social History   Tobacco Use  . Smoking status: Current Every Day Smoker    Packs/day: 1.00    Years: 25.00    Pack years: 25.00    Types: Cigarettes  . Smokeless tobacco: Never Used  Substance Use Topics  . Alcohol use: Yes    Comment: occasionally  . Drug use: No     Allergies   Patient has no known allergies.   Review of Systems Review of Systems  Constitutional:       Per HPI, otherwise negative  HENT:       Per HPI, otherwise negative  Respiratory:       Per HPI, otherwise negative  Cardiovascular:       Per HPI, otherwise negative  Gastrointestinal: Negative for vomiting.  Endocrine:       Negative aside from HPI  Genitourinary:       Neg aside from HPI   Musculoskeletal:       Per HPI, otherwise negative  Skin: Negative.   Neurological: Negative for syncope.     Physical Exam Updated Vital Signs BP (!) 154/89   Pulse 80   Temp 98.5 F (36.9 C) (Oral)   Resp 18   SpO2 100%  Physical Exam  Constitutional: She is oriented to person, place, and time. She appears well-developed and well-nourished. No distress.  HENT:  Head: Normocephalic and atraumatic.  Eyes: Conjunctivae and EOM are normal.  Cardiovascular: Normal rate and regular rhythm.  Pulmonary/Chest: No stridor. No respiratory distress. She has wheezes.  Abdominal: She exhibits no distension.  Musculoskeletal: She exhibits no edema.  Neurological: She is alert and oriented to person, place, and time. No cranial nerve deficit.  Skin: Skin is warm and dry.  Psychiatric: She has a normal mood and affect.  Nursing note and vitals reviewed.    ED Treatments / Results  Radiology Dg Chest 2 View  Result Date: 09/20/2017 CLINICAL DATA:  Cough, ear popping, sore throat EXAM: CHEST  2 VIEW COMPARISON:   08/23/2015 FINDINGS: The heart size and mediastinal contours are within normal limits. Both lungs are clear. The visualized skeletal structures are unremarkable. IMPRESSION: No active cardiopulmonary disease. Electronically Signed   By: Kathreen Devoid   On: 09/20/2017 10:30    Procedures Procedures (including critical care time)  Medications Ordered in ED Medications  albuterol (PROVENTIL) (2.5 MG/3ML) 0.083% nebulizer solution 5 mg (5 mg Nebulization Given 09/20/17 1315)  predniSONE (DELTASONE) tablet 60 mg (60 mg Oral Given 09/20/17 1315)  ipratropium-albuterol (DUONEB) 0.5-2.5 (3) MG/3ML nebulizer solution 3 mL (3 mLs Nebulization Given 09/20/17 1419)     Initial Impression / Assessment and Plan / ED Course  I have reviewed the triage vital signs and the nursing notes.  Pertinent labs & imaging results that were available during my care of the patient were reviewed by me and considered in my medical decision making (see chart for details).     3:19 PM Patient improved after 2 breathing treatments. With no evidence for pneumonia, no chest pain, low suspicion for ACS, no other evidence for other acute new pathology, patient will be discharged with ongoing scheduled nebulizer sessions, steroids, and Sudafed for sinus congestion.   Final Clinical Impressions(s) / ED Diagnoses  Acute bronchitis Sinus congestion   Carmin Muskrat, MD 09/20/17 1521

## 2017-09-20 NOTE — Discharge Instructions (Signed)
As discussed, today's evaluation has been generally reassuring. Please use your nebulizer every 4 hours for the next 2 days. Subsequently may use it as needed. In addition, please obtain the prescribed steroids, Sudafed and use as directed.  Return here for concerning changes in your condition.

## 2017-09-20 NOTE — ED Triage Notes (Addendum)
Pt complaint of cough, ear popping, sore throat ongoing for 3 weeks. Requesting evaluation related to associated fatigue.

## 2018-09-22 ENCOUNTER — Encounter (HOSPITAL_COMMUNITY): Payer: Self-pay | Admitting: Emergency Medicine

## 2018-09-22 ENCOUNTER — Emergency Department (HOSPITAL_COMMUNITY)
Admission: EM | Admit: 2018-09-22 | Discharge: 2018-09-22 | Disposition: A | Discharge status: Home / Self Care | Payer: Self-pay | Attending: Emergency Medicine | Admitting: Emergency Medicine

## 2018-09-22 ENCOUNTER — Other Ambulatory Visit: Payer: Self-pay

## 2018-09-22 DIAGNOSIS — Z5321 Procedure and treatment not carried out due to patient leaving prior to being seen by health care provider: Secondary | ICD-10-CM | POA: Insufficient documentation

## 2018-09-22 DIAGNOSIS — R05 Cough: Secondary | ICD-10-CM | POA: Insufficient documentation

## 2018-09-22 NOTE — ED Triage Notes (Signed)
Pt c/o cough, congestion, sore throat, and chils for week.

## 2019-10-31 ENCOUNTER — Ambulatory Visit: Payer: Self-pay | Admitting: Family Medicine

## 2019-11-06 ENCOUNTER — Ambulatory Visit: Payer: Self-pay | Admitting: Family Medicine

## 2019-12-03 ENCOUNTER — Other Ambulatory Visit: Payer: Self-pay

## 2019-12-04 ENCOUNTER — Telehealth: Payer: Self-pay | Admitting: General Practice

## 2019-12-04 ENCOUNTER — Ambulatory Visit: Payer: Self-pay | Admitting: Family Medicine

## 2019-12-04 NOTE — Telephone Encounter (Signed)
Patient called and stated that she overslept for her new patient appointment. Is this to be billed as a no show or cancellation. Pls advise.

## 2019-12-22 ENCOUNTER — Other Ambulatory Visit: Payer: Self-pay

## 2019-12-23 ENCOUNTER — Ambulatory Visit (INDEPENDENT_AMBULATORY_CARE_PROVIDER_SITE_OTHER): Payer: 59 | Admitting: Family Medicine

## 2019-12-23 ENCOUNTER — Encounter: Payer: Self-pay | Admitting: Family Medicine

## 2019-12-23 VITALS — BP 160/82 | HR 103 | Temp 97.4°F | Ht 63.0 in | Wt 142.0 lb

## 2019-12-23 DIAGNOSIS — J4540 Moderate persistent asthma, uncomplicated: Secondary | ICD-10-CM | POA: Diagnosis not present

## 2019-12-23 DIAGNOSIS — J45909 Unspecified asthma, uncomplicated: Secondary | ICD-10-CM | POA: Insufficient documentation

## 2019-12-23 DIAGNOSIS — Z0000 Encounter for general adult medical examination without abnormal findings: Secondary | ICD-10-CM

## 2019-12-23 DIAGNOSIS — F341 Dysthymic disorder: Secondary | ICD-10-CM

## 2019-12-23 DIAGNOSIS — Z720 Tobacco use: Secondary | ICD-10-CM

## 2019-12-23 DIAGNOSIS — I10 Essential (primary) hypertension: Secondary | ICD-10-CM

## 2019-12-23 LAB — URINALYSIS, ROUTINE W REFLEX MICROSCOPIC
Bilirubin Urine: NEGATIVE
Hgb urine dipstick: NEGATIVE
Ketones, ur: NEGATIVE
Leukocytes,Ua: NEGATIVE
Nitrite: NEGATIVE
RBC / HPF: NONE SEEN (ref 0–?)
Specific Gravity, Urine: 1.025 (ref 1.000–1.030)
Total Protein, Urine: NEGATIVE
Urine Glucose: NEGATIVE
Urobilinogen, UA: 0.2 (ref 0.0–1.0)
pH: 6 (ref 5.0–8.0)

## 2019-12-23 LAB — LIPID PANEL
Cholesterol: 222 mg/dL — ABNORMAL HIGH (ref 0–200)
HDL: 58.4 mg/dL (ref >39.00)
LDL Cholesterol: 142 mg/dL — ABNORMAL HIGH (ref 0–99)
NonHDL: 163.16
Total CHOL/HDL Ratio: 4
Triglycerides: 104 mg/dL (ref 0.0–149.0)
VLDL: 20.8 mg/dL (ref 0.0–40.0)

## 2019-12-23 LAB — COMPREHENSIVE METABOLIC PANEL
ALT: 17 U/L (ref 0–35)
AST: 19 U/L (ref 0–37)
Albumin: 4.4 g/dL (ref 3.5–5.2)
Alkaline Phosphatase: 121 U/L — ABNORMAL HIGH (ref 39–117)
BUN: 12 mg/dL (ref 6–23)
CO2: 26 mEq/L (ref 19–32)
Calcium: 9.5 mg/dL (ref 8.4–10.5)
Chloride: 107 mEq/L (ref 96–112)
Creatinine, Ser: 0.85 mg/dL (ref 0.40–1.20)
GFR: 68.33 mL/min (ref >60.00)
Glucose, Bld: 107 mg/dL — ABNORMAL HIGH (ref 70–99)
Potassium: 4.7 mEq/L (ref 3.5–5.1)
Sodium: 140 mEq/L (ref 135–145)
Total Bilirubin: 0.3 mg/dL (ref 0.2–1.2)
Total Protein: 7.4 g/dL (ref 6.0–8.3)

## 2019-12-23 LAB — CBC
HCT: 31.8 % — ABNORMAL LOW (ref 36.0–46.0)
Hemoglobin: 9.9 g/dL — ABNORMAL LOW (ref 12.0–15.0)
MCHC: 31.2 g/dL (ref 30.0–36.0)
MCV: 76.7 fl — ABNORMAL LOW (ref 78.0–100.0)
Platelets: 377 10*3/uL (ref 150.0–400.0)
RBC: 4.15 Mil/uL (ref 3.87–5.11)
RDW: 20.5 % — ABNORMAL HIGH (ref 11.5–15.5)
WBC: 7.2 10*3/uL (ref 4.0–10.5)

## 2019-12-23 LAB — LDL CHOLESTEROL, DIRECT: Direct LDL: 144 mg/dL

## 2019-12-23 MED ORDER — LISINOPRIL 20 MG PO TABS: 20.0000 mg | ORAL_TABLET | Freq: Every day | ORAL | 0 refills | Status: DC

## 2019-12-23 NOTE — Patient Instructions (Addendum)
Coping with Quitting Smoking  Quitting smoking is a physical and mental challenge. You will face cravings, withdrawal symptoms, and temptation. Before quitting, work with your health care provider to make a plan that can help you cope. Preparation can help you quit and keep you from giving in. How can I cope with cravings? Cravings usually last for 5-10 minutes. If you get through it, the craving will pass. Consider taking the following actions to help you cope with cravings:  Keep your mouth busy: ? Chew sugar-free gum. ? Suck on hard candies or a straw. ? Brush your teeth.  Keep your hands and body busy: ? Immediately change to a different activity when you feel a craving. ? Squeeze or play with a ball. ? Do an activity or a hobby, like making bead jewelry, practicing needlepoint, or working with wood. ? Mix up your normal routine. ? Take a short exercise break. Go for a quick walk or run up and down stairs. ? Spend time in public places where smoking is not allowed.  Focus on doing something kind or helpful for someone else.  Call a friend or family member to talk during a craving.  Join a support group.  Call a quit line, such as 1-800-QUIT-NOW.  Talk with your health care provider about medicines that might help you cope with cravings and make quitting easier for you. How can I deal with withdrawal symptoms? Your body may experience negative effects as it tries to get used to not having nicotine in the system. These effects are called withdrawal symptoms. They may include:  Feeling hungrier than normal.  Trouble concentrating.  Irritability.  Trouble sleeping.  Feeling depressed.  Restlessness and agitation.  Craving a cigarette. To manage withdrawal symptoms:  Avoid places, people, and activities that trigger your cravings.  Remember why you want to quit.  Get plenty of sleep.  Avoid coffee and other caffeinated drinks. These may worsen some of your  symptoms. How can I handle social situations? Social situations can be difficult when you are quitting smoking, especially in the first few weeks. To manage this, you can:  Avoid parties, bars, and other social situations where people might be smoking.  Avoid alcohol.  Leave right away if you have the urge to smoke.  Explain to your family and friends that you are quitting smoking. Ask for understanding and support.  Plan activities with friends or family where smoking is not an option. What are some ways I can cope with stress? Wanting to smoke may cause stress, and stress can make you want to smoke. Find ways to manage your stress. Relaxation techniques can help. For example:  Breathe slowly and deeply, in through your nose and out through your mouth.  Listen to soothing, relaxing music.  Talk with a family member or friend about your stress.  Light a candle.  Soak in a bath or take a shower.  Think about a peaceful place. What are some ways I can prevent weight gain? Be aware that many people gain weight after they quit smoking. However, not everyone does. To keep from gaining weight, have a plan in place before you quit and stick to the plan after you quit. Your plan should include:  Having healthy snacks. When you have a craving, it may help to: ? Eat plain popcorn, crunchy carrots, celery, or other cut vegetables. ? Chew sugar-free gum.  Changing how you eat: ? Eat small portion sizes at meals. ? Eat 4-6 small meals  throughout the day instead of 1-2 large meals a day. ? Be mindful when you eat. Do not watch television or do other things that might distract you as you eat.  Exercising regularly: ? Make time to exercise each day. If you do not have time for a long workout, do short bouts of exercise for 5-10 minutes several times a day. ? Do some form of strengthening exercise, like weight lifting, and some form of aerobic exercise, like running or swimming.  Drinking  plenty of water or other low-calorie or no-calorie drinks. Drink 6-8 glasses of water daily, or as much as instructed by your health care provider. Summary  Quitting smoking is a physical and mental challenge. You will face cravings, withdrawal symptoms, and temptation to smoke again. Preparation can help you as you go through these challenges.  You can cope with cravings by keeping your mouth busy (such as by chewing gum), keeping your body and hands busy, and making calls to family, friends, or a helpline for people who want to quit smoking.  You can cope with withdrawal symptoms by avoiding places where people smoke, avoiding drinks with caffeine, and getting plenty of rest.  Ask your health care provider about the different ways to prevent weight gain, avoid stress, and handle social situations. This information is not intended to replace advice given to you by your health care provider. Make sure you discuss any questions you have with your health care provider. Document Revised: 06/29/2017 Document Reviewed: 07/14/2016 Elsevier Patient Education  2020 [place] Maintenance for Postmenopausal Women Menopause is a normal process in which your ability to get pregnant comes to an end. This process happens slowly over many months or years, usually between the ages of 21 and 64. Menopause is complete when you have missed your menstrual periods for 12 months. It is important to talk with your health care provider about some of the most common conditions that affect women after menopause (postmenopausal women). These include heart disease, cancer, and bone loss (osteoporosis). Adopting a healthy lifestyle and getting preventive care can help to promote your health and wellness. The actions you take can also lower your chances of developing some of these common conditions. What should I know about menopause? During menopause, you may get a number of symptoms, such as:  Hot flashes.  These can be moderate or severe.  Night sweats.  Decrease in sex drive.  Mood swings.  Headaches.  Tiredness.  Irritability.  Memory problems.  Insomnia. Choosing to treat or not to treat these symptoms is a decision that you make with your health care provider. Do I need hormone replacement therapy?  Hormone replacement therapy is effective in treating symptoms that are caused by menopause, such as hot flashes and night sweats.  Hormone replacement carries certain risks, especially as you become older. If you are thinking about using estrogen or estrogen with progestin, discuss the benefits and risks with your health care provider. What is my risk for heart disease and stroke? The risk of heart disease, heart attack, and stroke increases as you age. One of the causes may be a change in the body's hormones during menopause. This can affect how your body uses dietary fats, triglycerides, and cholesterol. Heart attack and stroke are medical emergencies. There are many things that you can do to help prevent heart disease and stroke. Watch your blood pressure  High blood pressure causes heart disease and increases the risk of stroke. This is more likely  to develop in people who have high blood pressure readings, are of African descent, or are overweight.  Have your blood pressure checked: ? Every 3-5 years if you are 3-51 years of age. ? Every year if you are 81 years old or older. Eat a healthy diet   Eat a diet that includes plenty of vegetables, fruits, low-fat dairy products, and lean protein.  Do not eat a lot of foods that are high in solid fats, added sugars, or sodium. Get regular exercise Get regular exercise. This is one of the most important things you can do for your health. Most adults should:  Try to exercise for at least 150 minutes each week. The exercise should increase your heart rate and make you sweat (moderate-intensity exercise).  Try to do strengthening  exercises at least twice each week. Do these in addition to the moderate-intensity exercise.  Spend less time sitting. Even light physical activity can be beneficial. Other tips  Work with your health care provider to achieve or maintain a healthy weight.  Do not use any products that contain nicotine or tobacco, such as cigarettes, e-cigarettes, and chewing tobacco. If you need help quitting, ask your health care provider.  Know your numbers. Ask your health care provider to check your cholesterol and your blood sugar (glucose). Continue to have your blood tested as directed by your health care provider. Do I need screening for cancer? Depending on your health history and family history, you may need to have cancer screening at different stages of your life. This may include screening for:  Breast cancer.  Cervical cancer.  Lung cancer.  Colorectal cancer. What is my risk for osteoporosis? After menopause, you may be at increased risk for osteoporosis. Osteoporosis is a condition in which bone destruction happens more quickly than new bone creation. To help prevent osteoporosis or the bone fractures that can happen because of osteoporosis, you may take the following actions:  If you are 49-75 years old, get at least 1,000 mg of calcium and at least 600 mg of vitamin D per day.  If you are older than age 4 but younger than age 27, get at least 1,200 mg of calcium and at least 600 mg of vitamin D per day.  If you are older than age 43, get at least 1,200 mg of calcium and at least 800 mg of vitamin D per day. Smoking and drinking excessive alcohol increase the risk of osteoporosis. Eat foods that are rich in calcium and vitamin D, and do weight-bearing exercises several times each week as directed by your health care provider. How does menopause affect my mental health? Depression may occur at any age, but it is more common as you become older. Common symptoms of depression  include:  Low or sad mood.  Changes in sleep patterns.  Changes in appetite or eating patterns.  Feeling an overall lack of motivation or enjoyment of activities that you previously enjoyed.  Frequent crying spells. Talk with your health care provider if you think that you are experiencing depression. General instructions See your health care provider for regular wellness exams and vaccines. This may include:  Scheduling regular health, dental, and eye exams.  Getting and maintaining your vaccines. These include: ? Influenza vaccine. Get this vaccine each year before the flu season begins. ? Pneumonia vaccine. ? Shingles vaccine. ? Tetanus, diphtheria, and pertussis (Tdap) booster vaccine. Your health care provider may also recommend other immunizations. Tell your health care provider if you  have ever been abused or do not feel safe at home. Summary  Menopause is a normal process in which your ability to get pregnant comes to an end.  This condition causes hot flashes, night sweats, decreased interest in sex, mood swings, headaches, or lack of sleep.  Treatment for this condition may include hormone replacement therapy.  Take actions to keep yourself healthy, including exercising regularly, eating a healthy diet, watching your weight, and checking your blood pressure and blood sugar levels.  Get screened for cancer and depression. Make sure that you are up to date with all your vaccines. This information is not intended to replace advice given to you by your health care provider. Make sure you discuss any questions you have with your health care provider. Document Revised: 07/10/2018 Document Reviewed: 07/10/2018 Elsevier Patient Education  2020 [employer]  Preventive Care 75-7 Years Old, Female Preventive care refers to visits with your health care provider and lifestyle choices that can promote health and wellness. This includes:  A yearly physical exam. This may also  be called an annual well check.  Regular dental visits and eye exams.  Immunizations.  Screening for certain conditions.  Healthy lifestyle choices, such as eating a healthy diet, getting regular exercise, not using drugs or products that contain nicotine and tobacco, and limiting alcohol use. What can I expect for my preventive care visit? Physical exam Your health care provider will check your:  Height and weight. This may be used to calculate body mass index (BMI), which tells if you are at a healthy weight.  Heart rate and blood pressure.  Skin for abnormal spots. Counseling Your health care provider may ask you questions about your:  Alcohol, tobacco, and drug use.  Emotional well-being.  Home and relationship well-being.  Sexual activity.  Eating habits.  Work and work Statistician.  Method of birth control.  Menstrual cycle.  Pregnancy history. What immunizations do I need?  Influenza (flu) vaccine  This is recommended every year. Tetanus, diphtheria, and pertussis (Tdap) vaccine  You may need a Td booster every 10 years. Varicella (chickenpox) vaccine  You may need this if you have not been vaccinated. Zoster (shingles) vaccine  You may need this after age 56. Measles, mumps, and rubella (MMR) vaccine  You may need at least one dose of MMR if you were born in 1957 or later. You may also need a second dose. Pneumococcal conjugate (PCV13) vaccine  You may need this if you have certain conditions and were not previously vaccinated. Pneumococcal polysaccharide (PPSV23) vaccine  You may need one or two doses if you smoke cigarettes or if you have certain conditions. Meningococcal conjugate (MenACWY) vaccine  You may need this if you have certain conditions. Hepatitis A vaccine  You may need this if you have certain conditions or if you travel or work in places where you may be exposed to hepatitis A. Hepatitis B vaccine  You may need this if you  have certain conditions or if you travel or work in places where you may be exposed to hepatitis B. Haemophilus influenzae type b (Hib) vaccine  You may need this if you have certain conditions. Human papillomavirus (HPV) vaccine  If recommended by your health care provider, you may need three doses over 6 months. You may receive vaccines as individual doses or as more than one vaccine together in one shot (combination vaccines). Talk with your health care provider about the risks and benefits of combination vaccines. What tests  do I need? Blood tests  Lipid and cholesterol levels. These may be checked every 5 years, or more frequently if you are over 20 years old.  Hepatitis C test.  Hepatitis B test. Screening  Lung cancer screening. You may have this screening every year starting at age 33 if you have a 30-pack-year history of smoking and currently smoke or have quit within the past 15 years.  Colorectal cancer screening. All adults should have this screening starting at age 35 and continuing until age 54. Your health care provider may recommend screening at age 62 if you are at increased risk. You will have tests every 1-10 years, depending on your results and the type of screening test.  Diabetes screening. This is done by checking your blood sugar (glucose) after you have not eaten for a while (fasting). You may have this done every 1-3 years.  Mammogram. This may be done every 1-2 years. Talk with your health care provider about when you should start having regular mammograms. This may depend on whether you have a family history of breast cancer.  BRCA-related cancer screening. This may be done if you have a family history of breast, ovarian, tubal, or peritoneal cancers.  Pelvic exam and Pap test. This may be done every 3 years starting at age 80. Starting at age 75, this may be done every 5 years if you have a Pap test in combination with an HPV test. Other tests  Sexually  transmitted disease (STD) testing.  Bone density scan. This is done to screen for osteoporosis. You may have this scan if you are at high risk for osteoporosis. Follow these instructions at home: Eating and drinking  Eat a diet that includes fresh fruits and vegetables, whole grains, lean protein, and low-fat dairy.  Take vitamin and mineral supplements as recommended by your health care provider.  Do not drink alcohol if: ? Your health care provider tells you not to drink. ? You are pregnant, may be pregnant, or are planning to become pregnant.  If you drink alcohol: ? Limit how much you have to 0-1 drink a day. ? Be aware of how much alcohol is in your drink. In the [place] one drink equals one 12 oz bottle of beer (355 mL), one 5 oz glass of wine (148 mL), or one 1 oz glass of hard liquor (44 mL). Lifestyle  Take daily care of your teeth and gums.  Stay active. Exercise for at least 30 minutes on 5 or more days each week.  Do not use any products that contain nicotine or tobacco, such as cigarettes, e-cigarettes, and chewing tobacco. If you need help quitting, ask your health care provider.  If you are sexually active, practice safe sex. Use a condom or other form of birth control (contraception) in order to prevent pregnancy and STIs (sexually transmitted infections).  If told by your health care provider, take low-dose aspirin daily starting at age 13. What's next?  Visit your health care provider once a year for a well check visit.  Ask your health care provider how often you should have your eyes and teeth checked.  Stay up to date on all vaccines. This information is not intended to replace advice given to you by your health care provider. Make sure you discuss any questions you have with your health care provider. Document Revised: 03/28/2018 Document Reviewed: 03/28/2018 Elsevier Patient Education  [place]. Varenicline oral tablets What is this  medicine? VARENICLINE (var e NI  kleen) is used to help people quit smoking. It is used with a patient support program recommended by your physician. This medicine may be used for other purposes; ask your health care provider or pharmacist if you have questions. COMMON BRAND NAME(S): Chantix What should I tell my health care provider before I take this medicine? They need to know if you have any of these conditions:  heart disease  if you often drink alcohol  kidney disease  mental illness  on hemodialysis  seizures  history of stroke  suicidal thoughts, plans, or attempt; a previous suicide attempt by you or a family member  an unusual or allergic reaction to varenicline, other medicines, foods, dyes, or preservatives  pregnant or trying to get pregnant  breast-feeding How should I use this medicine? Take this medicine by mouth after eating. Take with a full glass of water. Follow the directions on the prescription label. Take your doses at regular intervals. Do not take your medicine more often than directed. There are 3 ways you can use this medicine to help you quit smoking; talk to your health care professional to decide which plan is right for you: 1) you can choose a quit date and start this medicine 1 week before the quit date, or, 2) you can start taking this medicine before you choose a quit date, and then pick a quit date between day 8 and 35 days of treatment, or, 3) if you are not sure that you are able or willing to quit smoking right away, start taking this medicine and slowly decrease the amount you smoke as directed by your health care professional with the goal of being cigarette-free by week 12 of treatment. Stick to your plan; ask about support groups or other ways to help you remain cigarette-free. If you are motivated to quit smoking and did not succeed during a previous attempt with this medicine for reasons other than side effects, or if you returned to  smoking after this treatment, speak with your health care professional about whether another course of this medicine may be right for you. A special MedGuide will be given to you by the pharmacist with each prescription and refill. Be sure to read this information carefully each time. Talk to your pediatrician regarding the use of this medicine in children. This medicine is not approved for use in children. Overdosage: If you think you have taken too much of this medicine contact a poison control center or emergency room at once. NOTE: This medicine is only for you. Do not share this medicine with others. What if I miss a dose? If you miss a dose, take it as soon as you can. If it is almost time for your next dose, take only that dose. Do not take double or extra doses. What may interact with this medicine?  alcohol  insulin  other medicines used to help people quit smoking  theophylline  warfarin This list may not describe all possible interactions. Give your health care provider a list of all the medicines, herbs, non-prescription drugs, or dietary supplements you use. Also tell them if you smoke, drink alcohol, or use illegal drugs. Some items may interact with your medicine. What should I watch for while using this medicine? It is okay if you do not succeed at your attempt to quit and have a cigarette. You can still continue your quit attempt and keep using this medicine as directed. Just throw away your cigarettes and get back to your quit  plan. Talk to your health care provider before using other treatments to quit smoking. Using this medicine with other treatments to quit smoking may increase the risk for side effects compared to using a treatment alone. You may get drowsy or dizzy. Do not drive, use machinery, or do anything that needs mental alertness until you know how this medicine affects you. Do not stand or sit up quickly, especially if you are an older patient. This reduces the  risk of dizzy or fainting spells. Decrease the number of alcoholic beverages that you drink during treatment with this medicine until you know if this medicine affects your ability to tolerate alcohol. Some people have experienced increased drunkenness (intoxication), unusual or sometimes aggressive behavior, or no memory of things that have happened (amnesia) during treatment with this medicine. Sleepwalking can happen during treatment with this medicine, and can sometimes lead to behavior that is harmful to you, other people, or property. Stop taking this medicine and tell your doctor if you start sleepwalking or have other unusual sleep-related activity. After taking this medicine, you may get up out of bed and do an activity that you do not know you are doing. The next morning, you may have no memory of this. Activities include driving a car ("sleep-driving"), making and eating food, talking on the phone, sexual activity, and sleep-walking. Serious injuries have occurred. Stop the medicine and call your doctor right away if you find out you have done any of these activities. Do not take this medicine if you have used alcohol that evening. Do not take it if you have taken another medicine for sleep. The risk of doing these sleep-related activities is higher. Patients and their families should watch out for new or worsening depression or thoughts of suicide. Also watch out for sudden changes in feelings such as feeling anxious, agitated, panicky, irritable, hostile, aggressive, impulsive, severely restless, overly excited and hyperactive, or not being able to sleep. If this happens, call your health care professional. If you have diabetes and you quit smoking, the effects of insulin may be increased and you may need to reduce your insulin dose. Check with your doctor or health care professional about how you should adjust your insulin dose. What side effects may I notice from receiving this medicine? Side  effects that you should report to your doctor or health care professional as soon as possible:  allergic reactions like skin rash, itching or hives, swelling of the face, lips, tongue, or throat  acting aggressive, being angry or violent, or acting on dangerous impulses  breathing problems  changes in emotions or moods  chest pain or chest tightness  feeling faint or lightheaded, falls  hallucination, loss of contact with reality  mouth sores  redness, blistering, peeling or loosening of the skin, including inside the mouth  signs and symptoms of a stroke like changes in vision; confusion; trouble speaking or understanding; severe headaches; sudden numbness or weakness of the face, arm or leg; trouble walking; dizziness; loss of balance or coordination  seizures  sleepwalking  suicidal thoughts or other mood changes Side effects that usually do not require medical attention (report to your doctor or health care professional if they continue or are bothersome):  constipation  gas  headache  nausea, vomiting  strange dreams  trouble sleeping This list may not describe all possible side effects. Call your doctor for medical advice about side effects. You may report side effects to FDA at 1-800-FDA-1088. Where should I keep my medicine?  Keep out of the reach of children. Store at room temperature between 15 and 30 degrees C (59 and 86 degrees F). Throw away any unused medicine after the expiration date. NOTE: This sheet is a summary. It may not cover all possible information. If you have questions about this medicine, talk to your doctor, pharmacist, or health care provider.  2020 Elsevier/Gold Standard (2018-07-05 14:27:36)  Managing Your Hypertension Hypertension is commonly called high blood pressure. This is when the force of your blood pressing against the walls of your arteries is too strong. Arteries are blood vessels that carry blood from your heart throughout  your body. Hypertension forces the heart to work harder to pump blood, and may cause the arteries to become narrow or stiff. Having untreated or uncontrolled hypertension can cause heart attack, stroke, kidney disease, and other problems. What are blood pressure readings? A blood pressure reading consists of a higher number over a lower number. Ideally, your blood pressure should be below 120/80. The first ("top") number is called the systolic pressure. It is a measure of the pressure in your arteries as your heart beats. The second ("bottom") number is called the diastolic pressure. It is a measure of the pressure in your arteries as the heart relaxes. What does my blood pressure reading mean? Blood pressure is classified into four stages. Based on your blood pressure reading, your health care provider may use the following stages to determine what type of treatment you need, if any. Systolic pressure and diastolic pressure are measured in a unit called mm Hg. Normal  Systolic pressure: below 659.  Diastolic pressure: below 80. Elevated  Systolic pressure: 935-701.  Diastolic pressure: below 80. Hypertension stage 1  Systolic pressure: 779-390.  Diastolic pressure: 30-09. Hypertension stage 2  Systolic pressure: 233 or above.  Diastolic pressure: 90 or above. What health risks are associated with hypertension? Managing your hypertension is an important responsibility. Uncontrolled hypertension can lead to:  A heart attack.  A stroke.  A weakened blood vessel (aneurysm).  Heart failure.  Kidney damage.  Eye damage.  Metabolic syndrome.  Memory and concentration problems. What changes can I make to manage my hypertension? Hypertension can be managed by making lifestyle changes and possibly by taking medicines. Your health care provider will help you make a plan to bring your blood pressure within a normal range. Eating and drinking   Eat a diet that is high in fiber and  potassium, and low in salt (sodium), added sugar, and fat. An example eating plan is called the DASH (Dietary Approaches to Stop Hypertension) diet. To eat this way: ? Eat plenty of fresh fruits and vegetables. Try to fill half of your plate at each meal with fruits and vegetables. ? Eat whole grains, such as whole wheat pasta, brown rice, or whole grain bread. Fill about one quarter of your plate with whole grains. ? Eat low-fat diary products. ? Avoid fatty cuts of meat, processed or cured meats, and poultry with skin. Fill about one quarter of your plate with lean proteins such as fish, chicken without skin, beans, eggs, and tofu. ? Avoid premade and processed foods. These tend to be higher in sodium, added sugar, and fat.  Reduce your daily sodium intake. Most people with hypertension should eat less than 1,500 mg of sodium a day.  Limit alcohol intake to no more than 1 drink a day for nonpregnant women and 2 drinks a day for men. One drink equals 12 oz of beer,  5 oz of wine, or 1 oz of hard liquor. Lifestyle  Work with your health care provider to maintain a healthy body weight, or to lose weight. Ask what an ideal weight is for you.  Get at least 30 minutes of exercise that causes your heart to beat faster (aerobic exercise) most days of the week. Activities may include walking, swimming, or biking.  Include exercise to strengthen your muscles (resistance exercise), such as weight lifting, as part of your weekly exercise routine. Try to do these types of exercises for 30 minutes at least 3 days a week.  Do not use any products that contain nicotine or tobacco, such as cigarettes and e-cigarettes. If you need help quitting, ask your health care provider.  Control any long-term (chronic) conditions you have, such as high cholesterol or diabetes. Monitoring  Monitor your blood pressure at home as told by your health care provider. Your personal target blood pressure may vary depending on  your medical conditions, your age, and other factors.  Have your blood pressure checked regularly, as often as told by your health care provider. Working with your health care provider  Review all the medicines you take with your health care provider because there may be side effects or interactions.  Talk with your health care provider about your diet, exercise habits, and other lifestyle factors that may be contributing to hypertension.  Visit your health care provider regularly. Your health care provider can help you create and adjust your plan for managing hypertension. Will I need medicine to control my blood pressure? Your health care provider may prescribe medicine if lifestyle changes are not enough to get your blood pressure under control, and if:  Your systolic blood pressure is 130 or higher.  Your diastolic blood pressure is 80 or higher. Take medicines only as told by your health care provider. Follow the directions carefully. Blood pressure medicines must be taken as prescribed. The medicine does not work as well when you skip doses. Skipping doses also puts you at risk for problems. Contact a health care provider if:  You think you are having a reaction to medicines you have taken.  You have repeated (recurrent) headaches.  You feel dizzy.  You have swelling in your ankles.  You have trouble with your vision. Get help right away if:  You develop a severe headache or confusion.  You have unusual weakness or numbness, or you feel faint.  You have severe pain in your chest or abdomen.  You vomit repeatedly.  You have trouble breathing. Summary  Hypertension is when the force of blood pumping through your arteries is too strong. If this condition is not controlled, it may put you at risk for serious complications.  Your personal target blood pressure may vary depending on your medical conditions, your age, and other factors. For most people, a normal blood  pressure is less than 120/80.  Hypertension is managed by lifestyle changes, medicines, or both. Lifestyle changes include weight loss, eating a healthy, low-sodium diet, exercising more, and limiting alcohol. This information is not intended to replace advice given to you by your health care provider. Make sure you discuss any questions you have with your health care provider. Document Revised: 11/08/2018 Document Reviewed: 06/14/2016 Elsevier Patient Education  [place].

## 2019-12-23 NOTE — Progress Notes (Addendum)
New Patient Office Visit  Subjective:  Patient ID: Valerie Russell, female    DOB: [birth year]  Age: 59 y.o. MRN: [id number]  CC:  Chief Complaint  Patient presents with  . Establish Care    New patient, CPE    HPI Valerie Russell presents for establishment of care physical exam and follow-up of some of her medical issues.  She has been lost to follow-up due to lack of health insurance.  She is interested in getting caught up with her health maintenance.  Past medical history of hypertension that has not been treated in the past.  She smokes a pack of cigarettes daily and has been smoking since age 40.  She knows that she needs to quit.  She is experiences shortness of breath from time to time.  She has completed her Covid vaccination series.  She has been stressed.  Her son is a heroin addict and is active in his addiction.  She feels as though she has attention deficit disorder. She works as a Chartered certified accountant.   Past Medical History:  Diagnosis Date  . Diverticulitis   . Hypertension   . Seasonal allergies     Past Surgical History:  Procedure Laterality Date  . APPENDECTOMY      Family History  Problem Relation Age of Onset  . Cancer Other   . Hypertension Mother   . COPD Mother   . Stroke Mother     Social History   Socioeconomic History  . Marital status: Single    Spouse name: Not on file  . Number of children: Not on file  . Years of education: Not on file  . Highest education level: Not on file  Occupational History  . Not on file  Tobacco Use  . Smoking status: Current Every Day Smoker    Packs/day: 1.00    Years: 25.00    Pack years: 25.00    Types: Cigarettes  . Smokeless tobacco: Never Used  Vaping Use  . Vaping Use: Never used  Substance and Sexual Activity  . Alcohol use: Yes    Alcohol/week: 1.0 standard drink    Types: 1 Glasses of wine per week    Comment: occasionally  . Drug use: Yes    Types: Marijuana    Comment: sometimes  . Sexual  activity: Yes    Birth control/protection: None  Other Topics Concern  . Not on file  Social History Narrative  . Not on file   Social Determinants of Health   Financial Resource Strain:   . Difficulty of Paying Living Expenses:   Food Insecurity:   . Worried About Charity fundraiser in the Last Year:   . Arboriculturist in the Last Year:   Transportation Needs:   . Film/video editor (Medical):   Marland Kitchen Lack of Transportation (Non-Medical):   Physical Activity:   . Days of Exercise per Week:   . Minutes of Exercise per Session:   Stress:   . Feeling of Stress :   Social Connections:   . Frequency of Communication with Friends and Family:   . Frequency of Social Gatherings with Friends and Family:   . Attends Religious Services:   . Active Member of Clubs or Organizations:   . Attends Archivist Meetings:   Marland Kitchen Marital Status:   Intimate Partner Violence:   . Fear of Current or Ex-Partner:   . Emotionally Abused:   Marland Kitchen Physically Abused:   . Sexually Abused:  ROS Review of Systems  Constitutional: Negative for chills, diaphoresis, fatigue, fever and unexpected weight change.  HENT: Negative.   Eyes: Negative for photophobia and visual disturbance.  Respiratory: Positive for cough and shortness of breath. Negative for chest tightness.   Cardiovascular: Negative for chest pain and palpitations.  Gastrointestinal: Negative.   Endocrine: Negative for polyphagia and polyuria.  Genitourinary: Negative.   Musculoskeletal: Negative for gait problem and joint swelling.  Skin: Negative for pallor and rash.  Allergic/Immunologic: Negative for immunocompromised state.  Neurological: Negative for tremors, speech difficulty and light-headedness.   Depression screen [facility] 2/9 12/23/2019  Decreased Interest 1  Down, Depressed, Hopeless 0  PHQ - 2 Score 1  Altered sleeping 0  Tired, decreased energy 1  Change in appetite 1  Feeling bad or failure about yourself  0    Trouble concentrating 1  Moving slowly or fidgety/restless 0  Suicidal thoughts 0  PHQ-9 Score 4  Difficult doing work/chores Not difficult at all    Objective:   Today's Vitals: BP (!) 160/82   Pulse (!) 103   Temp (!) 97.4 F (36.3 C) (Tympanic)   Ht 5\' 3"  (1.6 m) Comment: 5 3 3/4 inches  Wt 142 lb (64.4 kg)   SpO2 97%   BMI 25.15 kg/m   Physical Exam Vitals reviewed.  Constitutional:      General: She is not in acute distress.    Appearance: Normal appearance. She is normal weight. She is not ill-appearing, toxic-appearing or diaphoretic.  HENT:     Head: Normocephalic and atraumatic.     Right Ear: Tympanic membrane, ear canal and external ear normal.     Left Ear: Tympanic membrane, ear canal and external ear normal.     Nose: Nose normal.     Mouth/Throat:     Mouth: Mucous membranes are moist.     Pharynx: Oropharynx is clear. No oropharyngeal exudate.  Eyes:     General: No scleral icterus.       Right eye: No discharge.        Left eye: No discharge.     Extraocular Movements: Extraocular movements intact.     Conjunctiva/sclera: Conjunctivae normal.     Pupils: Pupils are equal, round, and reactive to light.  Neck:     Vascular: No carotid bruit.  Cardiovascular:     Rate and Rhythm: Normal rate and regular rhythm.     Pulses:          Carotid pulses are 2+ on the right side and 2+ on the left side.      Dorsalis pedis pulses are 2+ on the right side and 2+ on the left side.       Posterior tibial pulses are 2+ on the right side and 2+ on the left side.  Pulmonary:     Effort: Pulmonary effort is normal. No respiratory distress.     Breath sounds: Decreased air movement present. No stridor. Decreased breath sounds present.  Abdominal:     General: Bowel sounds are normal.  Musculoskeletal:     Cervical back: No rigidity or tenderness.     Right lower leg: No edema.     Left lower leg: No edema.  Lymphadenopathy:     Cervical: No cervical  adenopathy.  Skin:    General: Skin is warm and dry.     Coloration: Skin is not jaundiced.  Neurological:     Mental Status: She is alert and oriented to person, place, and  time.  Psychiatric:        Mood and Affect: Mood normal.        Behavior: Behavior normal.     Assessment & Plan:   Problem List Items Addressed This Visit      Cardiovascular and Mediastinum   Essential hypertension - Primary   Relevant Medications   lisinopril (ZESTRIL) 20 MG tablet   Other Relevant Orders   CBC (Completed)   Comprehensive metabolic panel (Completed)   Urinalysis, Routine w reflex microscopic (Completed)     Respiratory   Asthmatic bronchitis     Other   Healthcare maintenance   Relevant Orders   LDL cholesterol, direct (Completed)   Lipid panel (Completed)   Ambulatory referral to Gynecology   Ambulatory referral to Gastroenterology   Tobacco abuse   Dysthymia      Outpatient Encounter Medications as of 12/23/2019  Medication Sig  . cetirizine (ZYRTEC) 10 MG tablet Take 10 mg by mouth daily.  . [DISCONTINUED] albuterol (VENTOLIN HFA) 108 (90 Base) MCG/ACT inhaler Inhale 1 puff into the lungs every 6 (six) hours as needed for wheezing or shortness of breath.  . lisinopril (ZESTRIL) 20 MG tablet Take 1 tablet (20 mg total) by mouth daily.  . pseudoephedrine (SUDAFED 12 HOUR) 120 MG 12 hr tablet Take 1 tablet (120 mg total) by mouth 2 (two) times daily. Use twice daily for five days (Patient not taking: Reported on 12/23/2019)  . [DISCONTINUED] acetaminophen (TYLENOL) 500 MG tablet Take 2 tablets (1,000 mg total) by mouth every 6 (six) hours as needed. (Patient not taking: Reported on 12/23/2019)  . [DISCONTINUED] predniSONE (DELTASONE) 20 MG tablet Take 2 tablets (40 mg total) by mouth daily with breakfast. For the next four days (Patient not taking: Reported on 12/23/2019)   No facility-administered encounter medications on file as of 12/23/2019.    Follow-up: Return in about 1  month (around 01/23/2020).  Will start lisinopril 20 mg daily.  Patient to follow-up in 1 month.  We discussed tobacco cessation I urged her to stop.  She was given information on Chantix, steps to quit smoking as well as health maintenance and disease prevention.  Was also given information on managing hypertension.  May consider Wellbutrin at next visit.  Libby Maw, MD

## 2019-12-24 ENCOUNTER — Encounter: Payer: Self-pay | Admitting: Gastroenterology

## 2020-01-26 ENCOUNTER — Ambulatory Visit: Payer: 59 | Admitting: Family Medicine

## 2020-01-27 ENCOUNTER — Other Ambulatory Visit: Payer: Self-pay

## 2020-01-28 ENCOUNTER — Ambulatory Visit: Payer: 59 | Admitting: Family Medicine

## 2020-01-28 ENCOUNTER — Ambulatory Visit (INDEPENDENT_AMBULATORY_CARE_PROVIDER_SITE_OTHER): Payer: 59

## 2020-01-28 ENCOUNTER — Encounter: Payer: Self-pay | Admitting: Family Medicine

## 2020-01-28 VITALS — BP 142/78 | HR 96 | Temp 98.1°F | Ht 63.0 in | Wt 138.2 lb

## 2020-01-28 DIAGNOSIS — Z0000 Encounter for general adult medical examination without abnormal findings: Secondary | ICD-10-CM

## 2020-01-28 DIAGNOSIS — E7800 Pure hypercholesterolemia, unspecified: Secondary | ICD-10-CM

## 2020-01-28 DIAGNOSIS — F341 Dysthymic disorder: Secondary | ICD-10-CM

## 2020-01-28 DIAGNOSIS — I10 Essential (primary) hypertension: Secondary | ICD-10-CM

## 2020-01-28 DIAGNOSIS — D509 Iron deficiency anemia, unspecified: Secondary | ICD-10-CM | POA: Diagnosis not present

## 2020-01-28 DIAGNOSIS — M79604 Pain in right leg: Secondary | ICD-10-CM

## 2020-01-28 DIAGNOSIS — J4541 Moderate persistent asthma with (acute) exacerbation: Secondary | ICD-10-CM

## 2020-01-28 DIAGNOSIS — Z720 Tobacco use: Secondary | ICD-10-CM

## 2020-01-28 DIAGNOSIS — M79605 Pain in left leg: Secondary | ICD-10-CM

## 2020-01-28 MED ORDER — DOXYCYCLINE HYCLATE 100 MG PO TABS: 100.0000 mg | ORAL_TABLET | Freq: Two times a day (BID) | ORAL | 0 refills | Status: DC

## 2020-01-28 MED ORDER — BUPROPION HCL 75 MG PO TABS: ORAL_TABLET | ORAL | 1 refills | Status: DC

## 2020-01-28 MED ORDER — PREDNISONE 20 MG PO TABS: 20.0000 mg | ORAL_TABLET | Freq: Two times a day (BID) | ORAL | 0 refills | Status: AC

## 2020-01-28 MED ORDER — CHLORTHALIDONE 25 MG PO TABS: 25.0000 mg | ORAL_TABLET | Freq: Every day | ORAL | 1 refills | Status: DC

## 2020-01-28 MED ORDER — ALBUTEROL SULFATE HFA 108 (90 BASE) MCG/ACT IN AERS: 1.0000 | INHALATION_SPRAY | Freq: Four times a day (QID) | RESPIRATORY_TRACT | 0 refills | Status: DC | PRN

## 2020-01-28 NOTE — Progress Notes (Addendum)
Established Patient Office Visit  Subjective:  Patient ID: Valerie Russell, female    DOB: 25-Aug-1960  Age: 59 y.o. MRN: [id number]  CC:  Chief Complaint  Patient presents with  . Follow-up    1 month follow up, patient states that her legs ache a lot.     HPI Valerie Russell presents for follow-up of hypertension, dysthymia, tobacco use, microcytic, cough with wheezing productive of brown phlegm.  She has been experiencing pain in both of her legs with ambulation.  Gets better shortly after she rests.  She did see the GYN doctor.  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  Mammogram was okay she tells me.  She has an appointment scheduled with gastroenterology to follow-up on her reflux to disease, possible dysphagia and for colonoscopy.  Stressed the importance of the colonoscopy for her history of microcytic anemia.  Denies hematuria or blood in her stool but has noted dark tarry stools. Upcomming GI appt.  She has had a cough productive of brown phlegm.  She has been using her fiancs albuterol inhaler.  Tells me that her chest x-ray taken back in 2019 showed a vertebral compression back fracture in her thoracic spine.  Past Medical History:  Diagnosis Date  . Diverticulitis   . Hypertension   . Seasonal allergies     Past Surgical History:  Procedure Laterality Date  . APPENDECTOMY      Family History  Problem Relation Age of Onset  . Cancer Other   . Hypertension Mother   . COPD Mother   . Stroke Mother     Social History   Socioeconomic History  . Marital status: Single    Spouse name: Not on file  . Number of children: Not on file  . Years of education: Not on file  . Highest education level: Not on file  Occupational History  . Not on file  Tobacco Use  . Smoking status: Current Every Day Smoker    Packs/day: 1.00    Years: 25.00    Pack years: 25.00    Types: Cigarettes  . Smokeless tobacco: Never Used  Vaping Use  . Vaping Use:  Never used  Substance and Sexual Activity  . Alcohol use: Yes    Alcohol/week: 1.0 standard drink    Types: 1 Glasses of wine per week    Comment: occasionally  . Drug use: Yes    Types: Marijuana    Comment: sometimes  . Sexual activity: Yes    Birth control/protection: None  Other Topics Concern  . Not on file  Social History Narrative  . Not on file   Social Determinants of Health   Financial Resource Strain:   . Difficulty of Paying Living Expenses:   Food Insecurity:   . Worried About Charity fundraiser in the Last Year:   . Arboriculturist in the Last Year:   Transportation Needs:   . Film/video editor (Medical):   Marland Kitchen Lack of Transportation (Non-Medical):   Physical Activity:   . Days of Exercise per Week:   . Minutes of Exercise per Session:   Stress:   . Feeling of Stress :   Social Connections:   . Frequency of Communication with Friends and Family:   . Frequency of Social Gatherings with Friends and Family:   . Attends Religious Services:   . Active Member of Clubs or Organizations:   . Attends Archivist Meetings:   .  Marital Status:   Intimate Partner Violence:   . Fear of Current or Ex-Partner:   . Emotionally Abused:   Marland Kitchen Physically Abused:   . Sexually Abused:     Outpatient Medications Prior to Visit  Medication Sig Dispense Refill  . cetirizine (ZYRTEC) 10 MG tablet Take 10 mg by mouth daily.    . folic acid (FOLVITE) 1 MG tablet Take 1 mg by mouth daily.    Marland Kitchen lisinopril (ZESTRIL) 20 MG tablet Take 1 tablet (20 mg total) by mouth daily. 90 tablet 0  . albuterol (VENTOLIN HFA) 108 (90 Base) MCG/ACT inhaler Inhale 1 puff into the lungs every 6 (six) hours as needed for wheezing or shortness of breath.    . pseudoephedrine (SUDAFED 12 HOUR) 120 MG 12 hr tablet Take 1 tablet (120 mg total) by mouth 2 (two) times daily. Use twice daily for five days (Patient not taking: Reported on 12/23/2019) 10 tablet 0  . acetaminophen (TYLENOL) 500 MG  tablet Take 2 tablets (1,000 mg total) by mouth every 6 (six) hours as needed. (Patient not taking: Reported on 12/23/2019) 30 tablet 0  . predniSONE (DELTASONE) 20 MG tablet Take 2 tablets (40 mg total) by mouth daily with breakfast. For the next four days (Patient not taking: Reported on 12/23/2019) 8 tablet 0   No facility-administered medications prior to visit.    No Known Allergies  ROS Review of Systems  Constitutional: Negative for chills, diaphoresis, fatigue, fever and unexpected weight change.  HENT: Negative.   Eyes: Negative for photophobia and visual disturbance.  Respiratory: Positive for cough and wheezing.   Cardiovascular: Negative for chest pain.  Gastrointestinal: Negative for anal bleeding and blood in stool.  Endocrine: Negative for polyphagia and polyuria.  Genitourinary: Negative for hematuria.  Musculoskeletal: Positive for back pain.  Allergic/Immunologic: Negative for immunocompromised state.  Neurological: Negative for weakness and numbness.  Hematological: Does not bruise/bleed easily.   Depression screen [facility] 2/9 12/23/2019  Decreased Interest 1  Down, Depressed, Hopeless 0  PHQ - 2 Score 1  Altered sleeping 0  Tired, decreased energy 1  Change in appetite 1  Feeling bad or failure about yourself  0  Trouble concentrating 1  Moving slowly or fidgety/restless 0  Suicidal thoughts 0  PHQ-9 Score 4  Difficult doing work/chores Not difficult at all      Objective:    Physical Exam Vitals and nursing note reviewed.  Constitutional:      General: She is not in acute distress.    Appearance: Normal appearance. She is normal weight. She is not ill-appearing, toxic-appearing or diaphoretic.  HENT:     Head: Normocephalic and atraumatic.     Right Ear: External ear normal.     Left Ear: External ear normal.     Mouth/Throat:     Mouth: Mucous membranes are moist.     Pharynx: Oropharynx is clear. No oropharyngeal exudate or posterior oropharyngeal  erythema.  Eyes:     General:        Right eye: No discharge.        Left eye: No discharge.  Cardiovascular:     Rate and Rhythm: Normal rate and regular rhythm.     Pulses:          Dorsalis pedis pulses are 1+ on the right side and 1+ on the left side.       Posterior tibial pulses are 1+ on the right side and 1+ on the left side.  Pulmonary:  Effort: Pulmonary effort is normal.     Breath sounds: No wheezing, rhonchi or rales.  Abdominal:     General: Bowel sounds are normal.  Musculoskeletal:     Cervical back: No rigidity or tenderness.     Lumbar back: No tenderness. Normal range of motion.     Right lower leg: No edema.     Left lower leg: No edema.  Lymphadenopathy:     Cervical: No cervical adenopathy.  Skin:    Capillary Refill: Capillary refill takes 2 to 3 seconds.  Neurological:     General: No focal deficit present.     Mental Status: She is alert and oriented to person, place, and time.  Psychiatric:        Mood and Affect: Mood normal.        Behavior: Behavior normal.     BP (!) 142/78   Pulse 96   Temp 98.1 F (36.7 C) (Tympanic)   Ht 5\' 3"  (1.6 m)   Wt 138 lb 3.2 oz (62.7 kg)   SpO2 97%   BMI 24.48 kg/m  Wt Readings from Last 3 Encounters:  01/28/20 138 lb 3.2 oz (62.7 kg)  12/23/19 142 lb (64.4 kg)  11/08/15 130 lb (59 kg)     Health Maintenance Due  Topic Date Due  . Hepatitis C Screening  Never done  . HIV Screening  Never done  . PAP SMEAR-Modifier  Never done  . MAMMOGRAM  Never done  . COLONOSCOPY  Never done    There are no preventive care reminders to display for this patient.  No results found for: TSH Lab Results  Component Value Date   WBC 7.2 12/23/2019   HGB 9.9 (L) 12/23/2019   HCT 31.8 (L) 12/23/2019   MCV 76.7 (L) 12/23/2019   PLT 377.0 12/23/2019   Lab Results  Component Value Date   NA 140 12/23/2019   K 4.7 12/23/2019   CO2 26 12/23/2019   GLUCOSE 107 (H) 12/23/2019   BUN 12 12/23/2019   CREATININE  0.85 12/23/2019   BILITOT 0.3 12/23/2019   ALKPHOS 121 (H) 12/23/2019   AST 19 12/23/2019   ALT 17 12/23/2019   PROT 7.4 12/23/2019   ALBUMIN 4.4 12/23/2019   CALCIUM 9.5 12/23/2019   ANIONGAP 12 08/23/2015   GFR 68.33 12/23/2019   Lab Results  Component Value Date   CHOL 222 (H) 12/23/2019   Lab Results  Component Value Date   HDL 58.40 12/23/2019   Lab Results  Component Value Date   LDLCALC 142 (H) 12/23/2019   Lab Results  Component Value Date   TRIG 104.0 12/23/2019   Lab Results  Component Value Date   CHOLHDL 4 12/23/2019   No results found for: HGBA1C    Assessment & Plan:   Problem List Items Addressed This Visit      Cardiovascular and Mediastinum   Essential hypertension - Primary   Relevant Medications   chlorthalidone (HYGROTON) 25 MG tablet   Other Relevant Orders   VAS [place] ABI WITH/WO TBI     Respiratory   Asthmatic bronchitis   Relevant Medications   doxycycline (VIBRA-TABS) 100 MG tablet   albuterol (VENTOLIN HFA) 108 (90 Base) MCG/ACT inhaler   predniSONE (DELTASONE) 20 MG tablet   Other Relevant Orders   DG Chest 2 View (Completed)     Other   Healthcare maintenance   Relevant Orders   DG Bone Density   Iron deficiency anemia   Relevant  Medications   folic acid (FOLVITE) 1 MG tablet   Iron, Ferrous Gluconate, 256 (28 Fe) MG TABS   Tobacco abuse   Relevant Medications   buPROPion (WELLBUTRIN) 75 MG tablet   Elevated LDL cholesterol level   Pain in both lower extremities   Relevant Orders   VAS [place] ABI WITH/WO TBI   Dysthymia   Relevant Medications   buPROPion (WELLBUTRIN) 75 MG tablet      Meds ordered this encounter  Medications  . buPROPion (WELLBUTRIN) 75 MG tablet    Sig: Take one each morning for a week. Then, start a second pill mid afternoon.    Dispense:  60 tablet    Refill:  1  . doxycycline (VIBRA-TABS) 100 MG tablet    Sig: Take 1 tablet (100 mg total) by mouth 2 (two) times daily.    Dispense:  20 tablet      Refill:  0  . albuterol (VENTOLIN HFA) 108 (90 Base) MCG/ACT inhaler    Sig: Inhale 1-2 puffs into the lungs every 6 (six) hours as needed for wheezing or shortness of breath.    Dispense:  18 g    Refill:  0  . predniSONE (DELTASONE) 20 MG tablet    Sig: Take 1 tablet (20 mg total) by mouth 2 (two) times daily with a meal for 7 days.    Dispense:  14 tablet    Refill:  0  . chlorthalidone (HYGROTON) 25 MG tablet    Sig: Take 1 tablet (25 mg total) by mouth daily.    Dispense:  90 tablet    Refill:  1  . Iron, Ferrous Gluconate, 256 (28 Fe) MG TABS    Sig: Take one tablet daily.    Dispense:  90 tablet    Refill:  2    Follow-up: Return in about 5 weeks (around 03/03/2020).  Start Wellbutrin to help with dysthymia and smoking cessation.  Adding chlorthalidone for better blood pressure control.  May need iron therapy.  Libby Maw, MD

## 2020-01-29 LAB — IRON,TIBC AND FERRITIN PANEL
%SAT: 7 % (calc) — ABNORMAL LOW (ref 16–45)
Ferritin: 7 ng/mL — ABNORMAL LOW (ref 16–232)
Iron: 32 ug/dL — ABNORMAL LOW (ref 45–160)
TIBC: 446 mcg/dL (calc) (ref 250–450)

## 2020-01-29 MED ORDER — IRON (FERROUS GLUCONATE) 256 (28 FE) MG PO TABS: ORAL_TABLET | ORAL | 2 refills | Status: DC

## 2020-01-29 NOTE — Addendum Note (Signed)
Addended by: Jon Billings on: 01/29/2020 07:29 AM   Modules accepted: Orders

## 2020-02-03 ENCOUNTER — Telehealth: Payer: Self-pay | Admitting: Family Medicine

## 2020-02-03 NOTE — Telephone Encounter (Signed)
Patient would like to speak with nurse about possible symptoms from new iron pills. She states she is having stomach cramps and dark stools.

## 2020-02-03 NOTE — Telephone Encounter (Signed)
Patient is calling back regarding previous message left. CB is [phone]

## 2020-02-03 NOTE — Telephone Encounter (Signed)
Spoke with patient who verbally understood this was normal for stools to be dark in color with some stomach cramps and also constipations while on medications. Per patient she will give [place] a cal if this becomes unbearable.

## 2020-02-04 ENCOUNTER — Ambulatory Visit (INDEPENDENT_AMBULATORY_CARE_PROVIDER_SITE_OTHER): Payer: 59 | Admitting: Pulmonary Disease

## 2020-02-04 ENCOUNTER — Other Ambulatory Visit: Payer: Self-pay

## 2020-02-04 ENCOUNTER — Encounter: Payer: Self-pay | Admitting: Pulmonary Disease

## 2020-02-04 VITALS — BP 140/82 | HR 102 | Temp 97.8°F | Ht 63.5 in | Wt 136.4 lb

## 2020-02-04 DIAGNOSIS — R0600 Dyspnea, unspecified: Secondary | ICD-10-CM

## 2020-02-04 DIAGNOSIS — J449 Chronic obstructive pulmonary disease, unspecified: Secondary | ICD-10-CM

## 2020-02-04 MED ORDER — SPIRIVA RESPIMAT 2.5 MCG/ACT IN AERS: 2.0000 | INHALATION_SPRAY | Freq: Every day | RESPIRATORY_TRACT | 3 refills | Status: DC

## 2020-02-04 MED ORDER — SPIRIVA RESPIMAT 2.5 MCG/ACT IN AERS
2.0000 | INHALATION_SPRAY | Freq: Every day | RESPIRATORY_TRACT | 0 refills | Status: DC
Start: 2020-02-04 — End: 2022-05-17

## 2020-02-04 NOTE — Patient Instructions (Signed)
Low-dose CT screening  PFT  Spiriva-to be used daily  Continue albuterol as needed  Will follow-up in 6 weeks  Call with significant concerns

## 2020-02-04 NOTE — Progress Notes (Signed)
Valerie Russell    [id number]    02-Feb-1961  Primary Care Physician:Kremer, Mortimer Fries, MD  Referring Physician: Libby Maw, [place],  [place] [address]  Chief complaint:   Shortness of breath  HPI:  Shortness of breath which is progressive Did have Covid in February 2020 Just never really recovered  She does have a regular cough Uses albuterol as needed, uses about once a day  Denies any chest pains or chest discomfort No weight loss  Pack-a-day smoker 40-pack-year smoking history  Only able to walk about a block or block and a half Tolerates about 5-6 steps  Did not have a history of asthma growing up Did have recurrent seasonal allergies  No PFT on record  No pertinent occupational history  Outpatient Encounter Medications as of 02/04/2020  Medication Sig  . albuterol (VENTOLIN HFA) 108 (90 Base) MCG/ACT inhaler Inhale 1-2 puffs into the lungs every 6 (six) hours as needed for wheezing or shortness of breath.  Marland Kitchen buPROPion (WELLBUTRIN) 75 MG tablet Take one each morning for a week. Then, start a second pill mid afternoon.  . cetirizine (ZYRTEC) 10 MG tablet Take 10 mg by mouth daily.  . chlorthalidone (HYGROTON) 25 MG tablet Take 1 tablet (25 mg total) by mouth daily.  Marland Kitchen doxycycline (VIBRA-TABS) 100 MG tablet Take 1 tablet (100 mg total) by mouth 2 (two) times daily.  . folic acid (FOLVITE) 1 MG tablet Take 1 mg by mouth daily.  . Iron, Ferrous Gluconate, 256 (28 Fe) MG TABS Take one tablet daily.  Marland Kitchen lisinopril (ZESTRIL) 20 MG tablet Take 1 tablet (20 mg total) by mouth daily.  . predniSONE (DELTASONE) 20 MG tablet Take 1 tablet (20 mg total) by mouth 2 (two) times daily with a meal for 7 days.  . pseudoephedrine (SUDAFED 12 HOUR) 120 MG 12 hr tablet Take 1 tablet (120 mg total) by mouth 2 (two) times daily. Use twice daily for five days   No facility-administered encounter medications on file as of 02/04/2020.     Allergies as of 02/04/2020  . (No Known Allergies)    Past Medical History:  Diagnosis Date  . Diverticulitis   . Hypertension   . Seasonal allergies     Past Surgical History:  Procedure Laterality Date  . APPENDECTOMY      Family History  Problem Relation Age of Onset  . Cancer Other   . Hypertension Mother   . COPD Mother   . Stroke Mother     Social History   Socioeconomic History  . Marital status: Single    Spouse name: Not on file  . Number of children: Not on file  . Years of education: Not on file  . Highest education level: Not on file  Occupational History  . Not on file  Tobacco Use  . Smoking status: Current Every Day Smoker    Packs/day: 1.00    Years: 25.00    Pack years: 25.00    Types: Cigarettes  . Smokeless tobacco: Never Used  . Tobacco comment: 1/2 pack per day  Vaping Use  . Vaping Use: Never used  Substance and Sexual Activity  . Alcohol use: Yes    Alcohol/week: 1.0 standard drink    Types: 1 Glasses of wine per week    Comment: occasionally  . Drug use: Yes    Types: Marijuana    Comment: sometimes  . Sexual activity: Yes  Birth control/protection: None  Other Topics Concern  . Not on file  Social History Narrative  . Not on file   Social Determinants of Health   Financial Resource Strain:   . Difficulty of Paying Living Expenses:   Food Insecurity:   . Worried About Charity fundraiser in the Last Year:   . Arboriculturist in the Last Year:   Transportation Needs:   . Film/video editor (Medical):   Marland Kitchen Lack of Transportation (Non-Medical):   Physical Activity:   . Days of Exercise per Week:   . Minutes of Exercise per Session:   Stress:   . Feeling of Stress :   Social Connections:   . Frequency of Communication with Friends and Family:   . Frequency of Social Gatherings with Friends and Family:   . Attends Religious Services:   . Active Member of Clubs or Organizations:   . Attends Theatre manager Meetings:   Marland Kitchen Marital Status:   Intimate Partner Violence:   . Fear of Current or Ex-Partner:   . Emotionally Abused:   Marland Kitchen Physically Abused:   . Sexually Abused:     Review of Systems  Constitutional: Negative.  Negative for fatigue.  Respiratory: Positive for cough and shortness of breath.   Musculoskeletal: Negative.     Vitals:   02/04/20 1051  BP: 140/82  Pulse: (!) 102  Temp: 97.8 F (36.6 C)  SpO2: 97%     Physical Exam Constitutional:      Appearance: Normal appearance.  HENT:     Nose: No congestion.     Mouth/Throat:     Mouth: Mucous membranes are moist.     Pharynx: No oropharyngeal exudate.  Eyes:     General:        Right eye: No discharge.        Left eye: No discharge.     Pupils: Pupils are equal, round, and reactive to light.  Cardiovascular:     Rate and Rhythm: Normal rate and regular rhythm.     Pulses: Normal pulses.     Heart sounds: Normal heart sounds. No murmur heard.  No friction rub.  Pulmonary:     Effort: Pulmonary effort is normal. No respiratory distress.     Breath sounds: No stridor. No wheezing, rhonchi or rales.  Abdominal:     General: Abdomen is flat.  Musculoskeletal:     Cervical back: No rigidity or tenderness.  Skin:    General: Skin is warm.  Neurological:     General: No focal deficit present.     Mental Status: She is alert.  Psychiatric:        Mood and Affect: Mood normal.      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

## 2020-02-05 ENCOUNTER — Other Ambulatory Visit: Payer: Self-pay

## 2020-02-05 ENCOUNTER — Ambulatory Visit (AMBULATORY_SURGERY_CENTER): Payer: Self-pay

## 2020-02-05 VITALS — Ht 63.0 in | Wt 137.0 lb

## 2020-02-05 DIAGNOSIS — Z1211 Encounter for screening for malignant neoplasm of colon: Secondary | ICD-10-CM

## 2020-02-05 MED ORDER — NA SULFATE-K SULFATE-MG SULF 17.5-3.13-1.6 GM/177ML PO SOLN
1.0000 | Freq: Once | ORAL | 0 refills | Status: AC
Start: 2020-02-05 — End: 2020-02-05

## 2020-02-05 NOTE — Progress Notes (Signed)
No egg or soy allergy known to patient  No issues with past sedation with any surgeries  or procedures, no intubation problems  No diet pills per patient No home 02 use per patient  No blood thinners per patient  Pt denies issues with constipation  No A fib or A flutter  EMMI video sent to pt's e mail  COVID 19 guidelines implemented in [facility] today   PT [place].  Pt is a smoker, instructed no smoking for 3 hours prior to the procedure, pt smokes marijuana, instructed no marijuana the day of the procedure.  Due to the COVID-19 pandemic we are asking patients to follow these guidelines. Please only bring one care partner. Please be aware that your care partner may wait in the car in the parking lot or if they feel like they will be too hot to wait in the car, they may wait in the lobby on the 4th floor. All care partners are required to wear a mask the entire time (we do not have any that we can provide them), they need to practice social distancing, and we will do a Covid check for all patient's and care partners when you arrive. Also we will check their temperature and your temperature. If the care partner waits in their car they need to stay in the parking lot the entire time and we will call them on their cell phone when the patient is ready for discharge so they can bring the car to the front of the building. Also all patient's will need to wear a mask into building.

## 2020-02-06 ENCOUNTER — Ambulatory Visit (HOSPITAL_COMMUNITY): Admission: RE | Admit: 2020-02-06 | Payer: 59 | Source: Ambulatory Visit

## 2020-02-09 ENCOUNTER — Telehealth: Payer: Self-pay

## 2020-02-09 ENCOUNTER — Ambulatory Visit (HOSPITAL_COMMUNITY): Admission: RE | Admit: 2020-02-09 | Payer: 59 | Source: Ambulatory Visit

## 2020-02-09 NOTE — Telephone Encounter (Signed)
Patient calling with concerns regarding her blood pressure, per patient for the past 2 days her BP have ran low and this morning it was 80/63 she's not sure if this is her medications causing this or what. She also states that she feels dizzy sometimes when she gets up and weak at times. Patient states that she will not take her medicine this morning and see how she feels. She would like your opinion on what she should do. Please advise

## 2020-02-09 NOTE — Telephone Encounter (Signed)
Needs to be seen

## 2020-02-10 ENCOUNTER — Other Ambulatory Visit: Payer: Self-pay | Admitting: Family Medicine

## 2020-02-10 ENCOUNTER — Encounter: Payer: 59 | Admitting: Gastroenterology

## 2020-02-10 DIAGNOSIS — E2839 Other primary ovarian failure: Secondary | ICD-10-CM

## 2020-02-10 DIAGNOSIS — Z0000 Encounter for general adult medical examination without abnormal findings: Secondary | ICD-10-CM

## 2020-02-11 ENCOUNTER — Ambulatory Visit: Payer: 59 | Admitting: Family Medicine

## 2020-02-12 ENCOUNTER — Ambulatory Visit: Payer: 59 | Admitting: Family Medicine

## 2020-02-12 ENCOUNTER — Other Ambulatory Visit: Payer: Self-pay | Admitting: *Deleted

## 2020-02-12 DIAGNOSIS — F17210 Nicotine dependence, cigarettes, uncomplicated: Secondary | ICD-10-CM

## 2020-02-13 ENCOUNTER — Ambulatory Visit (HOSPITAL_COMMUNITY): Payer: 59

## 2020-02-20 ENCOUNTER — Encounter: Payer: 59 | Admitting: Gastroenterology

## 2020-02-20 ENCOUNTER — Telehealth: Payer: Self-pay | Admitting: Gastroenterology

## 2020-02-20 NOTE — Telephone Encounter (Signed)
Hey Dr Bryan Lemma, this pt cancelled her colonoscopy procedure 8:30am and will call back to reschedule. Pt states she called a week ago to inform that she was going to be out of town, pt is currently in [place] but the appt was never cancelled until now

## 2020-02-20 NOTE — Telephone Encounter (Signed)
Thank you :)

## 2020-02-25 ENCOUNTER — Telehealth: Payer: Self-pay | Admitting: Gastroenterology

## 2020-02-25 MED ORDER — NA SULFATE-K SULFATE-MG SULF 17.5-3.13-1.6 GM/177ML PO SOLN: 1.0000 | Freq: Once | ORAL | 0 refills | Status: AC

## 2020-02-25 NOTE — Telephone Encounter (Addendum)
Patient called stated she was not able to pick up the prep medication please resend script

## 2020-02-25 NOTE — Telephone Encounter (Signed)
Returned pts call with no answer.  LM on VM that I resent Rx for suprep to [facility] on [address].

## 2020-03-01 ENCOUNTER — Other Ambulatory Visit: Payer: Self-pay

## 2020-03-01 ENCOUNTER — Ambulatory Visit (HOSPITAL_COMMUNITY)
Admission: RE | Admit: 2020-03-01 | Discharge: 2020-03-01 | Disposition: A | Discharge status: Home / Self Care | Payer: 59 | Source: Ambulatory Visit | Attending: Family Medicine | Admitting: Family Medicine

## 2020-03-01 DIAGNOSIS — I10 Essential (primary) hypertension: Secondary | ICD-10-CM

## 2020-03-01 DIAGNOSIS — M79605 Pain in left leg: Secondary | ICD-10-CM

## 2020-03-01 DIAGNOSIS — M79604 Pain in right leg: Secondary | ICD-10-CM | POA: Insufficient documentation

## 2020-03-01 NOTE — Progress Notes (Signed)
ABI's have been completed. Preliminary results can be found in CV Proc through chart review.   03/01/20 10:00 AM Valerie Russell RVT

## 2020-03-08 ENCOUNTER — Other Ambulatory Visit: Payer: Self-pay

## 2020-03-08 ENCOUNTER — Ambulatory Visit (INDEPENDENT_AMBULATORY_CARE_PROVIDER_SITE_OTHER): Payer: 59 | Admitting: Family Medicine

## 2020-03-08 ENCOUNTER — Encounter: Payer: Self-pay | Admitting: Family Medicine

## 2020-03-08 VITALS — BP 138/82 | HR 115 | Temp 97.5°F | Ht 63.0 in | Wt 136.8 lb

## 2020-03-08 DIAGNOSIS — Z720 Tobacco use: Secondary | ICD-10-CM

## 2020-03-08 DIAGNOSIS — D509 Iron deficiency anemia, unspecified: Secondary | ICD-10-CM

## 2020-03-08 DIAGNOSIS — J449 Chronic obstructive pulmonary disease, unspecified: Secondary | ICD-10-CM | POA: Insufficient documentation

## 2020-03-08 DIAGNOSIS — E611 Iron deficiency: Secondary | ICD-10-CM

## 2020-03-08 DIAGNOSIS — E7800 Pure hypercholesterolemia, unspecified: Secondary | ICD-10-CM | POA: Diagnosis not present

## 2020-03-08 DIAGNOSIS — J439 Emphysema, unspecified: Secondary | ICD-10-CM

## 2020-03-08 DIAGNOSIS — K921 Melena: Secondary | ICD-10-CM

## 2020-03-08 NOTE — Patient Instructions (Signed)
Preventing High Cholesterol Cholesterol is a white, waxy substance similar to fat that the human body needs to help build cells. The liver makes all the cholesterol that a person's body needs. Having high cholesterol (hypercholesterolemia) increases a person's risk for heart disease and stroke. Extra (excess) cholesterol comes from the food the person eats. High cholesterol can often be prevented with diet and lifestyle changes. If you already have high cholesterol, you can control it with diet and lifestyle changes and with medicine. How can high cholesterol affect me? If you have high cholesterol, deposits (plaques) may build up on the walls of your arteries. The arteries are the blood vessels that carry blood away from your heart. Plaques make the arteries narrower and stiffer. This can limit or block blood flow and cause blood clots to form. Blood clots:  Are tiny balls of cells that form in your blood.  Can move to the heart or brain, causing a heart attack or stroke. Plaques in arteries greatly increase your risk for heart attack and stroke.Making diet and lifestyle changes can reduce your risk for these conditions that may threaten your life. What can increase my risk? This condition is more likely to develop in people who:  Eat foods that are high in saturated fat or cholesterol. Saturated fat is mostly found in: ? Foods that contain animal fat, such as red meat and some dairy products. ? Certain fatty foods made from plants, such as tropical oils.  Are overweight.  Are not getting enough exercise.  Have a family history of high cholesterol. What actions can I take to prevent this? Nutrition   Eat less saturated fat.  Avoid trans fats (partially hydrogenated oils). These are often found in margarine and in some baked goods, fried foods, and snacks bought in packages.  Avoid precooked or cured meat, such as sausages or meat loaves.  Avoid foods and drinks that have added  sugars.  Eat more fruits, vegetables, and whole grains.  Choose healthy sources of protein, such as fish, poultry, lean cuts of red meat, beans, peas, lentils, and nuts.  Choose healthy sources of fat, such as: ? Nuts. ? Vegetable oils, especially olive oil. ? Fish that have healthy fats (omega-3 fatty acids), such as mackerel or salmon. The items listed above may not be a complete list of recommended foods and beverages. Contact a dietitian for more information. Lifestyle  Lose weight if you are overweight. Losing 5-10 lb (2.3-4.5 kg) can help prevent or control high cholesterol. It can also lower your risk for diabetes and high blood pressure. Ask your health care provider to help you with a diet and exercise plan to lose weight safely.  Do not use any products that contain nicotine or tobacco, such as cigarettes, e-cigarettes, and chewing tobacco. If you need help quitting, ask your health care provider.  Limit your alcohol intake. ? Do not drink alcohol if:  Your health care provider tells you not to drink.  You are pregnant, may be pregnant, or are planning to become pregnant. ? If you drink alcohol:  Limit how much you use to:  0-1 drink a day for women.  0-2 drinks a day for men.  Be aware of how much alcohol is in your drink. In the [place] one drink equals one 12 oz bottle of beer (355 mL), one 5 oz glass of wine (148 mL), or one 1 oz glass of hard liquor (44 mL). Activity   Get enough exercise. Each week, do at  least 150 minutes of exercise that takes a medium level of effort (moderate-intensity exercise). ? This is exercise that:  Makes your heart beat faster and makes you breathe harder than usual.  Allows you to still be able to talk. ? You could exercise in short sessions several times a day or longer sessions a few times a week. For example, on 5 days each week, you could walk fast or ride your bike 3 times a day for 10 minutes each time.  Do exercises as told  by your health care provider. Medicines  In addition to diet and lifestyle changes, your health care provider may recommend medicines to help lower cholesterol. This may be a medicine to lower the amount of cholesterol your liver makes. You may need medicine if: ? Diet and lifestyle changes do not lower your cholesterol enough. ? You have high cholesterol and other risk factors for heart disease or stroke.  Take over-the-counter and prescription medicines only as told by your health care provider. General information  Manage your risk factors for high cholesterol. Talk with your health care provider about all your risk factors and how to lower your risk.  Manage other conditions that you have, such as diabetes or high blood pressure (hypertension).  Have blood tests to check your cholesterol levels at regular points in time as told by your health care provider.  Keep all follow-up visits as told by your health care provider. This is important. Where to find more information  American Heart Association: www.heart.org  National Heart, Lung, and Blood Institute: https://wilson-eaton.com/ Summary  High cholesterol increases your risk for heart disease and stroke. By keeping your cholesterol level low, you can reduce your risk for these conditions.  High cholesterol can often be prevented with diet and lifestyle changes.  Work with your health care provider to manage your risk factors, and have your blood tested regularly. This information is not intended to replace advice given to you by your health care provider. Make sure you discuss any questions you have with your health care provider. Document Revised: 11/08/2018 Document Reviewed: 03/25/2016 Elsevier Patient Education  [place].  Iron-Rich Diet  Iron is a mineral that helps your body to produce hemoglobin. Hemoglobin is a protein in red blood cells that carries oxygen to your body's tissues. Eating too little iron may cause you  to feel weak and tired, and it can increase your risk of infection. Iron is naturally found in many foods, and many foods have iron added to them (iron-fortified foods). You may need to follow an iron-rich diet if you do not have enough iron in your body due to certain medical conditions. The amount of iron that you need each day depends on your age, your sex, and any medical conditions you have. Follow instructions from your health care provider or a diet and nutrition specialist (dietitian) about how much iron you should eat each day. What are tips for following this plan? Reading food labels  Check food labels to see how many milligrams (mg) of iron are in each serving. Cooking  Cook foods in pots and pans that are made from iron.  Take these steps to make it easier for your body to absorb iron from certain foods: ? Soak beans overnight before cooking. ? Soak whole grains overnight and drain them before using. ? Ferment flours before baking, such as by using yeast in bread dough. Meal planning  When you eat foods that contain iron, you should eat  them with foods that are high in vitamin C. These include oranges, peppers, tomatoes, potatoes, and mango. Vitamin C helps your body to absorb iron. General information  Take iron supplements only as told by your health care provider. An overdose of iron can be life-threatening. If you were prescribed iron supplements, take them with orange juice or a vitamin C supplement.  When you eat iron-fortified foods or take an iron supplement, you should also eat foods that naturally contain iron, such as meat, poultry, and fish. Eating naturally iron-rich foods helps your body to absorb the iron that is added to other foods or contained in a supplement.  Certain foods and drinks prevent your body from absorbing iron properly. Avoid eating these foods in the same meal as iron-rich foods or with iron supplements. These foods include: ? Coffee, black tea,  and red wine. ? Milk, dairy products, and foods that are high in calcium. ? Beans and soybeans. ? Whole grains. What foods should I eat? Fruits Prunes. Raisins. Eat fruits high in vitamin C, such as oranges, grapefruits, and strawberries, alongside iron-rich foods. Vegetables Spinach (cooked). Green peas. Broccoli. Fermented vegetables. Eat vegetables high in vitamin C, such as leafy greens, potatoes, bell peppers, and tomatoes, alongside iron-rich foods. Grains Iron-fortified breakfast cereal. Iron-fortified whole-wheat bread. Enriched rice. Sprouted grains. Meats and other proteins Beef liver. Oysters. Beef. Shrimp. [place]. Chicken. [place]. Sardines. Chickpeas. Nuts. Tofu. Pumpkin seeds. Beverages Tomato juice. Fresh orange juice. Prune juice. Hibiscus tea. Fortified instant breakfast shakes. Sweets and desserts Blackstrap molasses. Seasonings and condiments Tahini. Fermented soy sauce. Other foods Wheat germ. The items listed above may not be a complete list of recommended foods and beverages. Contact a dietitian for more information. What foods should I avoid? Grains Whole grains. Bran cereal. Bran flour. Oats. Meats and other proteins Soybeans. Products made from soy protein. Black beans. Lentils. Mung beans. Split peas. Dairy Milk. Cream. Cheese. Yogurt. Cottage cheese. Beverages Coffee. Black tea. Red wine. Sweets and desserts Cocoa. Chocolate. Ice cream. Other foods Basil. Oregano. Large amounts of parsley. The items listed above may not be a complete list of foods and beverages to avoid. Contact a dietitian for more information. Summary  Iron is a mineral that helps your body to produce hemoglobin. Hemoglobin is a protein in red blood cells that carries oxygen to your body's tissues.  Iron is naturally found in many foods, and many foods have iron added to them (iron-fortified foods).  When you eat foods that contain iron, you should eat them with foods that are  high in vitamin C. Vitamin C helps your body to absorb iron.  Certain foods and drinks prevent your body from absorbing iron properly, such as whole grains and dairy products. You should avoid eating these foods in the same meal as iron-rich foods or with iron supplements. This information is not intended to replace advice given to you by your health care provider. Make sure you discuss any questions you have with your health care provider. Document Revised: 06/29/2017 Document Reviewed: 06/12/2017 Elsevier Patient Education  2020 [employer].

## 2020-03-08 NOTE — Addendum Note (Signed)
Addended by: Lynnea Ferrier on: 03/08/2020 01:01 PM   Modules accepted: Orders

## 2020-03-08 NOTE — Progress Notes (Signed)
Established Patient Office Visit  Subjective:  Patient ID: Valerie Russell, female    DOB: 01/14/1961  Age: 59 y.o. MRN: [id number]  CC:  Chief Complaint  Patient presents with  . Follow-up    follow up on BP and labs, pt fasting.     HPI [name] presents for follow-up of her blood pressure.  Was unable to tolerate the Zestril and chlorthalidone.  Blood pressures dropped down to 75 systolically.  She discontinued the medicines.  Blood pressures at home have been running in the 120s to 130s over 80s.  Did not tolerate Wellbutrin.  It did make her cigarettes taste bad but everything else is well tasted bad as well.  Was unable to tolerate prescribed iron.  She has been taking an iron pill from over-the-counter that she is tolerating.  She is consuming an iron rich diet.  Enjoys eating liver, prune juice and grains.  Admits to seeing some blood in her stool.  She has an appointment with gastroneurology on the 19th to discuss colonoscopy.  Unable to afford [facility] pulmonary doc prescribed.  She will follow-up for an alternative medicine with him.  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  Bone density is pending.  Fortunately ABIs were normal.  Past Medical History:  Diagnosis Date  . Allergy    pollen  . Cervical cancer ([place])    in her 39s pt states, laser surgery to remove  . COPD (chronic obstructive pulmonary disease) ([place])   . Diverticulitis   . Hypertension   . Seasonal allergies     Past Surgical History:  Procedure Laterality Date  . APPENDECTOMY    . CESAREAN SECTION  05/04/1997  . LASER ABLATION CONDYLOMA CERVICAL / VULVAR      Family History  Problem Relation Age of Onset  . Cancer Other   . Hypertension Mother   . COPD Mother   . Stroke Mother   . Colon cancer Neg Hx   . Colon polyps Neg Hx   . Esophageal cancer Neg Hx   . Rectal cancer Neg Hx   . Stomach cancer Neg Hx     Social History   Socioeconomic History  . Marital status: Single    Spouse  name: Not on file  . Number of children: Not on file  . Years of education: Not on file  . Highest education level: Not on file  Occupational History  . Not on file  Tobacco Use  . Smoking status: Current Every Day Smoker    Packs/day: 1.00    Years: 25.00    Pack years: 25.00    Types: Cigarettes  . Smokeless tobacco: Never Used  . Tobacco comment: 1/2 pack per day  Vaping Use  . Vaping Use: Never used  Substance and Sexual Activity  . Alcohol use: Yes    Alcohol/week: 1.0 standard drink    Types: 1 Glasses of wine per week    Comment: occasionally  . Drug use: Yes    Types: Marijuana    Comment: sometimes  . Sexual activity: Yes    Birth control/protection: None  Other Topics Concern  . Not on file  Social History Narrative  . Not on file   Social Determinants of Health   Financial Resource Strain:   . Difficulty of Paying Living Expenses:   Food Insecurity:   . Worried About Charity fundraiser in the Last Year:   . [place] in the Last Year:  Transportation Needs:   . Film/video editor (Medical):   Marland Kitchen Lack of Transportation (Non-Medical):   Physical Activity:   . Days of Exercise per Week:   . Minutes of Exercise per Session:   Stress:   . Feeling of Stress :   Social Connections:   . Frequency of Communication with Friends and Family:   . Frequency of Social Gatherings with Friends and Family:   . Attends Religious Services:   . Active Member of Clubs or Organizations:   . Attends Archivist Meetings:   Marland Kitchen Marital Status:   Intimate Partner Violence:   . Fear of Current or Ex-Partner:   . Emotionally Abused:   Marland Kitchen Physically Abused:   . Sexually Abused:     Outpatient Medications Prior to Visit  Medication Sig Dispense Refill  . albuterol (VENTOLIN HFA) 108 (90 Base) MCG/ACT inhaler Inhale 1-2 puffs into the lungs every 6 (six) hours as needed for wheezing or shortness of breath. 18 g 0  . cetirizine (ZYRTEC) 10 MG tablet Take 10  mg by mouth daily.    . folic acid (FOLVITE) 1 MG tablet Take 1 mg by mouth daily.    . Tiotropium Bromide Monohydrate (SPIRIVA RESPIMAT) 2.5 MCG/ACT AERS Inhale 2 puffs into the lungs daily. 4 g 0  . buPROPion (WELLBUTRIN) 75 MG tablet Take one each morning for a week. Then, start a second pill mid afternoon. (Patient not taking: Reported on 03/08/2020) 60 tablet 1  . chlorthalidone (HYGROTON) 25 MG tablet Take 1 tablet (25 mg total) by mouth daily. (Patient not taking: Reported on 03/08/2020) 90 tablet 1  . Iron, Ferrous Gluconate, 256 (28 Fe) MG TABS Take one tablet daily. (Patient not taking: Reported on 03/08/2020) 90 tablet 2  . lisinopril (ZESTRIL) 20 MG tablet Take 1 tablet (20 mg total) by mouth daily. (Patient not taking: Reported on 03/08/2020) 90 tablet 0  . Tiotropium Bromide Monohydrate (SPIRIVA RESPIMAT) 2.5 MCG/ACT AERS Inhale 2 puffs into the lungs daily. (Patient not taking: Reported on 03/08/2020) 4 g 3  . doxycycline (VIBRA-TABS) 100 MG tablet Take 1 tablet (100 mg total) by mouth 2 (two) times daily. (Patient not taking: Reported on 03/08/2020) 20 tablet 0  . pseudoephedrine (SUDAFED 12 HOUR) 120 MG 12 hr tablet Take 1 tablet (120 mg total) by mouth 2 (two) times daily. Use twice daily for five days (Patient not taking: Reported on 03/08/2020) 10 tablet 0   No facility-administered medications prior to visit.    No Known Allergies  ROS Review of Systems    Objective:    Physical Exam  BP 138/82   Pulse (!) 115   Temp (!) 97.5 F (36.4 C) (Tympanic)   Ht 5\' 3"  (1.6 m)   Wt 136 lb 12.8 oz (62.1 kg)   SpO2 96%   BMI 24.23 kg/m  Wt Readings from Last 3 Encounters:  03/08/20 136 lb 12.8 oz (62.1 kg)  02/05/20 137 lb (62.1 kg)  02/04/20 136 lb 6.4 oz (61.9 kg)     Health Maintenance Due  Topic Date Due  . Hepatitis C Screening  Never done  . HIV Screening  Never done  . PAP SMEAR-Modifier  Never done  . MAMMOGRAM  Never done  . COLONOSCOPY  Never done  . INFLUENZA  VACCINE  02/29/2020    There are no preventive care reminders to display for this patient.  No results found for: TSH Lab Results  Component Value Date   WBC 7.2  12/23/2019   HGB 9.9 (L) 12/23/2019   HCT 31.8 (L) 12/23/2019   MCV 76.7 (L) 12/23/2019   PLT 377.0 12/23/2019   Lab Results  Component Value Date   NA 140 12/23/2019   K 4.7 12/23/2019   CO2 26 12/23/2019   GLUCOSE 107 (H) 12/23/2019   BUN 12 12/23/2019   CREATININE 0.85 12/23/2019   BILITOT 0.3 12/23/2019   ALKPHOS 121 (H) 12/23/2019   AST 19 12/23/2019   ALT 17 12/23/2019   PROT 7.4 12/23/2019   ALBUMIN 4.4 12/23/2019   CALCIUM 9.5 12/23/2019   ANIONGAP 12 08/23/2015   GFR 68.33 12/23/2019   Lab Results  Component Value Date   CHOL 222 (H) 12/23/2019   Lab Results  Component Value Date   HDL 58.40 12/23/2019   Lab Results  Component Value Date   LDLCALC 142 (H) 12/23/2019   Lab Results  Component Value Date   TRIG 104.0 12/23/2019   Lab Results  Component Value Date   CHOLHDL 4 12/23/2019   No results found for: HGBA1C    Assessment & Plan:   Problem List Items Addressed This Visit      Respiratory   Pulmonary emphysema ([place])     Other   Microcytic anemia - Primary   Relevant Orders   CBC   Tobacco abuse   Elevated LDL cholesterol level   Relevant Orders   Basic metabolic panel   Lipid panel   Iron deficiency   Hematochezia      No orders of the defined types were placed in this encounter.   Follow-up: Return in about 3 months (around 06/08/2020).   Encourage smoking cessation.  Follow-up with pulmonology to find affordable meds.  Take as much iron as tolerated.  Follow-up with gastroenterology for colonoscopy.  Rechecking lipids today.  Continue checking blood pressure.  Hold BP meds for now. Libby Maw, MD

## 2020-03-10 ENCOUNTER — Telehealth: Payer: Self-pay | Admitting: Acute Care

## 2020-03-10 ENCOUNTER — Encounter: Payer: 59 | Admitting: Acute Care

## 2020-03-10 ENCOUNTER — Inpatient Hospital Stay: Admission: RE | Admit: 2020-03-10 | Payer: 59 | Source: Ambulatory Visit

## 2020-03-11 NOTE — Telephone Encounter (Signed)
Spoke with pt. She would like to reschedule lung screening in October. I advised pt that October schedule is not open yet and I will call her closer to October to get her scheduled. Pt verbalized understanding. Will close this message and refer to referral notes.

## 2020-03-11 NOTE — Telephone Encounter (Signed)
LMTC x 1 to reschedule lung screening and CT

## 2020-03-18 ENCOUNTER — Other Ambulatory Visit: Payer: Self-pay

## 2020-03-18 ENCOUNTER — Other Ambulatory Visit: Payer: Self-pay | Admitting: Gastroenterology

## 2020-03-18 ENCOUNTER — Encounter: Payer: Self-pay | Admitting: Gastroenterology

## 2020-03-18 ENCOUNTER — Ambulatory Visit (AMBULATORY_SURGERY_CENTER): Payer: 59 | Admitting: Gastroenterology

## 2020-03-18 VITALS — BP 170/100 | HR 94 | Temp 97.8°F | Resp 18 | Ht 63.0 in | Wt 137.0 lb

## 2020-03-18 DIAGNOSIS — K621 Rectal polyp: Secondary | ICD-10-CM

## 2020-03-18 DIAGNOSIS — K641 Second degree hemorrhoids: Secondary | ICD-10-CM

## 2020-03-18 DIAGNOSIS — D122 Benign neoplasm of ascending colon: Secondary | ICD-10-CM

## 2020-03-18 DIAGNOSIS — Z1211 Encounter for screening for malignant neoplasm of colon: Secondary | ICD-10-CM

## 2020-03-18 DIAGNOSIS — D509 Iron deficiency anemia, unspecified: Secondary | ICD-10-CM

## 2020-03-18 DIAGNOSIS — Z86010 Personal history of colonic polyps: Secondary | ICD-10-CM

## 2020-03-18 DIAGNOSIS — K5730 Diverticulosis of large intestine without perforation or abscess without bleeding: Secondary | ICD-10-CM

## 2020-03-18 DIAGNOSIS — K5520 Angiodysplasia of colon without hemorrhage: Secondary | ICD-10-CM

## 2020-03-18 DIAGNOSIS — D125 Benign neoplasm of sigmoid colon: Secondary | ICD-10-CM | POA: Diagnosis not present

## 2020-03-18 DIAGNOSIS — D124 Benign neoplasm of descending colon: Secondary | ICD-10-CM | POA: Diagnosis not present

## 2020-03-18 MED ORDER — SODIUM CHLORIDE 0.9 % IV SOLN: 500.0000 mL | Freq: Once | INTRAVENOUS | Status: DC

## 2020-03-18 NOTE — Patient Instructions (Signed)
Handouts given for polyps, hemorrhoids, high fiber diet and diverticulosis.  YOU HAD AN ENDOSCOPIC PROCEDURE TODAY AT [facility]:   Refer to the procedure report that was given to you for any specific questions about what was found during the examination.  If the procedure report does not answer your questions, please call your gastroenterologist to clarify.  If you requested that your care partner not be given the details of your procedure findings, then the procedure report has been included in a sealed envelope for you to review at your convenience later.  YOU SHOULD EXPECT: Some feelings of bloating in the abdomen. Passage of more gas than usual.  Walking can help get rid of the air that was put into your GI tract during the procedure and reduce the bloating. If you had a lower endoscopy (such as a colonoscopy or flexible sigmoidoscopy) you may notice spotting of blood in your stool or on the toilet paper. If you underwent a bowel prep for your procedure, you may not have a normal bowel movement for a few days.  Please Note:  You might notice some irritation and congestion in your nose or some drainage.  This is from the oxygen used during your procedure.  There is no need for concern and it should clear up in a day or so.  SYMPTOMS TO REPORT IMMEDIATELY:   Following lower endoscopy (colonoscopy or flexible sigmoidoscopy):  Excessive amounts of blood in the stool  Significant tenderness or worsening of abdominal pains  Swelling of the abdomen that is new, acute  Fever of 100F or higher  For urgent or emergent issues, a gastroenterologist can be reached at any hour by calling [phone]. Do not use MyChart messaging for urgent concerns.    DIET:  We do recommend a small meal at first, but then you may proceed to your regular diet.  Drink plenty of fluids but you should avoid alcoholic beverages for 24 hours.  ACTIVITY:  You should plan to take it easy for the rest of  today and you should NOT DRIVE or use heavy machinery until tomorrow (because of the sedation medicines used during the test).    FOLLOW UP: Our staff will call the number listed on your records 48-72 hours following your procedure to check on you and address any questions or concerns that you may have regarding the information given to you following your procedure. If we do not reach you, we will leave a message.  We will attempt to reach you two times.  During this call, we will ask if you have developed any symptoms of COVID 19. If you develop any symptoms (ie: fever, flu-like symptoms, shortness of breath, cough etc.) before then, please call [phone].  If you test positive for Covid 19 in the 2 weeks post procedure, please call and report this information to [place].    If any biopsies were taken you will be contacted by phone or by letter within the next 1-3 weeks.  Please call us at [phone] if you have not heard about the biopsies in 3 weeks.    SIGNATURES/CONFIDENTIALITY: You and/or your care partner have signed paperwork which will be entered into your electronic medical record.  These signatures attest to the fact that that the information above on your After Visit Summary has been reviewed and is understood.  Full responsibility of the confidentiality of this discharge information lies with you and/or your care-partner.

## 2020-03-18 NOTE — Op Note (Signed)
[facility] Patient Name: Valerie Russell Procedure Date: 03/18/2020 9:52 AM MRN: [id number] Endoscopist: Gerrit Heck , MD Age: 59 Referring MD:  Date of Birth: 17-Jul-1961 Gender: Female Account #: [id number] Procedure:                Colonoscopy Indications:              Screening for colorectal malignant neoplasm. This                            is the patient's first colonoscopy                           Additionally, she was recently diagnosed with iron                            deficiency anemia, with Hgb/Hct 9.9/31.8 from                            baseline Hgb ~14 four years ago. Ferritin 7, iron                            32, iron sat 7%, TIBC 446. Was started on PO iron                            (held for last 5 days). Medicines:                Monitored Anesthesia Care Procedure:                Pre-Anesthesia Assessment:                           - Prior to the procedure, a History and Physical                            was performed, and patient medications and                            allergies were reviewed. The patient's tolerance of                            previous anesthesia was also reviewed. The risks                            and benefits of the procedure and the sedation                            options and risks were discussed with the patient.                            All questions were answered, and informed consent                            was obtained. Prior Anticoagulants: The patient has  taken no previous anticoagulant or antiplatelet                            agents. ASA Grade Assessment: II - A patient with                            mild systemic disease. After reviewing the risks                            and benefits, the patient was deemed in                            satisfactory condition to undergo the procedure.                           After obtaining informed consent, the colonoscope                             was passed under direct vision. Throughout the                            procedure, the patient's blood pressure, pulse, and                            oxygen saturations were monitored continuously. The                            Colonoscope was introduced through the anus and                            advanced to the the terminal ileum. The colonoscopy                            was performed without difficulty. The patient                            tolerated the procedure well. The quality of the                            bowel preparation was good. The terminal ileum,                            ileocecal valve, appendiceal orifice, and rectum                            were photographed. Scope In: 10:02:09 AM Scope Out: 10:28:58 AM Scope Withdrawal Time: 0 hours 24 minutes 9 seconds  Total Procedure Duration: 0 hours 26 minutes 49 seconds  Findings:                 The perianal and digital rectal examinations were                            normal.  Three sessile polyps were found in the descending                            colon (2) and ascending colon (1). The polyps were                            3 to 6 mm in size. These polyps were removed with a                            cold snare. Resection and retrieval were complete.                            Estimated blood loss was minimal.                           A 8 mm polyp was found in the sigmoid colon. The                            polyp was semi-pedunculated. The polyp was removed                            with a hot snare. Resection and retrieval were                            complete. Estimated blood loss: none.                           Six sessile polyps were found in the sigmoid colon.                            The polyps were 2 to 5 mm in size. These polyps                            were removed with a cold snare. Resection and                            retrieval  were complete. Estimated blood loss was                            minimal.                           A 25 mm polyp was found in the proximal rectum. The                            polyp was sessile and crossed over a mucosal fold.                            Based on endoscopic appearance. No attempt at                            resection or biopsy made today in favor of  scheduling Endoscopic Mucosal Resection (EMR) at                            the hospital.                           Multiple small and large-mouthed diverticula were                            found in the sigmoid colon.                           Non-bleeding internal hemorrhoids were found during                            retroflexion. The hemorrhoids were small.                           A single small angioectasia without bleeding was                            found in the cecum.                           The terminal ileum appeared normal. Complications:            No immediate complications. Estimated Blood Loss:     Estimated blood loss was minimal. Impression:               - Three 3 to 6 mm polyps in the descending colon                            and in the ascending colon, removed with a cold                            snare. Resected and retrieved.                           - One 8 mm polyp in the sigmoid colon, removed with                            a hot snare. Resected and retrieved.                           - Six 2 to 5 mm polyps in the sigmoid colon,                            removed with a cold snare. Resected and retrieved.                           - One 25 mm polyp in the proximal rectum. Will plan                            for short interval repeat colonoscopy for  Endoscopic Mucosal Resection (EMR) at the hospital.                           - Diverticulosis in the sigmoid colon.                           - Non-bleeding internal  hemorrhoids.                           - A single non-bleeding colonic angioectasia. Will                            plan for endoscopic ablation at time of repeat                            colonoscopy at the hospital.                           - The examined portion of the ileum was normal. Recommendation:           - Patient has a contact number available for                            emergencies. The signs and symptoms of potential                            delayed complications were discussed with the                            patient. Return to normal activities tomorrow.                            Written discharge instructions were provided to the                            patient.                           - Resume previous diet.                           - Continue present medications.                           - Await pathology results.                           - Plan for upper GI endoscopy for further                            evaluation of recently diagnosed iron deficiency                            anemia and upper GI symptoms. This can be scheduled  to be done at the time of repeat colonoscopy.                           - Repeat colonoscopy and schedule EGD at the next                            available appointment at [facility].                           - Return to GI clinic at appointment to be                            scheduled. Gerrit Heck, MD 03/18/2020 10:42:27 AM

## 2020-03-18 NOTE — Progress Notes (Signed)
Called to room to assist during endoscopic procedure.  Patient ID and intended procedure confirmed with present staff. Received instructions for my participation in the procedure from the performing physician.  

## 2020-03-18 NOTE — Progress Notes (Signed)
Pt's states no medical or surgical changes since previsit or office visit.  Vitals- Loma Sousa

## 2020-03-18 NOTE — Progress Notes (Signed)
A and O x3. Report to RN. Tolerated MAC anesthesia well.

## 2020-03-22 ENCOUNTER — Encounter: Payer: Self-pay | Admitting: Gastroenterology

## 2020-03-22 ENCOUNTER — Telehealth: Payer: Self-pay | Admitting: *Deleted

## 2020-03-22 NOTE — Telephone Encounter (Signed)
Attempted 2nd f/u phone call. No answer. Left message.  °

## 2020-03-22 NOTE — Telephone Encounter (Signed)
Attempted f/u phone call. No answer. No option to leave message.  

## 2020-03-24 ENCOUNTER — Other Ambulatory Visit: Payer: Self-pay

## 2020-03-24 ENCOUNTER — Ambulatory Visit (INDEPENDENT_AMBULATORY_CARE_PROVIDER_SITE_OTHER): Payer: 59 | Admitting: Pulmonary Disease

## 2020-03-24 ENCOUNTER — Encounter: Payer: Self-pay | Admitting: Pulmonary Disease

## 2020-03-24 VITALS — BP 146/78 | HR 106 | Temp 98.0°F | Ht 63.0 in | Wt 139.0 lb

## 2020-03-24 DIAGNOSIS — R0600 Dyspnea, unspecified: Secondary | ICD-10-CM

## 2020-03-24 DIAGNOSIS — J449 Chronic obstructive pulmonary disease, unspecified: Secondary | ICD-10-CM

## 2020-03-24 LAB — PULMONARY FUNCTION TEST
DL/VA % pred: 68 %
DL/VA: 2.93 ml/min/mmHg/L
DLCO cor % pred: 70 %
DLCO cor: 13.88 ml/min/mmHg
DLCO unc % pred: 70 %
DLCO unc: 13.88 ml/min/mmHg
FEF 25-75 Post: 1.1 L/sec
FEF 25-75 Pre: 1.1 L/sec
FEF2575-%Change-Post: 0 %
FEF2575-%Pred-Post: 46 %
FEF2575-%Pred-Pre: 47 %
FEV1-%Change-Post: -1 %
FEV1-%Pred-Post: 73 %
FEV1-%Pred-Pre: 74 %
FEV1-Post: 1.83 L
FEV1-Pre: 1.85 L
FEV1FVC-%Change-Post: 0 %
FEV1FVC-%Pred-Pre: 85 %
FEV6-%Change-Post: -2 %
FEV6-%Pred-Post: 87 %
FEV6-%Pred-Pre: 89 %
FEV6-Post: 2.71 L
FEV6-Pre: 2.77 L
FEV6FVC-%Change-Post: 0 %
FEV6FVC-%Pred-Post: 103 %
FEV6FVC-%Pred-Pre: 103 %
FVC-%Change-Post: -2 %
FVC-%Pred-Post: 84 %
FVC-%Pred-Pre: 86 %
FVC-Post: 2.72 L
FVC-Pre: 2.78 L
Post FEV1/FVC ratio: 67 %
Post FEV6/FVC ratio: 100 %
Pre FEV1/FVC ratio: 67 %
Pre FEV6/FVC Ratio: 100 %
RV % pred: 107 %
RV: 2.06 L
TLC % pred: 100 %
TLC: 4.94 L

## 2020-03-24 NOTE — Patient Instructions (Addendum)
Mild obstructive lung disease on your breathing study  Continue breathing treatments Continue use of albuterol as needed   Continue working on smoking cessation  I will see you back in 6 months

## 2020-03-24 NOTE — Progress Notes (Signed)
Valerie Russell    [id number]    [birth year]  Primary Care Physician:Kremer, Mortimer Fries, MD  Referring Physician: Libby Maw, MD [place],  [place] [address]  Chief complaint:   Shortness of breath  HPI:  Shortness of breath which is progressive Did have Covid in February 2020 Just never really recovered breathing feels a little bit better  She continues to work on smoking cessation has not been able to quit yet  Denies any chest pains or chest discomfort No weight loss  Pack-a-day smoker 40-pack-year smoking history  Did not have a history of asthma growing up Did have recurrent seasonal allergies  No pertinent occupational history  Outpatient Encounter Medications as of 03/24/2020  Medication Sig  . albuterol (VENTOLIN HFA) 108 (90 Base) MCG/ACT inhaler Inhale 1-2 puffs into the lungs every 6 (six) hours as needed for wheezing or shortness of breath.  Marland Kitchen buPROPion (WELLBUTRIN) 75 MG tablet Take one each morning for a week. Then, start a second pill mid afternoon.  . cetirizine (ZYRTEC) 10 MG tablet Take 10 mg by mouth daily.  . chlorthalidone (HYGROTON) 25 MG tablet Take 1 tablet (25 mg total) by mouth daily.  . folic acid (FOLVITE) 1 MG tablet Take 1 mg by mouth daily.  . Iron, Ferrous Gluconate, 256 (28 Fe) MG TABS Take one tablet daily.  Marland Kitchen lisinopril (ZESTRIL) 20 MG tablet Take 1 tablet (20 mg total) by mouth daily.  . Tiotropium Bromide Monohydrate (SPIRIVA RESPIMAT) 2.5 MCG/ACT AERS Inhale 2 puffs into the lungs daily.  . Tiotropium Bromide Monohydrate (SPIRIVA RESPIMAT) 2.5 MCG/ACT AERS Inhale 2 puffs into the lungs daily.   No facility-administered encounter medications on file as of 03/24/2020.    Allergies as of 03/24/2020  . (No Known Allergies)    Past Medical History:  Diagnosis Date  . Allergy    pollen  . Cervical cancer ([place])    in her 51s pt states, laser surgery to remove  . COPD (chronic  obstructive pulmonary disease) ([place])   . Diverticulitis   . Hypertension   . Seasonal allergies     Past Surgical History:  Procedure Laterality Date  . APPENDECTOMY    . CESAREAN SECTION  05/04/1997  . LASER ABLATION CONDYLOMA CERVICAL / VULVAR      Family History  Problem Relation Age of Onset  . Cancer Other   . Hypertension Mother   . COPD Mother   . Stroke Mother   . Colon cancer Neg Hx   . Colon polyps Neg Hx   . Esophageal cancer Neg Hx   . Rectal cancer Neg Hx   . Stomach cancer Neg Hx     Social History   Socioeconomic History  . Marital status: Single    Spouse name: Not on file  . Number of children: Not on file  . Years of education: Not on file  . Highest education level: Not on file  Occupational History  . Not on file  Tobacco Use  . Smoking status: Current Every Day Smoker    Packs/day: 1.00    Years: 25.00    Pack years: 25.00    Types: Cigarettes  . Smokeless tobacco: Never Used  . Tobacco comment: 1/2 pack per day  Vaping Use  . Vaping Use: Never used  Substance and Sexual Activity  . Alcohol use: Yes    Alcohol/week: 1.0 standard drink    Types: 1 Glasses of wine  per week    Comment: occasionally  . Drug use: Yes    Types: Marijuana    Comment: sometimes  . Sexual activity: Yes    Birth control/protection: None  Other Topics Concern  . Not on file  Social History Narrative  . Not on file   Social Determinants of Health   Financial Resource Strain:   . Difficulty of Paying Living Expenses: Not on file  Food Insecurity:   . Worried About Charity fundraiser in the Last Year: Not on file  . Ran Out of Food in the Last Year: Not on file  Transportation Needs:   . Lack of Transportation (Medical): Not on file  . Lack of Transportation (Non-Medical): Not on file  Physical Activity:   . Days of Exercise per Week: Not on file  . Minutes of Exercise per Session: Not on file  Stress:   . Feeling of Stress : Not on file  Social  Connections:   . Frequency of Communication with Friends and Family: Not on file  . Frequency of Social Gatherings with Friends and Family: Not on file  . Attends Religious Services: Not on file  . Active Member of Clubs or Organizations: Not on file  . Attends Archivist Meetings: Not on file  . Marital Status: Not on file  Intimate Partner Violence:   . Fear of Current or Ex-Partner: Not on file  . Emotionally Abused: Not on file  . Physically Abused: Not on file  . Sexually Abused: Not on file    Review of Systems  Constitutional: Negative.  Negative for fatigue.  Respiratory: Positive for cough and shortness of breath.   Musculoskeletal: Negative.     Vitals:   03/24/20 1116  BP: (!) 146/78  Pulse: (!) 106  Temp: 98 F (36.7 C)  SpO2: 99%     Physical Exam Constitutional:      Appearance: Normal appearance.  HENT:     Nose: No congestion.     Mouth/Throat:     Mouth: Mucous membranes are moist.     Pharynx: No oropharyngeal exudate.  Eyes:     General:        Right eye: No discharge.        Left eye: No discharge.     Pupils: Pupils are equal, round, and reactive to light.  Cardiovascular:     Rate and Rhythm: Normal rate and regular rhythm.     Pulses: Normal pulses.     Heart sounds: Normal heart sounds. No murmur heard.  No friction rub.  Pulmonary:     Effort: Pulmonary effort is normal. No respiratory distress.     Breath sounds: No stridor. No wheezing, rhonchi or rales.  Abdominal:     General: Abdomen is flat.  Musculoskeletal:     Cervical back: No rigidity or tenderness.  Skin:    General: Skin is warm.  Neurological:     General: No focal deficit present.     Mental Status: She is alert.  Psychiatric:        Mood and Affect: Mood normal.    Data Reviewed: Recent chest x-ray reviewed with the patient showing hyperinflated lung fields 01/28/2020  PFT reveals mild obstructive disease with no significant bronchodilator  response  Assessment:  Shortness of breath -Related to smoking history -Symptoms more pronounced since having Covid infection -PFT with mild obstructive disease  Active smoker -Pack-a-day smoker -she is working on quitting  COPD -Likely secondary to  emphysema  Plan/Recommendations: Low-dose CT screening  Continue albuterol as needed  Smoking cessation counseling  Follow-up in 6 months  Sherrilyn Rist MD  Pulmonary and Critical Care 03/24/2020, 11:34 AM  CC: Libby Maw,*

## 2020-03-24 NOTE — Progress Notes (Signed)
Full PFT performed today. °

## 2020-05-05 ENCOUNTER — Other Ambulatory Visit: Payer: Self-pay | Admitting: Gastroenterology

## 2020-05-05 ENCOUNTER — Telehealth: Payer: Self-pay | Admitting: Gastroenterology

## 2020-05-05 ENCOUNTER — Ambulatory Visit: Payer: 59 | Admitting: Gastroenterology

## 2020-05-05 MED ORDER — CLENPIQ 10-3.5-12 MG-GM -GM/160ML PO SOLN: 1.0000 | Freq: Once | ORAL | 0 refills | Status: AC

## 2020-05-05 NOTE — Telephone Encounter (Signed)
LMOM for patient to call back.

## 2020-05-05 NOTE — Telephone Encounter (Signed)
Spoke to patient to let her know that her colon prep instructions have been mailed to her. She cancelled her appointment for today. She will contact the office with any questions or concerns.

## 2020-05-05 NOTE — Telephone Encounter (Signed)
Patient would like to speak with nurse in reference to upcoming procedure

## 2020-05-05 NOTE — Telephone Encounter (Signed)
Pt returned call.  Wants to speak to nurse regarding procedure

## 2020-05-06 ENCOUNTER — Inpatient Hospital Stay (HOSPITAL_COMMUNITY): Admission: RE | Admit: 2020-05-06 | Payer: 59 | Source: Ambulatory Visit

## 2020-05-07 ENCOUNTER — Telehealth: Payer: Self-pay | Admitting: Gastroenterology

## 2020-05-07 ENCOUNTER — Ambulatory Visit: Payer: 59

## 2020-05-07 ENCOUNTER — Other Ambulatory Visit (HOSPITAL_COMMUNITY)
Admission: RE | Admit: 2020-05-07 | Discharge: 2020-05-07 | Disposition: A | Discharge status: Home / Self Care | Payer: 59 | Source: Ambulatory Visit | Attending: Gastroenterology | Admitting: Gastroenterology

## 2020-05-07 DIAGNOSIS — Z01812 Encounter for preprocedural laboratory examination: Secondary | ICD-10-CM | POA: Diagnosis present

## 2020-05-07 DIAGNOSIS — Z20822 Contact with and (suspected) exposure to covid-19: Secondary | ICD-10-CM | POA: Insufficient documentation

## 2020-05-07 LAB — SARS CORONAVIRUS 2 (TAT 6-24 HRS): SARS Coronavirus 2: NEGATIVE

## 2020-05-07 NOTE — Telephone Encounter (Signed)
Patient called requesting for her prep to be sent in

## 2020-05-10 ENCOUNTER — Other Ambulatory Visit: Payer: Self-pay

## 2020-05-10 ENCOUNTER — Encounter (HOSPITAL_COMMUNITY): Payer: Self-pay | Admitting: Gastroenterology

## 2020-05-10 ENCOUNTER — Ambulatory Visit (HOSPITAL_COMMUNITY)
Admission: RE | Admit: 2020-05-10 | Discharge: 2020-05-10 | Disposition: A | Discharge status: Home / Self Care | Payer: 59 | Attending: Gastroenterology | Admitting: Gastroenterology

## 2020-05-10 ENCOUNTER — Ambulatory Visit (HOSPITAL_COMMUNITY): Payer: 59 | Admitting: Certified Registered Nurse Anesthetist

## 2020-05-10 ENCOUNTER — Encounter (HOSPITAL_COMMUNITY)
Admission: RE | Disposition: A | Discharge status: Home / Self Care | Payer: Self-pay | Source: Home / Self Care | Attending: Gastroenterology

## 2020-05-10 DIAGNOSIS — D122 Benign neoplasm of ascending colon: Secondary | ICD-10-CM

## 2020-05-10 DIAGNOSIS — I10 Essential (primary) hypertension: Secondary | ICD-10-CM | POA: Insufficient documentation

## 2020-05-10 DIAGNOSIS — K621 Rectal polyp: Secondary | ICD-10-CM | POA: Diagnosis not present

## 2020-05-10 DIAGNOSIS — D509 Iron deficiency anemia, unspecified: Secondary | ICD-10-CM | POA: Diagnosis not present

## 2020-05-10 DIAGNOSIS — K5730 Diverticulosis of large intestine without perforation or abscess without bleeding: Secondary | ICD-10-CM | POA: Diagnosis not present

## 2020-05-10 DIAGNOSIS — K6289 Other specified diseases of anus and rectum: Secondary | ICD-10-CM | POA: Insufficient documentation

## 2020-05-10 DIAGNOSIS — Z8541 Personal history of malignant neoplasm of cervix uteri: Secondary | ICD-10-CM | POA: Insufficient documentation

## 2020-05-10 DIAGNOSIS — Z86010 Personal history of colonic polyps: Secondary | ICD-10-CM | POA: Diagnosis not present

## 2020-05-10 DIAGNOSIS — D125 Benign neoplasm of sigmoid colon: Secondary | ICD-10-CM

## 2020-05-10 DIAGNOSIS — Z79899 Other long term (current) drug therapy: Secondary | ICD-10-CM | POA: Diagnosis not present

## 2020-05-10 DIAGNOSIS — D128 Benign neoplasm of rectum: Secondary | ICD-10-CM | POA: Insufficient documentation

## 2020-05-10 DIAGNOSIS — Z8719 Personal history of other diseases of the digestive system: Secondary | ICD-10-CM

## 2020-05-10 DIAGNOSIS — K449 Diaphragmatic hernia without obstruction or gangrene: Secondary | ICD-10-CM | POA: Insufficient documentation

## 2020-05-10 DIAGNOSIS — J302 Other seasonal allergic rhinitis: Secondary | ICD-10-CM | POA: Insufficient documentation

## 2020-05-10 DIAGNOSIS — J449 Chronic obstructive pulmonary disease, unspecified: Secondary | ICD-10-CM | POA: Diagnosis not present

## 2020-05-10 DIAGNOSIS — D124 Benign neoplasm of descending colon: Secondary | ICD-10-CM

## 2020-05-10 DIAGNOSIS — F17200 Nicotine dependence, unspecified, uncomplicated: Secondary | ICD-10-CM | POA: Diagnosis not present

## 2020-05-10 HISTORY — PX: ESOPHAGOGASTRODUODENOSCOPY (EGD) WITH PROPOFOL: SHX5813

## 2020-05-10 HISTORY — PX: BIOPSY: SHX5522

## 2020-05-10 HISTORY — PX: SUBMUCOSAL LIFTING INJECTION: SHX6855

## 2020-05-10 HISTORY — PX: ENDOSCOPIC MUCOSAL RESECTION: SHX6839

## 2020-05-10 HISTORY — PX: COLONOSCOPY WITH PROPOFOL: SHX5780

## 2020-05-10 HISTORY — PX: HEMOSTASIS CLIP PLACEMENT: SHX6857

## 2020-05-10 SURGERY — COLONOSCOPY WITH PROPOFOL
Anesthesia: Monitor Anesthesia Care

## 2020-05-10 MED ORDER — PROPOFOL 10 MG/ML IV BOLUS
INTRAVENOUS | Status: DC | PRN
  Administered 2020-05-10: 30 mg via INTRAVENOUS

## 2020-05-10 MED ORDER — LIDOCAINE HCL (CARDIAC) PF 100 MG/5ML IV SOSY
PREFILLED_SYRINGE | INTRAVENOUS | Status: DC | PRN
  Administered 2020-05-10: 100 mg via INTRAVENOUS

## 2020-05-10 MED ORDER — PROPOFOL 500 MG/50ML IV EMUL
INTRAVENOUS | Status: DC | PRN
  Administered 2020-05-10: 125 ug/kg/min via INTRAVENOUS

## 2020-05-10 MED ORDER — SODIUM CHLORIDE 0.9 % IV SOLN: INTRAVENOUS | Status: DC

## 2020-05-10 MED ORDER — LACTATED RINGERS IV SOLN: INTRAVENOUS | Status: DC

## 2020-05-10 SURGICAL SUPPLY — 24 items

## 2020-05-10 NOTE — Interval H&P Note (Signed)
History and Physical Interval Note:  05/10/2020 9:26 AM  Valerie Russell  has presented today for surgery, with the diagnosis of Colon polyps IDA.  The various methods of treatment have been discussed with the patient and family. After consideration of risks, benefits and other options for treatment, the patient has consented to  Procedure(s) with comments: COLONOSCOPY WITH PROPOFOL (N/A) - colonoscopy with EMR ENDOSCOPIC MUCOSAL RESECTION (N/A) ESOPHAGOGASTRODUODENOSCOPY (EGD) WITH PROPOFOL (N/A) - EGD with possible dilation BALLOON DILATION (N/A) as a surgical intervention.  The patient's history has been reviewed, patient examined, no change in status, stable for surgery.  I have reviewed the patient's chart and labs.  Questions were answered to the patient's satisfaction.     Dominic Pea Hartlee Amedee

## 2020-05-10 NOTE — Discharge Instructions (Signed)

## 2020-05-10 NOTE — Op Note (Signed)
[facility] Patient Name: Valerie Russell Procedure Date: 05/10/2020 MRN: [id number] Attending MD: Gerrit Heck , MD Date of Birth: 10/20/1960 CSN: [id number] Age: 59 Admit Type: Outpatient Procedure:                Upper GI endoscopy Indications:              Iron deficiency anemia Providers:                Gerrit Heck, MD, Cleda Daub, RN, Laverda Sorenson, Technician, Stephanie [place] CRNA Referring MD:              Medicines:                Monitored Anesthesia Care Complications:            No immediate complications. Estimated Blood Loss:     Estimated blood loss was minimal. Procedure:                Pre-Anesthesia Assessment:                           - Prior to the procedure, a History and Physical                            was performed, and patient medications and                            allergies were reviewed. The patient's tolerance of                            previous anesthesia was also reviewed. The risks                            and benefits of the procedure and the sedation                            options and risks were discussed with the patient.                            All questions were answered, and informed consent                            was obtained. Prior Anticoagulants: The patient has                            taken no previous anticoagulant or antiplatelet                            agents. ASA Grade Assessment: II - A patient with                            mild systemic disease. After reviewing the risks  and benefits, the patient was deemed in                            satisfactory condition to undergo the procedure.                           After obtaining informed consent, the endoscope was                            passed under direct vision. Throughout the                            procedure, the patient's blood pressure, pulse, and                             oxygen saturations were monitored continuously. The                            GIF-H190 ([id number]) Olympus gastroscope was                            introduced through the mouth, and advanced to the                            second part of duodenum. The upper GI endoscopy was                            accomplished without difficulty. The patient                            tolerated the procedure well. Scope In: Scope Out: Findings:      A 3 cm hiatal hernia was present.      The exam of the esophagus was otherwise normal.      The entire examined stomach was normal. Biopsies were taken with a cold       forceps for Helicobacter pylori testing. Estimated blood loss was       minimal.      The duodenal bulb, first portion of the duodenum and second portion of       the duodenum were normal. Biopsies for histology were taken with a cold       forceps for evaluation of celiac disease. Estimated blood loss was       minimal. Impression:               - 3 cm hiatal hernia.                           - Normal stomach. Biopsied.                           - Normal duodenal bulb, first portion of the                            duodenum and second portion of the duodenum.  Biopsied. Moderate Sedation:      Not Applicable - Patient had care per Anesthesia. Recommendation:           - Perform a colonoscopy today.                           - If biopsies unrevealing, and if persistent iron                            deficiency anemia, will plan for Video Capsule                            Endoscopy for further small bowel interrogation. Procedure Code(s):        --- Professional ---                           [phone], Esophagogastroduodenoscopy, flexible,                            transoral; with biopsy, single or multiple Diagnosis Code(s):        --- Professional ---                           K44.9, Diaphragmatic hernia without obstruction or                             gangrene                           D50.9, Iron deficiency anemia, unspecified CPT copyright 2019 American Medical Association. All rights reserved. The codes documented in this report are preliminary and upon coder review may  be revised to meet current compliance requirements. Gerrit Heck, MD 05/10/2020 11:02:42 AM Number of Addenda: 0

## 2020-05-10 NOTE — Anesthesia Preprocedure Evaluation (Signed)
Anesthesia Evaluation  Patient identified by MRN, date of birth, ID band Patient awake    Reviewed: Allergy & Precautions, NPO status , Patient's Chart, lab work & pertinent test results  Airway Mallampati: II  TM Distance: >3 FB Neck ROM: Full    Dental  (+) Teeth Intact, Dental Advisory Given   Pulmonary COPD,  COPD inhaler, Current Smoker,    breath sounds clear to auscultation       Cardiovascular hypertension, Pt. on medications  Rhythm:Regular Rate:Normal     Neuro/Psych PSYCHIATRIC DISORDERS Depression    GI/Hepatic negative GI ROS, Neg liver ROS,   Endo/Other  negative endocrine ROS  Renal/GU negative Renal ROS     Musculoskeletal negative musculoskeletal ROS (+)   Abdominal Normal abdominal exam  (+)   Peds  Hematology negative hematology ROS (+)   Anesthesia Other Findings   Reproductive/Obstetrics                             Anesthesia Physical Anesthesia Plan  ASA: III  Anesthesia Plan: General   Post-op Pain Management:    Induction: Intravenous  PONV Risk Score and Plan: 0 and Propofol infusion  Airway Management Planned: Natural Airway and Simple Face Mask  Additional Equipment: None  Intra-op Plan:   Post-operative Plan:   Informed Consent: I have reviewed the patients History and Physical, chart, labs and discussed the procedure including the risks, benefits and alternatives for the proposed anesthesia with the patient or authorized representative who has indicated his/her understanding and acceptance.       Plan Discussed with: CRNA  Anesthesia Plan Comments:         Anesthesia Quick Evaluation

## 2020-05-10 NOTE — Transfer of Care (Signed)
Immediate Anesthesia Transfer of Care Note  Patient: Valerie Russell  Procedure(s) Performed: COLONOSCOPY WITH PROPOFOL (N/A ) ENDOSCOPIC MUCOSAL RESECTION (N/A ) ESOPHAGOGASTRODUODENOSCOPY (EGD) WITH PROPOFOL (N/A ) SUBMUCOSAL LIFTING INJECTION HEMOSTASIS CLIP PLACEMENT  Patient Location: PACU  Anesthesia Type:MAC  Level of Consciousness: sedated, patient cooperative and responds to stimulation  Airway & Oxygen Therapy: Patient Spontanous Breathing and Patient connected to face mask oxygen  Post-op Assessment: Report given to RN and Post -op Vital signs reviewed and stable  Post vital signs: Reviewed and stable  Last Vitals:  Vitals Value Taken Time  BP    Temp    Pulse 82 05/10/20 1105  Resp 13 05/10/20 1105  SpO2 100 % 05/10/20 1105  Vitals shown include unvalidated device data.  Last Pain:  Vitals:   05/10/20 1103  TempSrc: Oral  PainSc: 0-No pain         Complications: No complications documented.

## 2020-05-10 NOTE — H&P (Signed)
P  Chief Complaint:   Large rectal polyp requiring endoscopic mucosal resection   HPI:     Patient is a 59 y.o. female presenting to [facility] for resection of known large rectal polyp.  Underwent colonoscopy on 03/18/2020 for initial CRC screening.  Additionally, history notable for iron deficiency anemia.  Colonoscopy notable for 3 subcentimeter tubular adenomas in the ascending/descending colon, 8 mm tubular adenoma in the sigmoid, 6 sigmoid hyperplastic polyps, all resected with cold snare.  A 25 mm flat lesion in the proximal rectum was not sampled in favor of endoscopic mucosal resection (EMR) at the hospital.  Incidentally also noted sigmoid diverticulosis and a single nonbleeding cecal AVM.  No new labs since noted iron deficiency anemia in 12/2019.    Review of systems:     No chest pain, no SOB, no fevers, no urinary sx   Past Medical History:  Diagnosis Date  . Allergy    pollen  . Cervical cancer ([place])    in her 89s pt states, laser surgery to remove  . COPD (chronic obstructive pulmonary disease) ([place])   . Diverticulitis   . Hypertension   . Seasonal allergies     Patient's surgical history, family medical history, social history, medications and allergies were all reviewed in Epic    No current facility-administered medications for this encounter.   Current Outpatient Medications  Medication Sig Dispense Refill  . albuterol (VENTOLIN HFA) 108 (90 Base) MCG/ACT inhaler Inhale 1-2 puffs into the lungs every 6 (six) hours as needed for wheezing or shortness of breath. 18 g 0  . cetirizine (ZYRTEC) 10 MG tablet Take 10 mg by mouth daily.    . ferrous sulfate 325 (65 FE) MG tablet Take 162.5 mg by mouth daily with breakfast.    . folic acid (FOLVITE) 1 MG tablet Take 1 mg by mouth daily.    Marland Kitchen lisinopril (ZESTRIL) 20 MG tablet Take 1 tablet (20 mg total) by mouth daily. 90 tablet 0  . Tiotropium Bromide Monohydrate (SPIRIVA RESPIMAT) 2.5 MCG/ACT AERS  Inhale 2 puffs into the lungs daily. 4 g 0  . buPROPion (WELLBUTRIN) 75 MG tablet Take one each morning for a week. Then, start a second pill mid afternoon. (Patient not taking: Reported on 05/05/2020) 60 tablet 1  . chlorthalidone (HYGROTON) 25 MG tablet Take 1 tablet (25 mg total) by mouth daily. (Patient not taking: Reported on 05/05/2020) 90 tablet 1  . Iron, Ferrous Gluconate, 256 (28 Fe) MG TABS Take one tablet daily. (Patient not taking: Reported on 05/05/2020) 90 tablet 2  . Tiotropium Bromide Monohydrate (SPIRIVA RESPIMAT) 2.5 MCG/ACT AERS Inhale 2 puffs into the lungs daily. (Patient not taking: Reported on 05/05/2020) 4 g 3    Physical Exam:     There were no vitals taken for this visit.  GENERAL:  Pleasant female in NAD PSYCH: : Cooperative, normal affect EENT:  conjunctiva pink, mucous membranes moist, neck supple without masses CARDIAC:  RRR, no murmur heard, no peripheral edema PULM: Normal respiratory effort, lungs CTA bilaterally, no wheezing ABDOMEN:  Nondistended, soft, nontender. No obvious masses, no hepatomegaly,  normal bowel sounds SKIN:  turgor, no lesions seen Musculoskeletal:  Normal muscle tone, normal strength NEURO: Alert and oriented x 3, no focal neurologic deficits   IMPRESSION and PLAN:    1) History of colon polyps -Plan for colonoscopy today for Endoscopic Mucosal Resection (EMR) of known large flat rectal polyp  2) Iron deficiency anemia 3) Cecal AVM -  Plan for treatment of known cecal AVM if this is again noted on today's colonoscopy -EGD today          Lavena Bullion ,DO, FACG 05/10/2020, 7:35 AM

## 2020-05-10 NOTE — Op Note (Signed)
[facility] Patient Name: Valerie Russell Procedure Date: 05/10/2020 MRN: [id number] Attending MD: Gerrit Heck , MD Date of Birth: 07/16/1961 CSN: [id number] Age: 59 Admit Type: Outpatient Procedure:                Colonoscopy Indications:              For therapy of adenomatous polyps in the colon                           59 yo female with recent screening colonoscopy                            notable for large proximal rectal polyp, presents                            today for Endoscopic Mucosal Resection (EMR). That                            colonoscopy was also notable for 3 additional                            subcentimeter tubular adenomas and 6 small sigmoid                            hyperplastic polyps. Providers:                Gerrit Heck, MD, Cleda Daub, RN, Laverda Sorenson, Technician, Stephanie [place] CRNA Referring MD:              Medicines:                Monitored Anesthesia Care Complications:            No immediate complications. Estimated Blood Loss:     Estimated blood loss was minimal. Procedure:                Pre-Anesthesia Assessment:                           - Prior to the procedure, a History and Physical                            was performed, and patient medications and                            allergies were reviewed. The patient's tolerance of                            previous anesthesia was also reviewed. The risks                            and benefits of the procedure and the sedation                            options  and risks were discussed with the patient.                            All questions were answered, and informed consent                            was obtained. Prior Anticoagulants: The patient has                            taken no previous anticoagulant or antiplatelet                            agents. ASA Grade Assessment: II - A patient with                             mild systemic disease. After reviewing the risks                            and benefits, the patient was deemed in                            satisfactory condition to undergo the procedure.                           After obtaining informed consent, the colonoscope                            was passed under direct vision. Throughout the                            procedure, the patient's blood pressure, pulse, and                            oxygen saturations were monitored continuously. The                            CF-HQ190L ([id number] Olympus colonoscope was                            introduced through the anus and advanced to the the                            cecum, identified by appendiceal orifice and                            ileocecal valve. The colonoscopy was performed                            without difficulty. The patient tolerated the                            procedure well. The quality of the bowel  preparation was good. The ileocecal valve,                            appendiceal orifice, and rectum were photographed. Scope In: 10:05:01 AM Scope Out: 10:56:22 AM Scope Withdrawal Time: 0 hours 47 minutes 18 seconds  Total Procedure Duration: 0 hours 51 minutes 21 seconds  Findings:      The perianal and digital rectal examinations were normal.      A 30 mm polyp was found in the proximal rectum. The polyp was       semi-sessile. The polyp was removed with a injection-lift technique       using a Eleview followed by hot snare. Piecemeal resection and retrieval       were complete. The edges were then resected with cold forceps for       additional resection via avulsion technique. There was a small island of       adenomatous appearing mucosa in the center of the resection site that       was also resected via cod forceps. To close a defect after polypectomy,       four hemostatic clips were successfully placed. There was no  bleeding at       the end of the procedure.      Multiple small and large-mouthed diverticula were found in the sigmoid       colon. Impression:               - One 30 mm polyp in the proximal rectum, removed                            using injection-lift and a hot snare with cold                            forceps resection of the edges via avulsion                            technique. Resected and retrieved and clips were                            placed.                           - Diverticulosis in the sigmoid colon. Moderate Sedation:      Not Applicable - Patient had care per Anesthesia. Recommendation:           - Patient has a contact number available for                            emergencies. The signs and symptoms of potential                            delayed complications were discussed with the                            patient. Return to normal activities tomorrow.  Written discharge instructions were provided to the                            patient.                           - Resume previous diet.                           - Continue present medications.                           - Await pathology results.                           - Repeat colonoscopy in 6 months for surveillance                            after piecemeal polypectomy.                           - Return to GI clinic in 2 months.                           - Repeat CBC and iron panel in 2 weeks. Procedure Code(s):        --- Professional ---                           [phone], Colonoscopy, flexible; with removal of                            tumor(s), polyp(s), or other lesion(s) by snare                            technique                           45381, Colonoscopy, flexible; with directed                            submucosal injection(s), any substance Diagnosis Code(s):        --- Professional ---                           K62.1, Rectal polyp                            D12.6, Benign neoplasm of colon, unspecified                           K57.30, Diverticulosis of large intestine without                            perforation or abscess without bleeding CPT copyright 2019 American Medical Association. All rights reserved. The codes documented in this report are preliminary and upon coder review may  be revised to meet current compliance requirements. Gerrit Heck, MD 05/10/2020 11:13:42 AM  Number of Addenda: 0

## 2020-05-10 NOTE — Anesthesia Postprocedure Evaluation (Signed)
Anesthesia Post Note  Patient: Valerie Russell  Procedure(s) Performed: COLONOSCOPY WITH PROPOFOL (N/A ) ENDOSCOPIC MUCOSAL RESECTION (N/A ) ESOPHAGOGASTRODUODENOSCOPY (EGD) WITH PROPOFOL (N/A ) SUBMUCOSAL LIFTING INJECTION HEMOSTASIS CLIP PLACEMENT     Patient location during evaluation: PACU Anesthesia Type: MAC Level of consciousness: awake and alert Pain management: pain level controlled Vital Signs Assessment: post-procedure vital signs reviewed and stable Respiratory status: spontaneous breathing, nonlabored ventilation, respiratory function stable and patient connected to nasal cannula oxygen Cardiovascular status: stable and blood pressure returned to baseline Postop Assessment: no apparent nausea or vomiting Anesthetic complications: no   No complications documented.  Last Vitals:  Vitals:   05/10/20 1110 05/10/20 1120  BP: (!) 176/92 (!) 160/87  Pulse: 72 73  Resp: (!) 32 15  Temp:    SpO2: 100% 100%    Last Pain:  Vitals:   05/10/20 1120  TempSrc:   PainSc: 0-No pain                 Effie Berkshire

## 2020-05-11 ENCOUNTER — Other Ambulatory Visit: Payer: Self-pay

## 2020-05-11 LAB — SURGICAL PATHOLOGY

## 2020-05-12 ENCOUNTER — Encounter (HOSPITAL_COMMUNITY): Payer: Self-pay | Admitting: Gastroenterology

## 2020-05-17 ENCOUNTER — Encounter: Payer: Self-pay | Admitting: Gastroenterology

## 2020-05-29 ENCOUNTER — Ambulatory Visit: Payer: 59 | Attending: Internal Medicine

## 2020-05-29 DIAGNOSIS — Z23 Encounter for immunization: Secondary | ICD-10-CM

## 2020-05-29 NOTE — Progress Notes (Signed)
   Covid-19 Vaccination Clinic  Name:  Giulia Hickey    MRN: [id number] DOB: 1961/04/12  05/29/2020  Ms. Osterman was observed post Covid-19 immunization for 15 minutes without incident. She was provided with Vaccine Information Sheet and instruction to access the V-Safe system.   Ms. Hafner was instructed to call 911 with any severe reactions post vaccine: Marland Kitchen Difficulty breathing  . Swelling of face and throat  . A fast heartbeat  . A bad rash all over body  . Dizziness and weakness

## 2020-07-09 ENCOUNTER — Telehealth: Payer: Self-pay | Admitting: Family Medicine

## 2020-07-09 NOTE — Telephone Encounter (Signed)
I spoke with pt and she stated that she feels like she has a sinus infection.  Pt was asking for a antibiotic for it.  I informed pt that she would need a visit for evaluation.  Pt scheduled for Thursday but will call to see if there is a cancellation on Monday.

## 2020-07-09 NOTE — Telephone Encounter (Signed)
Pt of Dr Ethelene Hal, she called and requested call back from nurse to discuss problem, She wouldn't give me any info. Please advise

## 2020-07-14 ENCOUNTER — Ambulatory Visit: Payer: 59

## 2020-07-14 ENCOUNTER — Encounter: Payer: 59 | Admitting: Acute Care

## 2020-07-14 ENCOUNTER — Telehealth: Payer: Self-pay | Admitting: Acute Care

## 2020-07-15 ENCOUNTER — Encounter: Payer: Self-pay | Admitting: Family

## 2020-07-15 ENCOUNTER — Telehealth (INDEPENDENT_AMBULATORY_CARE_PROVIDER_SITE_OTHER): Payer: 59 | Admitting: Family

## 2020-07-15 VITALS — Temp 99.3°F | Ht 64.0 in | Wt 135.0 lb

## 2020-07-15 DIAGNOSIS — J019 Acute sinusitis, unspecified: Secondary | ICD-10-CM | POA: Diagnosis not present

## 2020-07-15 DIAGNOSIS — B9689 Other specified bacterial agents as the cause of diseases classified elsewhere: Secondary | ICD-10-CM

## 2020-07-15 MED ORDER — AMOXICILLIN 500 MG PO CAPS: 500.0000 mg | ORAL_CAPSULE | Freq: Two times a day (BID) | ORAL | 0 refills | Status: DC

## 2020-07-15 NOTE — Telephone Encounter (Signed)
ATC pt. Unable to leave message. Will call back.

## 2020-07-15 NOTE — Telephone Encounter (Signed)
Spoke with pt to reschedule lung screening. Pt was busy and asked that I call back.

## 2020-07-15 NOTE — Progress Notes (Signed)
Virtual Visit via Video   I connected with patient on 07/15/20 at  9:40 AM EST by a video enabled telemedicine application and verified that I am speaking with the correct person using two identifiers.  Location patient: Home Location provider: [employer], Office Persons participating in the virtual visit: Patient, Provider, CMA  I discussed the limitations of evaluation and management by telemedicine and the availability of in person appointments. The patient expressed understanding and agreed to proceed.  Subjective:   HPI:   59 year old WF, 1/2 ppd smoker is in virtually with c/o cough, congestion, sinus pressure and swelling x 1 week. She has a history of sinusitis. Low grade fever  ROS:   See pertinent positives and negatives per HPI.  Patient Active Problem List   Diagnosis Date Noted  . History of colonic polyps   . Iron deficiency anemia   . Diverticulosis of colon without hemorrhage   . Rectal polyp   . Iron deficiency 03/08/2020  . Hematochezia 03/08/2020  . Pulmonary emphysema ([place]) 03/08/2020  . Microcytic anemia 01/28/2020  . Tobacco abuse 01/28/2020  . Elevated LDL cholesterol level 01/28/2020  . Pain in both lower extremities 01/28/2020  . Dysthymia 01/28/2020  . Essential hypertension 12/23/2019  . Healthcare maintenance 12/23/2019  . Reactive airway disease 12/23/2019    Social History   Tobacco Use  . Smoking status: Current Every Day Smoker    Packs/day: 1.00    Years: 25.00    Pack years: 25.00    Types: Cigarettes  . Smokeless tobacco: Never Used  . Tobacco comment: 1/2 pack per day  Substance Use Topics  . Alcohol use: Yes    Alcohol/week: 1.0 standard drink    Types: 1 Glasses of wine per week    Comment: occasionally    Current Outpatient Medications:  .  albuterol (VENTOLIN HFA) 108 (90 Base) MCG/ACT inhaler, Inhale 1-2 puffs into the lungs every 6 (six) hours as needed for wheezing or shortness of breath., Disp: 18  g, Rfl: 0 .  cetirizine (ZYRTEC) 10 MG tablet, Take 10 mg by mouth daily., Disp: , Rfl:  .  ferrous sulfate 325 (65 FE) MG tablet, Take 162.5 mg by mouth daily with breakfast., Disp: , Rfl:  .  folic acid (FOLVITE) 1 MG tablet, Take 1 mg by mouth daily., Disp: , Rfl:  .  lisinopril (ZESTRIL) 20 MG tablet, Take 1 tablet (20 mg total) by mouth daily., Disp: 90 tablet, Rfl: 0 .  Tiotropium Bromide Monohydrate (SPIRIVA RESPIMAT) 2.5 MCG/ACT AERS, Inhale 2 puffs into the lungs daily., Disp: 4 g, Rfl: 0 .  amoxicillin (AMOXIL) 500 MG capsule, Take 1 capsule (500 mg total) by mouth 2 (two) times daily., Disp: 14 capsule, Rfl: 0 .  Iron, Ferrous Gluconate, 256 (28 Fe) MG TABS, Take one tablet daily. (Patient not taking: No sig reported), Disp: 90 tablet, Rfl: 2  No Known Allergies  Objective:   Temp 99.3 F (37.4 C) (Temporal)   Ht 5\' 4"  (1.626 m)   Wt 135 lb (61.2 kg)   BMI 23.17 kg/m   Patient is well-developed, well-nourished in no acute distress.  Resting comfortably at home.  Head is normocephalic, atraumatic.  No labored breathing.  Speech is clear and coherent with logical content.  Patient is alert and oriented at baseline.   Assessment and Plan:    Adisyn was seen today for sinusitis.  Diagnoses and all orders for this visit:  Acute bacterial sinusitis  Other  orders -     amoxicillin (AMOXIL) 500 MG capsule; Take 1 capsule (500 mg total) by mouth 2 (two) times daily.   Call the office with any questions or concerns.   Kennyth Arnold, FNP 07/15/2020

## 2020-07-20 NOTE — Telephone Encounter (Signed)
Foraker x 1 to reschedule lung screening appt

## 2020-07-26 NOTE — Telephone Encounter (Signed)
Will close this message and refer to referral notes 

## 2020-07-31 DIAGNOSIS — C3490 Malignant neoplasm of unspecified part of unspecified bronchus or lung: Secondary | ICD-10-CM

## 2020-07-31 HISTORY — DX: Malignant neoplasm of unspecified part of unspecified bronchus or lung: C34.90

## 2020-08-04 ENCOUNTER — Ambulatory Visit: Payer: 59

## 2020-08-19 ENCOUNTER — Inpatient Hospital Stay: Admission: RE | Admit: 2020-08-19 | Payer: 59 | Source: Ambulatory Visit

## 2020-09-06 ENCOUNTER — Inpatient Hospital Stay: Admission: RE | Admit: 2020-09-06 | Payer: 59 | Source: Ambulatory Visit

## 2020-09-13 ENCOUNTER — Telehealth: Payer: Self-pay | Admitting: Pulmonary Disease

## 2020-09-13 NOTE — Telephone Encounter (Signed)
FYI: Due to multiple unsuccessful attempts to contact pt to schedule lung cancer screening, this referral has been cancelled. Valerie Russell

## 2020-09-14 NOTE — Telephone Encounter (Signed)
Aware. 

## 2020-10-29 ENCOUNTER — Telehealth (INDEPENDENT_AMBULATORY_CARE_PROVIDER_SITE_OTHER): Payer: 59 | Admitting: Family Medicine

## 2020-10-29 ENCOUNTER — Encounter: Payer: Self-pay | Admitting: Family Medicine

## 2020-10-29 DIAGNOSIS — B9689 Other specified bacterial agents as the cause of diseases classified elsewhere: Secondary | ICD-10-CM | POA: Diagnosis not present

## 2020-10-29 DIAGNOSIS — J019 Acute sinusitis, unspecified: Secondary | ICD-10-CM

## 2020-10-29 DIAGNOSIS — I10 Essential (primary) hypertension: Secondary | ICD-10-CM

## 2020-10-29 DIAGNOSIS — Z720 Tobacco use: Secondary | ICD-10-CM | POA: Diagnosis not present

## 2020-10-29 MED ORDER — CLARITHROMYCIN ER 500 MG PO TB24: 1000.0000 mg | ORAL_TABLET | Freq: Every day | ORAL | 0 refills | Status: AC

## 2020-10-29 MED ORDER — LISINOPRIL 20 MG PO TABS: 20.0000 mg | ORAL_TABLET | Freq: Every day | ORAL | 0 refills | Status: DC

## 2020-10-29 NOTE — Progress Notes (Signed)
Established Patient Office Visit  Subjective:  Patient ID: Valerie Russell, female    DOB: 11-Jul-1961  Age: 60 y.o. MRN: [id number]  CC:  Chief Complaint  Patient presents with  . Acute Visit    Pt states she thinks she may have a sinus infection, has slight fever, sinus drainage, postnasal drip, congestion, pressure between eyes and nos can feel pressure in ears, has cough pt states she is not taking lisinopril, as prescibed because it makes her BP drop too low     HPI Valerie Russell presents for for eval of URI symptoms. Ongoing allergy rhinnitis symptoms over the last months. No real change in taste or smell. No new diarrhea. Some cough prod phlegm that is clear to brown. Smokes 1/2 ppd. Reports increased temp. There is frontal sinus pressure. Ears congestion. Blood pressure well controled with lisinopril.   Past Medical History:  Diagnosis Date  . Allergy    pollen  . Cervical cancer ([place])    in her 67s pt states, laser surgery to remove  . COPD (chronic obstructive pulmonary disease) ([place])   . Diverticulitis   . Hypertension   . Seasonal allergies     Past Surgical History:  Procedure Laterality Date  . APPENDECTOMY    . BIOPSY  05/10/2020   Procedure: BIOPSY;  Surgeon: Lavena Bullion, DO;  Location: [facility];  Service: Gastroenterology;;  . CESAREAN SECTION  05/04/1997  . COLONOSCOPY WITH PROPOFOL N/A 05/10/2020   Procedure: COLONOSCOPY WITH PROPOFOL;  Surgeon: Lavena Bullion, DO;  Location: [facility];  Service: Gastroenterology;  Laterality: N/A;  colonoscopy with EMR  . ENDOSCOPIC MUCOSAL RESECTION N/A 05/10/2020   Procedure: ENDOSCOPIC MUCOSAL RESECTION;  Surgeon: Lavena Bullion, DO;  Location: [facility];  Service: Gastroenterology;  Laterality: N/A;  . ESOPHAGOGASTRODUODENOSCOPY (EGD) WITH PROPOFOL N/A 05/10/2020   Procedure: ESOPHAGOGASTRODUODENOSCOPY (EGD) WITH PROPOFOL;  Surgeon: Lavena Bullion, DO;  Location: [facility];  Service:  Gastroenterology;  Laterality: N/A;  . HEMOSTASIS CLIP PLACEMENT  05/10/2020   Procedure: HEMOSTASIS CLIP PLACEMENT;  Surgeon: Lavena Bullion, DO;  Location: [facility];  Service: Gastroenterology;;  . LASER ABLATION [place] / VULVAR    . SUBMUCOSAL LIFTING INJECTION  05/10/2020   Procedure: SUBMUCOSAL LIFTING INJECTION;  Surgeon: Lavena Bullion, DO;  Location: [facility];  Service: Gastroenterology;;    Family History  Problem Relation Age of Onset  . Cancer Other   . Hypertension Mother   . COPD Mother   . Stroke Mother   . Colon cancer Neg Hx   . Colon polyps Neg Hx   . Esophageal cancer Neg Hx   . Rectal cancer Neg Hx   . Stomach cancer Neg Hx     Social History   Socioeconomic History  . Marital status: Single    Spouse name: Not on file  . Number of children: Not on file  . Years of education: Not on file  . Highest education level: Not on file  Occupational History  . Not on file  Tobacco Use  . Smoking status: Current Every Day Smoker    Packs/day: 1.00    Years: 25.00    Pack years: 25.00    Types: Cigarettes  . Smokeless tobacco: Never Used  . Tobacco comment: 1/2 pack per day  Vaping Use  . Vaping Use: Never used  Substance and Sexual Activity  . Alcohol use: Yes    Alcohol/week: 1.0 standard drink    Types: 1 Glasses of wine  per week    Comment: occasionally  . Drug use: Yes    Types: Marijuana    Comment: sometimes  . Sexual activity: Yes    Birth control/protection: None  Other Topics Concern  . Not on file  Social History Narrative  . Not on file   Social Determinants of Health   Financial Resource Strain: Not on file  Food Insecurity: Not on file  Transportation Needs: Not on file  Physical Activity: Not on file  Stress: Not on file  Social Connections: Not on file  Intimate Partner Violence: Not on file    Outpatient Medications Prior to Visit  Medication Sig Dispense Refill  . albuterol (VENTOLIN HFA) 108  (90 Base) MCG/ACT inhaler Inhale 1-2 puffs into the lungs every 6 (six) hours as needed for wheezing or shortness of breath. 18 g 0  . cetirizine (ZYRTEC) 10 MG tablet Take 10 mg by mouth daily.    . ferrous sulfate 325 (65 FE) MG tablet Take 162.5 mg by mouth daily with breakfast.    . folic acid (FOLVITE) 1 MG tablet Take 1 mg by mouth daily.    . Iron, Ferrous Gluconate, 256 (28 Fe) MG TABS Take one tablet daily. 90 tablet 2  . Tiotropium Bromide Monohydrate (SPIRIVA RESPIMAT) 2.5 MCG/ACT AERS Inhale 2 puffs into the lungs daily. 4 g 0  . lisinopril (ZESTRIL) 20 MG tablet Take 1 tablet (20 mg total) by mouth daily. 90 tablet 0  . amoxicillin (AMOXIL) 500 MG capsule Take 1 capsule (500 mg total) by mouth 2 (two) times daily. (Patient not taking: Reported on 10/29/2020) 14 capsule 0   No facility-administered medications prior to visit.    No Known Allergies  ROS Review of Systems  Constitutional: Positive for fatigue. Negative for diaphoresis, fever and unexpected weight change.  HENT: Positive for congestion, hearing loss, postnasal drip, rhinorrhea, sinus pressure, sinus pain and voice change. Negative for ear discharge, ear pain, sore throat and trouble swallowing.   Eyes: Negative for photophobia and visual disturbance.  Respiratory: Positive for cough and wheezing. Negative for chest tightness.   Cardiovascular: Negative.   Gastrointestinal: Negative for nausea and vomiting.  Endocrine: Negative for polyphagia and polyuria.  Genitourinary: Negative.   Musculoskeletal: Negative for arthralgias and myalgias.  Skin: Negative.   Allergic/Immunologic: Negative for immunocompromised state.  Neurological: Negative for speech difficulty and headaches.  Hematological: Negative for adenopathy.  Psychiatric/Behavioral: Negative.       Objective:    Physical Exam Vitals and nursing note reviewed.  Constitutional:      General: She is not in acute distress. Pulmonary:     Effort:  Pulmonary effort is normal.  Neurological:     Mental Status: She is alert and oriented to person, place, and time.  Psychiatric:        Mood and Affect: Mood normal.        Behavior: Behavior normal.     There were no vitals taken for this visit. Wt Readings from Last 3 Encounters:  07/15/20 135 lb (61.2 kg)  05/10/20 138 lb (62.6 kg)  03/24/20 139 lb (63 kg)     Health Maintenance Due  Topic Date Due  . Hepatitis C Screening  Never done  . HIV Screening  Never done  . PAP SMEAR-Modifier  Never done  . MAMMOGRAM  Never done    There are no preventive care reminders to display for this patient.  No results found for: TSH Lab Results  Component Value  Date   WBC 7.2 12/23/2019   HGB 9.9 (L) 12/23/2019   HCT 31.8 (L) 12/23/2019   MCV 76.7 (L) 12/23/2019   PLT 377.0 12/23/2019   Lab Results  Component Value Date   NA 140 12/23/2019   K 4.7 12/23/2019   CO2 26 12/23/2019   GLUCOSE 107 (H) 12/23/2019   BUN 12 12/23/2019   CREATININE 0.85 12/23/2019   BILITOT 0.3 12/23/2019   ALKPHOS 121 (H) 12/23/2019   AST 19 12/23/2019   ALT 17 12/23/2019   PROT 7.4 12/23/2019   ALBUMIN 4.4 12/23/2019   CALCIUM 9.5 12/23/2019   ANIONGAP 12 08/23/2015   GFR 68.33 12/23/2019   Lab Results  Component Value Date   CHOL 222 (H) 12/23/2019   Lab Results  Component Value Date   HDL 58.40 12/23/2019   Lab Results  Component Value Date   LDLCALC 142 (H) 12/23/2019   Lab Results  Component Value Date   TRIG 104.0 12/23/2019   Lab Results  Component Value Date   CHOLHDL 4 12/23/2019   No results found for: HGBA1C    Assessment & Plan:   Problem List Items Addressed This Visit      Cardiovascular and Mediastinum   Essential hypertension   Relevant Medications   lisinopril (ZESTRIL) 20 MG tablet     Respiratory   Acute bacterial sinusitis - Primary   Relevant Medications   clarithromycin (BIAXIN XL) 500 MG 24 hr tablet     Other   Tobacco abuse      Meds  ordered this encounter  Medications  . clarithromycin (BIAXIN XL) 500 MG 24 hr tablet    Sig: Take 2 tablets (1,000 mg total) by mouth daily for 20 days.    Dispense:  40 tablet    Refill:  0  . lisinopril (ZESTRIL) 20 MG tablet    Sig: Take 1 tablet (20 mg total) by mouth daily.    Dispense:  90 tablet    Refill:  0    Follow-up: Return follow up for medical problems and labs. Advised her to also have a Covid Test..   The patient was counseled on the dangers of tobacco use, and was advised to quit.  Reviewed strategies to maximize success, including support of family/friends.The patient was counseled on the dangers of tobacco use, and was advised to quit.  Reviewed strategies to maximize success, including removing cigarettes and smoking materials from environment.The patient was counseled on the dangers of tobacco use, and was advised to quit.  Reviewed strategies to maximize success, including support of family/friends. Libby Maw, MD   Virtual Visit via Telephone Note  I connected with Valerie Russell on 10/29/20 at  8:00 AM EDT by telephone and verified that I am speaking with the correct person using two identifiers.  Location: Patient: home Provider:    I discussed the limitations, risks, security and privacy concerns of performing an evaluation and management service by telephone and the availability of in person appointments. I also discussed with the patient that there may be a patient responsible charge related to this service. The patient expressed understanding and agreed to proceed.   History of Present Illness:    Observations/Objective:   Assessment and Plan:   Follow Up Instructions:    I discussed the assessment and treatment plan with the patient. The patient was provided an opportunity to ask questions and all were answered. The patient agreed with the plan and demonstrated an understanding of the instructions.  The patient was advised to call  back or seek an in-person evaluation if the symptoms worsen or if the condition fails to improve as anticipated.  I provided 25 minutes of non-face-to-face time during this encounter.   Libby Maw, MD

## 2020-11-09 ENCOUNTER — Telehealth: Payer: Self-pay | Admitting: Gastroenterology

## 2020-11-09 NOTE — Telephone Encounter (Signed)
Patient called to schedule a repeat colonoscopy need to confirm that she is due please advise.

## 2020-11-09 NOTE — Telephone Encounter (Signed)
Patient is due for flexible sigmoidoscopy for surveillance of polypectomy site. Dr. Bryan Lemma can this be scheduled in the [facility] or will this need to be at [facility]? Please advise, thanks.   Left detailed message letting patient know that Dr. Bryan Lemma is out of the office this week but I am checking where her procedure needs to be scheduled. Advised that I will let her know as soon as I hear something. Advised patient to call me back sooner if she had any questions.

## 2020-11-17 ENCOUNTER — Other Ambulatory Visit: Payer: Self-pay

## 2020-11-17 DIAGNOSIS — Z86010 Personal history of colonic polyps: Secondary | ICD-10-CM

## 2020-11-17 DIAGNOSIS — K621 Rectal polyp: Secondary | ICD-10-CM

## 2020-11-17 NOTE — Telephone Encounter (Signed)
Spoke with patient to schedule flexible sigmoidoscopy at [facility]. Patient has been scheduled for a pre-visit with the nurse on Thursday, 12/09/20 at 10:30 AM. Pre-screening COVID test is scheduled for Monday, 12/20/20 at 1:05 PM. Flex sig is scheduled at [facility] on Thursday, 12/23/20 at 8:30 AM. Patient is aware that she will need to arrive at 7 AM with a care partner. Patient verbalized understanding of all information and had no concerns at the end of the call.   CASE ID: [id number]  Letter mailed to patient with appt information.

## 2020-11-17 NOTE — Telephone Encounter (Signed)
Please plan to schedule to be done at [facility].  Thank you.

## 2020-12-09 ENCOUNTER — Other Ambulatory Visit: Payer: Self-pay

## 2020-12-09 ENCOUNTER — Ambulatory Visit (AMBULATORY_SURGERY_CENTER): Payer: Self-pay

## 2020-12-09 VITALS — Ht 63.0 in | Wt 137.0 lb

## 2020-12-09 DIAGNOSIS — Z86010 Personal history of colonic polyps: Secondary | ICD-10-CM

## 2020-12-09 MED ORDER — NA SULFATE-K SULFATE-MG SULF 17.5-3.13-1.6 GM/177ML PO SOLN: 1.0000 | Freq: Once | ORAL | 0 refills | Status: AC

## 2020-12-09 NOTE — Progress Notes (Addendum)
No egg or soy allergy known to patient  No issues with past sedation with any surgeries or procedures Patient denies ever being told they had issues or difficulty with intubation  No FH of Malignant Hyperthermia No diet pills per patient No home 02 use per patient  No blood thinners per patient  Pt reports issues with constipation - full colon prep advised to patient due to this issue for this procedure- patient advised to hold iron for 5 days prior to prep No A fib or A flutter  COVID 19 guidelines implemented in PV today with Pt and RN  COVID screening scheduled on 12/20/2020 at 1:05 pm at [facility]; patient is aware of appt date/time;  NO PA's for preps discussed with pt in PV today  Discussed with pt there will be an out-of-pocket cost for prep and that varies from $0 to 70 dollars  Due to the COVID-19 pandemic we are asking patients to follow certain guidelines.  Pt aware of COVID protocols and [facility] guidelines

## 2020-12-20 ENCOUNTER — Other Ambulatory Visit (HOSPITAL_COMMUNITY)
Admission: RE | Admit: 2020-12-20 | Discharge: 2020-12-20 | Disposition: A | Discharge status: Home / Self Care | Payer: 59 | Source: Ambulatory Visit | Attending: Gastroenterology | Admitting: Gastroenterology

## 2020-12-20 ENCOUNTER — Encounter (HOSPITAL_COMMUNITY): Payer: Self-pay | Admitting: Gastroenterology

## 2020-12-20 ENCOUNTER — Other Ambulatory Visit: Payer: Self-pay

## 2020-12-20 DIAGNOSIS — Z01812 Encounter for preprocedural laboratory examination: Secondary | ICD-10-CM | POA: Insufficient documentation

## 2020-12-20 DIAGNOSIS — Z20822 Contact with and (suspected) exposure to covid-19: Secondary | ICD-10-CM | POA: Diagnosis not present

## 2020-12-20 LAB — SARS CORONAVIRUS 2 (TAT 6-24 HRS): SARS Coronavirus 2: NEGATIVE

## 2020-12-22 ENCOUNTER — Telehealth: Payer: Self-pay | Admitting: Gastroenterology

## 2020-12-22 NOTE — Telephone Encounter (Signed)
Pt called and stated she has a procedure tomorrow and wants to know what type of soup she can eat. Please give her a call. Thank you

## 2020-12-22 NOTE — Telephone Encounter (Signed)
Spoke with patient, she was advised that she can only have broths or strained chicken noodle soup (no noodles, chicken, or vegetables). Patient verbalized understanding and had no concerns at the end of the call.

## 2020-12-23 ENCOUNTER — Encounter (HOSPITAL_COMMUNITY)
Admission: RE | Disposition: A | Discharge status: Home / Self Care | Payer: Self-pay | Source: Home / Self Care | Attending: Gastroenterology

## 2020-12-23 ENCOUNTER — Ambulatory Visit (HOSPITAL_COMMUNITY)
Admission: RE | Admit: 2020-12-23 | Discharge: 2020-12-23 | Disposition: A | Discharge status: Home / Self Care | Payer: 59 | Attending: Gastroenterology | Admitting: Gastroenterology

## 2020-12-23 ENCOUNTER — Other Ambulatory Visit: Payer: Self-pay

## 2020-12-23 ENCOUNTER — Ambulatory Visit (HOSPITAL_COMMUNITY): Payer: 59 | Admitting: Anesthesiology

## 2020-12-23 ENCOUNTER — Encounter (HOSPITAL_COMMUNITY): Payer: Self-pay | Admitting: Gastroenterology

## 2020-12-23 DIAGNOSIS — Z8541 Personal history of malignant neoplasm of cervix uteri: Secondary | ICD-10-CM | POA: Insufficient documentation

## 2020-12-23 DIAGNOSIS — J449 Chronic obstructive pulmonary disease, unspecified: Secondary | ICD-10-CM | POA: Insufficient documentation

## 2020-12-23 DIAGNOSIS — Z86010 Personal history of colonic polyps: Secondary | ICD-10-CM

## 2020-12-23 DIAGNOSIS — L905 Scar conditions and fibrosis of skin: Secondary | ICD-10-CM | POA: Diagnosis not present

## 2020-12-23 DIAGNOSIS — K621 Rectal polyp: Secondary | ICD-10-CM | POA: Diagnosis not present

## 2020-12-23 DIAGNOSIS — D125 Benign neoplasm of sigmoid colon: Secondary | ICD-10-CM | POA: Insufficient documentation

## 2020-12-23 DIAGNOSIS — K5730 Diverticulosis of large intestine without perforation or abscess without bleeding: Secondary | ICD-10-CM | POA: Insufficient documentation

## 2020-12-23 DIAGNOSIS — K219 Gastro-esophageal reflux disease without esophagitis: Secondary | ICD-10-CM | POA: Diagnosis not present

## 2020-12-23 DIAGNOSIS — K635 Polyp of colon: Secondary | ICD-10-CM | POA: Diagnosis not present

## 2020-12-23 DIAGNOSIS — Z85118 Personal history of other malignant neoplasm of bronchus and lung: Secondary | ICD-10-CM | POA: Diagnosis not present

## 2020-12-23 DIAGNOSIS — Z8719 Personal history of other diseases of the digestive system: Secondary | ICD-10-CM

## 2020-12-23 DIAGNOSIS — K5521 Angiodysplasia of colon with hemorrhage: Secondary | ICD-10-CM | POA: Insufficient documentation

## 2020-12-23 DIAGNOSIS — Z1211 Encounter for screening for malignant neoplasm of colon: Secondary | ICD-10-CM | POA: Diagnosis not present

## 2020-12-23 DIAGNOSIS — I10 Essential (primary) hypertension: Secondary | ICD-10-CM | POA: Insufficient documentation

## 2020-12-23 DIAGNOSIS — K641 Second degree hemorrhoids: Secondary | ICD-10-CM | POA: Diagnosis not present

## 2020-12-23 HISTORY — PX: BIOPSY: SHX5522

## 2020-12-23 HISTORY — PX: POLYPECTOMY: SHX5525

## 2020-12-23 HISTORY — PX: COLONOSCOPY WITH PROPOFOL: SHX5780

## 2020-12-23 SURGERY — POLYPECTOMY
Anesthesia: Monitor Anesthesia Care

## 2020-12-23 MED ORDER — LIDOCAINE HCL (CARDIAC) PF 100 MG/5ML IV SOSY
PREFILLED_SYRINGE | INTRAVENOUS | Status: DC | PRN
  Administered 2020-12-23: 100 mg via INTRAVENOUS

## 2020-12-23 MED ORDER — PROPOFOL 10 MG/ML IV BOLUS
INTRAVENOUS | Status: DC | PRN
  Administered 2020-12-23: 40 mg via INTRAVENOUS
  Administered 2020-12-23: 30 mg via INTRAVENOUS
  Administered 2020-12-23 (×4): 20 mg via INTRAVENOUS

## 2020-12-23 MED ORDER — LACTATED RINGERS IV SOLN: INTRAVENOUS | Status: DC | PRN

## 2020-12-23 MED ORDER — SODIUM CHLORIDE 0.9 % IV SOLN: INTRAVENOUS | Status: DC

## 2020-12-23 MED ORDER — PROPOFOL 500 MG/50ML IV EMUL
INTRAVENOUS | Status: DC | PRN
  Administered 2020-12-23: 125 ug/kg/min via INTRAVENOUS

## 2020-12-23 MED ORDER — LACTATED RINGERS IV SOLN: Freq: Once | INTRAVENOUS | Status: AC

## 2020-12-23 NOTE — Anesthesia Postprocedure Evaluation (Signed)
Anesthesia Post Note  Patient: Valerie Russell  Procedure(s) Performed: FLEXIBLE SIGMOIDOSCOPY (N/A ) POLYPECTOMY BIOPSY     Patient location during evaluation: PACU Anesthesia Type: MAC Level of consciousness: awake and alert Pain management: pain level controlled Vital Signs Assessment: post-procedure vital signs reviewed and stable Respiratory status: spontaneous breathing, nonlabored ventilation, respiratory function stable and patient connected to nasal cannula oxygen Cardiovascular status: stable and blood pressure returned to baseline Postop Assessment: no apparent nausea or vomiting Anesthetic complications: no   No complications documented.  Last Vitals:  Vitals:   12/23/20 0920 12/23/20 0925  BP: (!) 195/104 (!) 179/95  Pulse: 78 86  Resp: (!) 27 18  Temp:    SpO2: 98% 100%    Last Pain:  Vitals:   12/23/20 0925  TempSrc:   PainSc: 0-No pain                 Hanah Moultry S

## 2020-12-23 NOTE — Op Note (Signed)
[facility] Patient Name: Valerie Russell Procedure Date: 12/23/2020 MRN: [id number] Attending MD: Gerrit Heck , MD Date of Birth: 17-Dec-1960 CSN: [id number] Age: 60 Admit Type: Outpatient Procedure:                Colonoscopy Indications:              Surveillance: Personal history of piecemeal removal                            of adenoma on last colonoscopy 6 months ago                           Colonoscopy in 04/2020 with resection of 40 mm                            tubular adenoma with focal high grade dysplasia via                            piecemeal technique presents today for ongoing                            short interval surveillance. Providers:                Gerrit Heck, MD, Clyde Lundborg, RN, Tyna Jaksch Technician Referring MD:              Medicines:                Monitored Anesthesia Care Complications:            No immediate complications. Estimated Blood Loss:     Estimated blood loss was minimal. Procedure:                Pre-Anesthesia Assessment:                           - Prior to the procedure, a History and Physical                            was performed, and patient medications and                            allergies were reviewed. The patient's tolerance of                            previous anesthesia was also reviewed. The risks                            and benefits of the procedure and the sedation                            options and risks were discussed with the patient.                            All questions  were answered, and informed consent                            was obtained. Prior Anticoagulants: The patient has                            taken no previous anticoagulant or antiplatelet                            agents. ASA Grade Assessment: II - A patient with                            mild systemic disease. After reviewing the risks                            and  benefits, the patient was deemed in                            satisfactory condition to undergo the procedure.                           After obtaining informed consent, the colonoscope                            was passed under direct vision. Throughout the                            procedure, the patient's blood pressure, pulse, and                            oxygen saturations were monitored continuously. The                            [id number]-[id number] ([id number]) Olympus pediatric colonscope                            was introduced through the anus and advanced to the                            the cecum, identified by appendiceal orifice and                            ileocecal valve. After obtaining informed consent,                            the colonoscope was passed under direct vision.                            Throughout the procedure, the patient's blood                            pressure, pulse, and oxygen saturations were  monitored continuously.The flexible sigmoidoscopy                            was accomplished without difficulty. The patient                            tolerated the procedure well. The quality of the                            bowel preparation was good. The ileocecal valve,                            appendiceal orifice, and rectum were photographed. Scope In: 8:29:43 AM Scope Out: 8:50:02 AM Scope Withdrawal Time: 0 hours 16 minutes 6 seconds  Total Procedure Duration: 0 hours 20 minutes 19 seconds  Findings:      The perianal and digital rectal examinations were normal.      A single small angioectasia without bleeding was found in the cecum.      Four sessile polyps were found in the distal sigmoid colon. The polyps       were 1 to 3 mm in size. These polyps were removed with a cold biopsy       forceps. Resection and retrieval were complete. Estimated blood loss was       minimal.      Three sessile polyps were found in  the rectum. The polyps were 1 to 2 mm       in size. These polyps were removed with a cold biopsy forceps. Resection       and retrieval were complete. Estimated blood loss was minimal.      A post polypectomy scar was found in the rectum. There was no evidence       of the previous polyp. Biopsies were taken with a cold forceps for       histology. Estimated blood loss was minimal.      Multiple small-mouthed diverticula were found in the sigmoid colon.      Non-bleeding internal hemorrhoids were found during retroflexion. The       hemorrhoids were medium-sized and Grade II (internal hemorrhoids that       prolapse but reduce spontaneously). Impression:               - A single non-bleeding colonic angioectasia.                           - Four 1 to 3 mm polyps in the distal sigmoid                            colon, removed with a cold biopsy forceps. Resected                            and retrieved.                           - Three 1 to 2 mm polyps in the rectum, removed                            with a  cold biopsy forceps. Resected and retrieved.                           - Post-polypectomy scar in the rectum. Biopsied.                           - Diverticulosis in the sigmoid colon.                           - Non-bleeding internal hemorrhoids. Moderate Sedation:      Not Applicable - Patient had care per Anesthesia. Recommendation:           - Discharge patient to home (with escort).                           - Continue present medications.                           - Await pathology results.                           - If no adenomatous tissue on today's pathology,                            plan for repeat colonoscopy in 3 years.                           - Internal hemorrhoids were noted on this study and                            may be amenable to hemorrhoid band ligation. If you                            are interested in further treatment of these                             hemorrhoids with band ligation, please contact my                            clinic to set up an appointment for evaluation and                            treatment. Procedure Code(s):        --- Professional ---                           [phone], Colonoscopy, flexible; with biopsy, single                            or multiple Diagnosis Code(s):        --- Professional ---                           K55.20, Angiodysplasia of colon without hemorrhage  K63.5, Polyp of colon                           K62.1, Rectal polyp                           Z98.890, Other specified postprocedural states                           K64.1, Second degree hemorrhoids                           Z09, Encounter for follow-up examination after                            completed treatment for conditions other than                            malignant neoplasm                           Z86.010, Personal history of colonic polyps                           K57.30, Diverticulosis of large intestine without                            perforation or abscess without bleeding CPT copyright 2019 American Medical Association. All rights reserved. The codes documented in this report are preliminary and upon coder review may  be revised to meet current compliance requirements. Gerrit Heck, MD 12/23/2020 9:03:26 AM Number of Addenda: 0

## 2020-12-23 NOTE — Discharge Instructions (Signed)

## 2020-12-23 NOTE — Transfer of Care (Signed)
Immediate Anesthesia Transfer of Care Note  Patient: Valerie Russell  Procedure(s) Performed: FLEXIBLE SIGMOIDOSCOPY (N/A ) POLYPECTOMY BIOPSY  Patient Location: Endoscopy Unit  Anesthesia Type:MAC  Level of Consciousness: awake, alert , oriented and patient cooperative  Airway & Oxygen Therapy: Patient Spontanous Breathing and Patient connected to face mask oxygen  Post-op Assessment: Report given to RN and Post -op Vital signs reviewed and stable  Post vital signs: Reviewed and stable  Last Vitals:  Vitals Value Taken Time  BP 113/85 0853  Temp    Pulse 77 12/23/20 0855  Resp 19 12/23/20 0855  SpO2 100 % 12/23/20 0855  Vitals shown include unvalidated device data.  Last Pain:  Vitals:   12/23/20 0740  TempSrc: Oral  PainSc: 0-No pain         Complications: No complications documented.

## 2020-12-23 NOTE — H&P (Signed)
    P  Chief Complaint:    Polyp surveillance  HPI:     Patient is a 60 y.o. female presenting to [facility] long Endoscopy Unit for polyp surveillance.  Colonoscopy in 04/2020 with 30 mm tubular adenoma with focal high-grade dysplasia resected from the proximal rectum using Ella view and piecemeal resection and closed with hemostatic clips x4.  She presents today for short interval surveillance.  Does report intermittent painless BRBPR.  Has had 3-4 episodes of this over the last few months.  No associated abdominal pain, change in bowel habits, weight loss.   Review of systems:     No chest pain, no SOB, no fevers, no urinary sx   Past Medical History:  Diagnosis Date  . Allergy    seasonal allergies  . Anemia    on meds  . Cervical cancer ([place])    in her 66s pt states, laser surgery to remove  . COPD (chronic obstructive pulmonary disease) ([facility])    uses inhaler  . Diverticulitis   . GERD (gastroesophageal reflux disease)    on meds  . Hypertension    on meds  . Lung cancer ([place]) 2022   per pt (12/09/2020)  . Seasonal allergies     Patient's surgical history, family medical history, social history, medications and allergies were all reviewed in Epic    Current Facility-Administered Medications  Medication Dose Route Frequency Provider Last Rate Last Admin  . 0.9 %  sodium chloride infusion   Intravenous Continuous Jaeli Grubb V, DO        Physical Exam:     BP (!) 172/93   Pulse 95   Temp 97.9 F (36.6 C) (Oral)   Resp (!) 22   Ht 5\' 3"  (1.6 m)   Wt 61.2 kg   SpO2 95%   BMI 23.91 kg/m   GENERAL:  Pleasant female in NAD PSYCH: : Cooperative, normal affect EENT:  conjunctiva pink, mucous membranes moist, neck supple without masses ABDOMEN:  Nondistended, soft, nontender. No obvious masses, no hepatomegaly,  normal bowel sounds SKIN:  turgor, no lesions seen Musculoskeletal:  Normal muscle tone, normal strength NEURO: Alert and oriented x 3, no focal  neurologic deficits   IMPRESSION and PLAN:    1) History of colon polyps - Flexible sigmoidoscopy today for surveillance of previous polypectomy site  2) Hematochezia - Evaluate for etiology during flexible sigmoidoscopy today - Discussed possibility of hemorrhoid banding based on endoscopic appearance          [place] ,DO, FACG 12/23/2020, 8:21 AM

## 2020-12-23 NOTE — Anesthesia Preprocedure Evaluation (Signed)
Anesthesia Evaluation  Patient identified by MRN, date of birth, ID band Patient awake    Reviewed: Allergy & Precautions, NPO status , Patient's Chart, lab work & pertinent test results  Airway Mallampati: II  TM Distance: >3 FB Neck ROM: Full    Dental no notable dental hx.    Pulmonary COPD,  COPD inhaler, Current Smoker,  Lung cancer   Pulmonary exam normal breath sounds clear to auscultation       Cardiovascular hypertension, Pt. on medications Normal cardiovascular exam Rhythm:Regular Rate:Normal     Neuro/Psych negative neurological ROS  negative psych ROS   GI/Hepatic Neg liver ROS, GERD  ,  Endo/Other  negative endocrine ROS  Renal/GU negative Renal ROS  negative genitourinary   Musculoskeletal negative musculoskeletal ROS (+)   Abdominal   Peds negative pediatric ROS (+)  Hematology negative hematology ROS (+)   Anesthesia Other Findings   Reproductive/Obstetrics negative OB ROS                             Anesthesia Physical Anesthesia Plan  ASA: III  Anesthesia Plan: MAC   Post-op Pain Management:    Induction: Intravenous  PONV Risk Score and Plan: 2 and Propofol infusion and Treatment may vary due to age or medical condition  Airway Management Planned: Simple Face Mask  Additional Equipment:   Intra-op Plan:   Post-operative Plan:   Informed Consent: I have reviewed the patients History and Physical, chart, labs and discussed the procedure including the risks, benefits and alternatives for the proposed anesthesia with the patient or authorized representative who has indicated his/her understanding and acceptance.     Dental advisory given  Plan Discussed with: CRNA and Surgeon  Anesthesia Plan Comments:         Anesthesia Quick Evaluation

## 2020-12-23 NOTE — Anesthesia Procedure Notes (Signed)
Procedure Name: MAC Date/Time: 12/23/2020 8:22 AM Performed by: Niel Hummer, CRNA Pre-anesthesia Checklist: Patient identified, Emergency Drugs available, Suction available and Patient being monitored Oxygen Delivery Method: Simple face mask

## 2020-12-23 NOTE — Interval H&P Note (Signed)
History and Physical Interval Note:  12/23/2020 8:23 AM  Valerie Russell  has presented today for surgery, with the diagnosis of surveillance of polypectomy site.  The various methods of treatment have been discussed with the patient and family. After consideration of risks, benefits and other options for treatment, the patient has consented to  Procedure(s): FLEXIBLE SIGMOIDOSCOPY (N/A) as a surgical intervention.  The patient's history has been reviewed, patient examined, no change in status, stable for surgery.  I have reviewed the patient's chart and labs.  Questions were answered to the patient's satisfaction.     Dominic Pea Abrial Arrighi

## 2020-12-24 LAB — SURGICAL PATHOLOGY

## 2020-12-26 ENCOUNTER — Encounter (HOSPITAL_COMMUNITY): Payer: Self-pay | Admitting: Gastroenterology

## 2020-12-31 ENCOUNTER — Encounter: Payer: Self-pay | Admitting: Gastroenterology

## 2020-12-31 ENCOUNTER — Ambulatory Visit: Payer: 59 | Admitting: Pulmonary Disease

## 2021-01-10 ENCOUNTER — Ambulatory Visit: Payer: 59 | Admitting: Family Medicine

## 2021-01-10 DIAGNOSIS — Z0289 Encounter for other administrative examinations: Secondary | ICD-10-CM

## 2021-01-12 ENCOUNTER — Inpatient Hospital Stay: Admission: RE | Admit: 2021-01-12 | Payer: 59 | Source: Ambulatory Visit

## 2021-01-12 ENCOUNTER — Encounter: Payer: 59 | Admitting: Acute Care

## 2021-01-12 ENCOUNTER — Telehealth: Payer: Self-pay | Admitting: Acute Care

## 2021-01-12 NOTE — Telephone Encounter (Signed)
Spoke with pt and rescheduled [facility] 02/11/21 9:00.  Ct will be rescheduled. Pt verbalized understanding. Nothing further needed.

## 2021-01-18 ENCOUNTER — Ambulatory Visit: Payer: 59 | Admitting: Pulmonary Disease

## 2021-01-24 ENCOUNTER — Encounter (HOSPITAL_COMMUNITY): Payer: Self-pay | Admitting: Emergency Medicine

## 2021-01-24 ENCOUNTER — Other Ambulatory Visit: Payer: Self-pay

## 2021-01-24 ENCOUNTER — Emergency Department (HOSPITAL_COMMUNITY)
Admission: EM | Admit: 2021-01-24 | Discharge: 2021-01-24 | Disposition: A | Discharge status: Home / Self Care | Payer: 59 | Attending: Emergency Medicine | Admitting: Emergency Medicine

## 2021-01-24 ENCOUNTER — Emergency Department (HOSPITAL_COMMUNITY): Payer: 59

## 2021-01-24 DIAGNOSIS — J449 Chronic obstructive pulmonary disease, unspecified: Secondary | ICD-10-CM | POA: Insufficient documentation

## 2021-01-24 DIAGNOSIS — M7061 Trochanteric bursitis, right hip: Secondary | ICD-10-CM | POA: Insufficient documentation

## 2021-01-24 DIAGNOSIS — Z85828 Personal history of other malignant neoplasm of skin: Secondary | ICD-10-CM | POA: Insufficient documentation

## 2021-01-24 DIAGNOSIS — Z79899 Other long term (current) drug therapy: Secondary | ICD-10-CM | POA: Insufficient documentation

## 2021-01-24 DIAGNOSIS — F17210 Nicotine dependence, cigarettes, uncomplicated: Secondary | ICD-10-CM | POA: Diagnosis not present

## 2021-01-24 DIAGNOSIS — Z85118 Personal history of other malignant neoplasm of bronchus and lung: Secondary | ICD-10-CM | POA: Diagnosis not present

## 2021-01-24 DIAGNOSIS — I10 Essential (primary) hypertension: Secondary | ICD-10-CM | POA: Diagnosis not present

## 2021-01-24 DIAGNOSIS — X509XXA Other and unspecified overexertion or strenuous movements or postures, initial encounter: Secondary | ICD-10-CM | POA: Diagnosis not present

## 2021-01-24 DIAGNOSIS — Y9389 Activity, other specified: Secondary | ICD-10-CM | POA: Insufficient documentation

## 2021-01-24 DIAGNOSIS — M25551 Pain in right hip: Secondary | ICD-10-CM | POA: Diagnosis present

## 2021-01-24 DIAGNOSIS — J45909 Unspecified asthma, uncomplicated: Secondary | ICD-10-CM | POA: Insufficient documentation

## 2021-01-24 MED ORDER — TRIAMCINOLONE ACETONIDE 40 MG/ML IJ SUSP
40.0000 mg | Freq: Once | INTRAMUSCULAR | Status: AC
  Administered 2021-01-24: 40 mg
  Filled 2021-01-24: qty 1

## 2021-01-24 MED ORDER — LIDOCAINE HCL (PF) 1 % IJ SOLN
5.0000 mL | Freq: Once | INTRAMUSCULAR | Status: AC
  Administered 2021-01-24: 5 mL
  Filled 2021-01-24: qty 30

## 2021-01-24 MED ORDER — TRIAMCINOLONE ACETONIDE 40 MG/ML IJ SUSP
40.0000 mg | Freq: Once | INTRAMUSCULAR | Status: AC
  Administered 2021-01-24: 40 mg via INTRAMUSCULAR
  Filled 2021-01-24: qty 1

## 2021-01-24 NOTE — ED Triage Notes (Signed)
Pt arrives POV with complaints of hip pain that she has been having pain for approx 3 weeks now, and is worsening yesterday and today. Pt states she bent over yesterday and felt a pop and has been in pain since than. Pain mainly on R side and increases in pain with ambulation

## 2021-01-24 NOTE — ED Provider Notes (Signed)
[facility] DEPT Provider Note   CSN: [id number] Arrival date & time: 01/24/21  1122     History Chief Complaint  Patient presents with   Hip Pain    Valerie Russell is a 60 y.o. female.  Pain is located at the lateral aspect of the hip and does not radiate.  No numbness or tingling.  She denies any trauma.   Hip Pain This is a new problem. Episode onset: 3 weeks. The problem occurs constantly. The problem has not changed since onset.Pertinent negatives include no chest pain, no abdominal pain, no headaches and no shortness of breath. The symptoms are aggravated by walking. Nothing relieves the symptoms. Treatments tried: aleve. The treatment provided mild relief.      Past Medical History:  Diagnosis Date   Allergy    seasonal allergies   Anemia    on meds   Cervical cancer ([place])    in her 73s pt states, laser surgery to remove   COPD (chronic obstructive pulmonary disease) (HCC)    uses inhaler   Diverticulitis    GERD (gastroesophageal reflux disease)    on meds   Hypertension    on meds   Lung cancer ([place]) 2022   per pt (12/09/2020)   Seasonal allergies     Patient Active Problem List   Diagnosis Date Noted   Multiple polyps of sigmoid colon    Grade II internal hemorrhoids    Acute bacterial sinusitis 10/29/2020   History of rectal polyps    Iron deficiency anemia    Diverticulosis of colon without hemorrhage    Rectal polyp    Iron deficiency 03/08/2020   Hematochezia 03/08/2020   Pulmonary emphysema ([place]) 03/08/2020   Microcytic anemia 01/28/2020   Tobacco abuse 01/28/2020   Elevated LDL cholesterol level 01/28/2020   Pain in both lower extremities 01/28/2020   Dysthymia 01/28/2020   Essential hypertension 12/23/2019   Healthcare maintenance 12/23/2019   Reactive airway disease 12/23/2019    Past Surgical History:  Procedure Laterality Date   APPENDECTOMY     BIOPSY  05/10/2020   Procedure: BIOPSY;  Surgeon:  Lavena Bullion, DO;  Location: [facility];  Service: Gastroenterology;;   BIOPSY  12/23/2020   Procedure: BIOPSY;  Surgeon: Lavena Bullion, DO;  Location: [facility];  Service: Gastroenterology;;   CESAREAN SECTION  05/04/1997   COLONOSCOPY WITH PROPOFOL N/A 05/10/2020   Procedure: COLONOSCOPY WITH PROPOFOL;  Surgeon: Lavena Bullion, DO;  Location: [facility];  Service: Gastroenterology;  Laterality: N/A;  colonoscopy with EMR   COLONOSCOPY WITH PROPOFOL N/A 12/23/2020   Procedure: COLONOSCOPY WITH PROPOFOL;  Surgeon: Lavena Bullion, DO;  [facility]: [facility];  Service: Gastroenterology;  Laterality: N/A;   ENDOSCOPIC MUCOSAL RESECTION N/A 05/10/2020   Procedure: ENDOSCOPIC MUCOSAL RESECTION;  Surgeon: Lavena Bullion, DO;  [facility]: [facility];  Service: Gastroenterology;  Laterality: N/A;   ESOPHAGOGASTRODUODENOSCOPY (EGD) WITH PROPOFOL N/A 05/10/2020   Procedure: ESOPHAGOGASTRODUODENOSCOPY (EGD) WITH PROPOFOL;  Surgeon: Lavena Bullion, DO;  [facility]: [facility];  Service: Gastroenterology;  Laterality: N/A;   HEMOSTASIS CLIP PLACEMENT  05/10/2020   Procedure: HEMOSTASIS CLIP PLACEMENT;  Surgeon: Lavena Bullion, DO;  [facility]: [facility];  Service: Gastroenterology;;   LASER ABLATION CONDYLOMA CERVICAL / VULVAR     POLYPECTOMY  12/23/2020   Procedure: POLYPECTOMY;  Surgeon: Lavena Bullion, DO;  [facility];  Service: Gastroenterology;;   SUBMUCOSAL LIFTING INJECTION  05/10/2020   Procedure: SUBMUCOSAL LIFTING INJECTION;  Surgeon: Lavena Bullion, DO;  [facility];  Service: Gastroenterology;;   WISDOM TOOTH EXTRACTION       OB History   No obstetric history on file.     Family History  Problem Relation Age of Onset   Cancer Other    Hypertension Mother    COPD Mother    Stroke Mother    Colon cancer Neg Hx    Colon polyps Neg Hx    Esophageal cancer Neg Hx    Rectal cancer Neg Hx    Stomach cancer Neg Hx      Social History   Tobacco Use   Smoking status: Every Day    Packs/day: 0.25    Years: 25.00    Pack years: 6.25    Types: Cigarettes   Smokeless tobacco: Never  Vaping Use   Vaping Use: Never used  Substance Use Topics   Alcohol use: Yes    Alcohol/week: 4.0 standard drinks    Types: 4 Standard drinks or equivalent per week    Comment: occasionally   Drug use: Not Currently    Types: Marijuana    Comment: sometimes    Home Medications Prior to Admission medications   Medication Sig Start Date End Date Taking? Authorizing Provider  albuterol (VENTOLIN HFA) 108 (90 Base) MCG/ACT inhaler Inhale 1-2 puffs into the lungs every 6 (six) hours as needed for wheezing or shortness of breath. 01/28/20   Libby Maw, MD  cetirizine (ZYRTEC) 10 MG tablet Take 10 mg by mouth daily.    [provider]  esomeprazole (NEXIUM) 20 MG capsule Take 20 mg by mouth daily at 12 noon.    [provider]  ferrous sulfate 325 (65 FE) MG tablet Take 325 mg by mouth daily with breakfast.    [provider]  folic acid (FOLVITE) 1 MG tablet Take 1 mg by mouth daily.    [provider]  lisinopril (ZESTRIL) 20 MG tablet Take 1 tablet (20 mg total) by mouth daily. Patient taking differently: Take 20 mg by mouth daily as needed (high blood pressure). 10/29/20   Libby Maw, MD  Magnesium 500 MG TABS Take 500 mg by mouth daily.    [provider]  Multiple Vitamin (MULTIVITAMIN PO) Take 1 tablet by mouth daily.    [provider]  Tiotropium Bromide Monohydrate (SPIRIVA RESPIMAT) 2.5 MCG/ACT AERS Inhale 2 puffs into the lungs daily. Patient taking differently: Inhale 2 puffs into the lungs daily as needed (SOB). 02/04/20   Laurin Coder, MD    Allergies    Patient has no known allergies.  Review of Systems   Review of Systems  Constitutional:  Negative for chills and fever.  HENT:  Negative for ear pain and sore throat.    Eyes:  Negative for pain and visual disturbance.  Respiratory:  Negative for cough and shortness of breath.   Cardiovascular:  Negative for chest pain and palpitations.  Gastrointestinal:  Negative for abdominal pain and vomiting.  Genitourinary:  Negative for dysuria and hematuria.  Musculoskeletal:  Negative for arthralgias and back pain.  Skin:  Negative for color change and rash.  Neurological:  Negative for seizures, syncope and headaches.  All other systems reviewed and are negative.  Physical Exam Updated Vital Signs BP (!) 171/98   Pulse (!) 105   Temp 98.2 F (36.8 C) (Oral)   Resp 20   SpO2 96%   Physical Exam Vitals and nursing note reviewed.  HENT:     Head: Normocephalic and atraumatic.  Eyes:     General: No scleral icterus. Pulmonary:     Effort: Pulmonary effort is normal. No respiratory distress.  Musculoskeletal:     Cervical back: Normal range of motion.     Comments: The right hip is normal to inspection.  There is tenderness to palpation at the lateral aspect of the hip over the trochanteric bursa.  Hip range of motion is normal.  Sensation is normal.  Distal pulses are full.  Skin:    General: Skin is warm and dry.  Neurological:     General: No focal deficit present.     Mental Status: She is alert and oriented to person, place, and time.  Psychiatric:        Mood and Affect: Mood normal.    ED Results / Procedures / Treatments   Labs (all labs ordered are listed, but only abnormal results are displayed) Labs Reviewed - No data to display  EKG None  Radiology DG Hip Unilat With Pelvis 2-3 Views Right  Result Date: 01/24/2021 CLINICAL DATA:  Right hip pain EXAM: DG HIP (WITH OR WITHOUT PELVIS) 2-3V RIGHT COMPARISON:  None. FINDINGS: Alignment is anatomic. No acute fracture. Joint space is preserved. No intrinsic osseous lesion. IMPRESSION: Negative. Electronically Signed   By: Macy Mis M.D.   On: 01/24/2021 13:23    Procedures .Joint  Aspiration/Arthrocentesis  Date/Time: 01/24/2021 2:06 PM Performed by: Arnaldo Natal, MD Authorized by: Arnaldo Natal, MD   Consent:    Consent obtained:  Verbal   Consent given by:  Patient   Risks discussed:  Infection and pain   Alternatives discussed:  No treatment, delayed treatment, alternative treatment, observation and referral Universal protocol:    Immediately prior to procedure, a time out was called: yes     Patient identity confirmed:  Verbally with patient Location:    Location:  Hip   Hip joint: right trochanteric bursa. Anesthesia:    Anesthesia method:  None Procedure details:    Preparation: Patient was prepped and draped in usual sterile fashion     Needle gauge:  22 G   Ultrasound guidance: no     Approach:  Lateral   Steroid injected: yes (40 mg kenalog, 2 cc 1% lidocaine)   Post-procedure details:    Procedure completion:  Tolerated well, no immediate complications   Medications Ordered in ED Medications  lidocaine (PF) (XYLOCAINE) 1 % injection 5 mL (5 mLs Other Given 01/24/21 1320)  triamcinolone acetonide (KENALOG-40) injection 40 mg (40 mg Intramuscular Given 01/24/21 1320)  lidocaine (PF) (XYLOCAINE) 1 % injection 5 mL (5 mLs Other Given by Other 01/24/21 1355)  triamcinolone acetonide (KENALOG-40) injection 40 mg (40 mg Other Given by Other 01/24/21 1356)    ED Course  I have reviewed the triage vital signs and the nursing notes.  Pertinent labs & imaging results that were available during my care of the patient were reviewed by me and considered in my medical decision making (see chart for details).    MDM Rules/Calculators/A&P                          Lemar Lofty presents with right lateral hip pain.  Symptoms seem consistent with trochanteric bursitis/gluteal tendinitis.  Hip x-rays are normal and do not reveal significant arthritis or any pathologic process.  Unlikely related to her low back.  She was given an injection today  and will be  referred to orthopedics for persistent pain. Final Clinical Impression(s) / ED Diagnoses Final diagnoses:  Greater trochanteric bursitis of right hip    Rx / DC Orders ED Discharge Orders     None        Arnaldo Natal, MD 01/24/21 1408

## 2021-01-24 NOTE — ED Notes (Signed)
Patient repeatedly asking how long the Xray will take. This RN explained to Pt that it takes some time. Pt annoyed and becoming agitated.

## 2021-01-26 ENCOUNTER — Ambulatory Visit: Payer: 59 | Admitting: Gastroenterology

## 2021-01-27 ENCOUNTER — Ambulatory Visit (INDEPENDENT_AMBULATORY_CARE_PROVIDER_SITE_OTHER): Payer: 59 | Admitting: Surgery

## 2021-01-27 ENCOUNTER — Ambulatory Visit (INDEPENDENT_AMBULATORY_CARE_PROVIDER_SITE_OTHER): Payer: 59

## 2021-01-27 ENCOUNTER — Other Ambulatory Visit: Payer: Self-pay | Admitting: Family Medicine

## 2021-01-27 ENCOUNTER — Encounter: Payer: Self-pay | Admitting: Surgery

## 2021-01-27 VITALS — BP 199/118 | HR 108 | Ht 63.0 in | Wt 135.0 lb

## 2021-01-27 DIAGNOSIS — M25551 Pain in right hip: Secondary | ICD-10-CM

## 2021-01-27 DIAGNOSIS — I10 Essential (primary) hypertension: Secondary | ICD-10-CM

## 2021-01-27 DIAGNOSIS — M5416 Radiculopathy, lumbar region: Secondary | ICD-10-CM | POA: Diagnosis not present

## 2021-01-27 DIAGNOSIS — M7061 Trochanteric bursitis, right hip: Secondary | ICD-10-CM | POA: Diagnosis not present

## 2021-01-27 MED ORDER — BUPIVACAINE HCL 0.5 % IJ SOLN
6.0000 mL | INTRAMUSCULAR | Status: AC | PRN
  Administered 2021-01-27: 6 mL via INTRA_ARTICULAR

## 2021-01-27 MED ORDER — LIDOCAINE HCL 1 % IJ SOLN
3.0000 mL | INTRAMUSCULAR | Status: AC | PRN
  Administered 2021-01-27: 3 mL

## 2021-01-27 NOTE — Progress Notes (Signed)
Office Visit Note   Patient: Valerie Russell           Date of Birth: 08/31/1960           MRN: [id number] Visit Date: 01/27/2021              Requested by: Libby Maw, MD [address] [place],  [place] [address] PCP: Libby Maw, MD   Assessment & Plan: Visit Diagnoses:  1. Pain in right hip   2. Greater trochanteric bursitis, right   3. Radiculopathy, lumbar region     Plan: Advised patient that she may have 2 problems contributing to her right hip and buttock pain.  She may have a lumbar disc issue along with right greater trochanteric bursitis.  I am not sure if the greater trochanter bursa injection that was performed in the ER went to the right spot since patient states that she did not have any relief whatsoever.  I elected to repeat this today.  After patient consent right lateral hip was prepped with Betadine and greater trochanter bursa Marcaine/betamethasone 6-1 injection was performed.  After sitting for few minutes she reported good relief of her right lateral hip pain with anesthetic in place.  Reviewed x-rays with patient today.  Follow-Up Instructions: Return in about 3 weeks (around 02/17/2021) for With [facility] recheck right hip.   Orders:  Orders Placed This Encounter  Procedures   XR Lumbar Spine Complete   No orders of the defined types were placed in this encounter.     Procedures: Large Joint Inj: R greater trochanter on 01/27/2021 10:45 AM Indications: pain Details: 22 G 1.5 in needle, lateral approach Medications: 3 mL lidocaine 1 %; 6 mL bupivacaine 0.5 % Outcome: tolerated well, no immediate complications  Marcaine/betamethasone 6-1 greater trochanter bursa injection performed Consent was given by the patient. Patient was prepped and draped in the usual sterile fashion.      Clinical Data: No additional findings.   Subjective: Chief Complaint  Patient presents with   Right Hip - Pain    HPI 60 year old white  female who is new patient to clinic comes in today with complaints of right lateral hip and buttock pain.  Patient states that pain has been ongoing for about 1 to 2 weeks.  No injury.  Most of her pain is in the lateral hip and aggravated when she is ambulating and laying on her side.  She also reports having pain that extends into the right buttock.  Nothing on the left side.  No pain radiating further down the leg.  Patient was seen in the emergency department for this pain and was given a right buttock IM injection and also she had a right hip greater trochanter bursa injection.  She states that the buttock was done in error.  She did not have any relief with the lateral hip injection in the ER.  No complaints of groin pain. Review of Systems No current cardiopulmonary GI GU  Objective: Vital Signs: BP (!) 199/118   Pulse (!) 108   Ht 5\' 3"  (1.6 m)   Wt 135 lb (61.2 kg)   BMI 23.91 kg/m   Physical Exam HENT:     Head: Atraumatic.  Eyes:     Extraocular Movements: Extraocular movements intact.  Pulmonary:     Effort: No respiratory distress.  Musculoskeletal:     Comments: Gait is antalgic.  Patient has marked tenderness over the right hip greater trochanter bursa and this extends  midway down the IT band.  Moderate right buttock tenderness.  Negative on the left side.  Negative logroll bilateral hips.  Negative straight leg raise.  No focal motor deficits.  Neurological:     General: No focal deficit present.     Mental Status: She is alert.  Psychiatric:        Mood and Affect: Mood normal.    Ortho Exam  Specialty Comments:  No specialty comments available.  Imaging: No results found.   PMFS History: Patient Active Problem List   Diagnosis Date Noted   Multiple polyps of sigmoid colon    Grade II internal hemorrhoids    Acute bacterial sinusitis 10/29/2020   History of rectal polyps    Iron deficiency anemia    Diverticulosis of colon without hemorrhage    Rectal  polyp    Iron deficiency 03/08/2020   Hematochezia 03/08/2020   Pulmonary emphysema ([place]) 03/08/2020   Microcytic anemia 01/28/2020   Tobacco abuse 01/28/2020   Elevated LDL cholesterol level 01/28/2020   Pain in both lower extremities 01/28/2020   Dysthymia 01/28/2020   Essential hypertension 12/23/2019   Healthcare maintenance 12/23/2019   Reactive airway disease 12/23/2019   Past Medical History:  Diagnosis Date   Allergy    seasonal allergies   Anemia    on meds   Cervical cancer ([place])    in her 46s pt states, laser surgery to remove   COPD (chronic obstructive pulmonary disease) (HCC)    uses inhaler   Diverticulitis    GERD (gastroesophageal reflux disease)    on meds   Hypertension    on meds   Lung cancer ([place]) 2022   per pt (12/09/2020)   Seasonal allergies     Family History  Problem Relation Age of Onset   Cancer Other    Hypertension Mother    COPD Mother    Stroke Mother    Colon cancer Neg Hx    Colon polyps Neg Hx    Esophageal cancer Neg Hx    Rectal cancer Neg Hx    Stomach cancer Neg Hx     Past Surgical History:  Procedure Laterality Date   APPENDECTOMY     BIOPSY  05/10/2020   Procedure: BIOPSY;  Surgeon: Lavena Bullion, DO;  [facility];  Service: Gastroenterology;;   BIOPSY  12/23/2020   Procedure: BIOPSY;  Surgeon: Lavena Bullion, DO;  [facility];  Service: Gastroenterology;;   CESAREAN SECTION  05/04/1997   COLONOSCOPY WITH PROPOFOL N/A 05/10/2020   Procedure: COLONOSCOPY WITH PROPOFOL;  Surgeon: Lavena Bullion, DO;  [facility];  Service: Gastroenterology;  Laterality: N/A;  colonoscopy with EMR   COLONOSCOPY WITH PROPOFOL N/A 12/23/2020   Procedure: COLONOSCOPY WITH PROPOFOL;  Surgeon: Lavena Bullion, DO;  [facility];  Service: Gastroenterology;  Laterality: N/A;   ENDOSCOPIC MUCOSAL RESECTION N/A 05/10/2020   Procedure: ENDOSCOPIC MUCOSAL RESECTION;  Surgeon: Lavena Bullion, DO;  [facility];  Service: Gastroenterology;  Laterality: N/A;   ESOPHAGOGASTRODUODENOSCOPY (EGD) WITH PROPOFOL N/A 05/10/2020   Procedure: ESOPHAGOGASTRODUODENOSCOPY (EGD) WITH PROPOFOL;  Surgeon: Lavena Bullion, DO;  [facility];  Service: Gastroenterology;  Laterality: N/A;   HEMOSTASIS CLIP PLACEMENT  05/10/2020   Procedure: HEMOSTASIS CLIP PLACEMENT;  Surgeon: Lavena Bullion, DO;  [facility];  Service: Gastroenterology;;   LASER ABLATION CONDYLOMA CERVICAL / VULVAR     POLYPECTOMY  12/23/2020   Procedure: POLYPECTOMY;  Surgeon: Gerrit Heck  V, DO;  [facility];  Service: Gastroenterology;;   SUBMUCOSAL LIFTING INJECTION  05/10/2020   Procedure: SUBMUCOSAL LIFTING INJECTION;  Surgeon: Lavena Bullion, DO;  [facility];  Service: Gastroenterology;;   WISDOM TOOTH EXTRACTION     Social History   Occupational History   Not on file  Tobacco Use   Smoking status: Every Day    Packs/day: 0.25    Years: 25.00    Pack years: 6.25    Types: Cigarettes   Smokeless tobacco: Never  Vaping Use   Vaping Use: Never used  Substance and Sexual Activity   Alcohol use: Yes    Alcohol/week: 4.0 standard drinks    Types: 4 Standard drinks or equivalent per week    Comment: occasionally   Drug use: Not Currently    Types: Marijuana    Comment: sometimes   Sexual activity: Yes    Birth control/protection: None

## 2021-02-01 ENCOUNTER — Other Ambulatory Visit: Payer: Self-pay

## 2021-02-01 ENCOUNTER — Encounter: Payer: Self-pay | Admitting: Family Medicine

## 2021-02-01 ENCOUNTER — Ambulatory Visit (INDEPENDENT_AMBULATORY_CARE_PROVIDER_SITE_OTHER): Payer: 59 | Admitting: Family Medicine

## 2021-02-01 VITALS — BP 160/84 | HR 113 | Temp 97.8°F | Ht 63.0 in | Wt 131.6 lb

## 2021-02-01 DIAGNOSIS — I700 Atherosclerosis of aorta: Secondary | ICD-10-CM | POA: Insufficient documentation

## 2021-02-01 DIAGNOSIS — I10 Essential (primary) hypertension: Secondary | ICD-10-CM

## 2021-02-01 DIAGNOSIS — M5431 Sciatica, right side: Secondary | ICD-10-CM

## 2021-02-01 DIAGNOSIS — D509 Iron deficiency anemia, unspecified: Secondary | ICD-10-CM | POA: Diagnosis not present

## 2021-02-01 DIAGNOSIS — Z0000 Encounter for general adult medical examination without abnormal findings: Secondary | ICD-10-CM | POA: Diagnosis not present

## 2021-02-01 DIAGNOSIS — Z720 Tobacco use: Secondary | ICD-10-CM

## 2021-02-01 DIAGNOSIS — R0683 Snoring: Secondary | ICD-10-CM

## 2021-02-01 MED ORDER — KETOROLAC TROMETHAMINE 60 MG/2ML IM SOLN
60.0000 mg | Freq: Once | INTRAMUSCULAR | Status: AC
  Administered 2021-02-01: 60 mg via INTRAMUSCULAR

## 2021-02-01 MED ORDER — PREDNISONE 10 MG (48) PO TBPK: ORAL_TABLET | ORAL | 0 refills | Status: DC

## 2021-02-01 MED ORDER — GABAPENTIN 400 MG PO CAPS: 400.0000 mg | ORAL_CAPSULE | Freq: Every day | ORAL | 0 refills | Status: DC

## 2021-02-01 NOTE — Progress Notes (Signed)
Established Patient Office Visit  Subjective:  Patient ID: Valerie Russell, female    DOB: 04/20/1961  Age: 60 y.o. MRN: [id number]  CC:  Chief Complaint  Patient presents with   Annual Exam    CPE, would like referral to ortho for hip and lower back pains.     HPI [name] presents for for pain in her right hip area and lower back.  She received an injection for bursitis in her right hip a few weeks ago and says that it took the pain down from a 10 to a 4.  She is scheduled to return to orthopedics for evaluation of her pain in a few weeks.  She reports lower back pain and pain in her lateral right hip.  Patient is compliant with her lisinopril 20 mg daily.  She has been lost to follow-up for this.  She continues to smoke.  Tells me that she snores.  Her husband has witnessed apneic episodes.  She has seen a GYN doctor and diagnosed with HPV virus.  She has had a mammogram as well.  Up-to-date on her colonoscopy.  She is nonfasting today.  Past Medical History:  Diagnosis Date   Allergy    seasonal allergies   Anemia    on meds   Cervical cancer ([place])    in her 54s pt states, laser surgery to remove   COPD (chronic obstructive pulmonary disease) (HCC)    uses inhaler   Diverticulitis    GERD (gastroesophageal reflux disease)    on meds   Hypertension    on meds   Lung cancer ([place]) 2022   per pt (12/09/2020)   Seasonal allergies     Past Surgical History:  Procedure Laterality Date   APPENDECTOMY     BIOPSY  05/10/2020   Procedure: BIOPSY;  Surgeon: Lavena Bullion, DO;  Location: [facility];  Service: Gastroenterology;;   BIOPSY  12/23/2020   Procedure: BIOPSY;  Surgeon: Lavena Bullion, DO;  [facility];  Service: Gastroenterology;;   CESAREAN SECTION  05/04/1997   COLONOSCOPY WITH PROPOFOL N/A 05/10/2020   Procedure: COLONOSCOPY WITH PROPOFOL;  Surgeon: Lavena Bullion, DO;  [facility];  Service: Gastroenterology;  Laterality:  N/A;  colonoscopy with EMR   COLONOSCOPY WITH PROPOFOL N/A 12/23/2020   Procedure: COLONOSCOPY WITH PROPOFOL;  Surgeon: Lavena Bullion, DO;  [facility];  Service: Gastroenterology;  Laterality: N/A;   ENDOSCOPIC MUCOSAL RESECTION N/A 05/10/2020   Procedure: ENDOSCOPIC MUCOSAL RESECTION;  Surgeon: Lavena Bullion, DO;  [facility];  Service: Gastroenterology;  Laterality: N/A;   ESOPHAGOGASTRODUODENOSCOPY (EGD) WITH PROPOFOL N/A 05/10/2020   Procedure: ESOPHAGOGASTRODUODENOSCOPY (EGD) WITH PROPOFOL;  Surgeon: Lavena Bullion, DO;  [facility];  Service: Gastroenterology;  Laterality: N/A;   HEMOSTASIS CLIP PLACEMENT  05/10/2020   Procedure: HEMOSTASIS CLIP PLACEMENT;  Surgeon: Lavena Bullion, DO;  [facility];  Service: Gastroenterology;;   LASER ABLATION CONDYLOMA CERVICAL / VULVAR     POLYPECTOMY  12/23/2020   Procedure: POLYPECTOMY;  Surgeon: Lavena Bullion, DO;  [facility];  Service: Gastroenterology;;   SUBMUCOSAL LIFTING INJECTION  05/10/2020   Procedure: SUBMUCOSAL LIFTING INJECTION;  Surgeon: Lavena Bullion, DO;  [facility];  Service: Gastroenterology;;   WISDOM TOOTH EXTRACTION      Family History  Problem Relation Age of Onset   Cancer Other    Hypertension Mother    COPD Mother    Stroke Mother  Colon cancer Neg Hx    Colon polyps Neg Hx    Esophageal cancer Neg Hx    Rectal cancer Neg Hx    Stomach cancer Neg Hx     Social History   Socioeconomic History   Marital status: Single    Spouse name: Not on file   Number of children: Not on file   Years of education: Not on file   Highest education level: Not on file  Occupational History   Not on file  Tobacco Use   Smoking status: Every Day    Packs/day: 0.25    Years: 25.00    Pack years: 6.25    Types: Cigarettes   Smokeless tobacco: Never  Vaping Use   Vaping Use: Never used  Substance and Sexual Activity   Alcohol  use: Yes    Alcohol/week: 4.0 standard drinks    Types: 4 Standard drinks or equivalent per week    Comment: occasionally   Drug use: Not Currently    Types: Marijuana    Comment: sometimes   Sexual activity: Yes    Birth control/protection: None  Other Topics Concern   Not on file  Social History Narrative   Not on file   Social Determinants of Health   Financial Resource Strain: Not on file  Food Insecurity: Not on file  Transportation Needs: Not on file  Physical Activity: Not on file  Stress: Not on file  Social Connections: Not on file  Intimate Partner Violence: Not on file    Outpatient Medications Prior to Visit  Medication Sig Dispense Refill   albuterol (VENTOLIN HFA) 108 (90 Base) MCG/ACT inhaler Inhale 1-2 puffs into the lungs every 6 (six) hours as needed for wheezing or shortness of breath. 18 g 0   cetirizine (ZYRTEC) 10 MG tablet Take 10 mg by mouth daily.     esomeprazole (NEXIUM) 20 MG capsule Take 20 mg by mouth daily at 12 noon.     ferrous sulfate 325 (65 FE) MG tablet Take 325 mg by mouth daily with breakfast.     folic acid (FOLVITE) 1 MG tablet Take 1 mg by mouth daily.     lisinopril (ZESTRIL) 20 MG tablet Take 1 tablet by mouth once daily 90 tablet 0   Magnesium 500 MG TABS Take 500 mg by mouth daily.     Multiple Vitamin (MULTIVITAMIN PO) Take 1 tablet by mouth daily.     Tiotropium Bromide Monohydrate (SPIRIVA RESPIMAT) 2.5 MCG/ACT AERS Inhale 2 puffs into the lungs daily. (Patient taking differently: Inhale 2 puffs into the lungs daily as needed (SOB).) 4 g 0   No facility-administered medications prior to visit.    No Known Allergies  ROS Review of Systems  Constitutional:  Negative for chills, diaphoresis, fatigue, fever and unexpected weight change.  HENT: Negative.    Eyes:  Negative for photophobia and visual disturbance.  Respiratory:  Positive for apnea. Negative for cough and wheezing.   Cardiovascular: Negative.    Gastrointestinal: Negative.   Genitourinary:  Negative for difficulty urinating, dysuria, frequency and urgency.  Musculoskeletal:  Positive for arthralgias and back pain.  Neurological:  Negative for speech difficulty and weakness.  Psychiatric/Behavioral: Negative.       Objective:    Physical Exam Vitals and nursing note reviewed.  Constitutional:      General: She is not in acute distress.    Appearance: Normal appearance. She is normal weight. She is not ill-appearing, toxic-appearing or diaphoretic.  HENT:  Head: Normocephalic and atraumatic.     Right Ear: Tympanic membrane, ear canal and external ear normal.     Left Ear: Tympanic membrane, ear canal and external ear normal.     Mouth/Throat:     Mouth: Mucous membranes are dry.     Pharynx: Oropharynx is clear. No oropharyngeal exudate or posterior oropharyngeal erythema.  Eyes:     General: No scleral icterus.       Right eye: No discharge.        Left eye: No discharge.     Conjunctiva/sclera: Conjunctivae normal.     Pupils: Pupils are equal, round, and reactive to light.  Cardiovascular:     Rate and Rhythm: Normal rate and regular rhythm.  Pulmonary:     Effort: Pulmonary effort is normal.     Breath sounds: Normal breath sounds.  Abdominal:     General: Bowel sounds are normal.  Musculoskeletal:     Cervical back: No rigidity.     Lumbar back: No spasms, tenderness or bony tenderness.       Back:  Lymphadenopathy:     Cervical: No cervical adenopathy.  Skin:    General: Skin is warm and dry.  Neurological:     Mental Status: She is alert and oriented to person, place, and time.     Motor: No weakness.    BP (!) 160/84   Pulse (!) 113   Temp 97.8 F (36.6 C) (Temporal)   Ht 5\' 3"  (1.6 m)   Wt 131 lb 9.6 oz (59.7 kg)   SpO2 98%   BMI 23.31 kg/m  Wt Readings from Last 3 Encounters:  02/01/21 131 lb 9.6 oz (59.7 kg)  01/27/21 135 lb (61.2 kg)  12/23/20 135 lb (61.2 kg)     Health  Maintenance Due  Topic Date Due   Pneumococcal Vaccine 103-42 Years old (1 - PCV) Never done   HIV Screening  Never done   Hepatitis C Screening  Never done   PAP SMEAR-Modifier  Never done   MAMMOGRAM  Never done   Zoster Vaccines- Shingrix (1 of 2) Never done    There are no preventive care reminders to display for this patient.  No results found for: TSH Lab Results  Component Value Date   WBC 7.2 12/23/2019   HGB 9.9 (L) 12/23/2019   HCT 31.8 (L) 12/23/2019   MCV 76.7 (L) 12/23/2019   PLT 377.0 12/23/2019   Lab Results  Component Value Date   NA 140 12/23/2019   K 4.7 12/23/2019   CO2 26 12/23/2019   GLUCOSE 107 (H) 12/23/2019   BUN 12 12/23/2019   CREATININE 0.85 12/23/2019   BILITOT 0.3 12/23/2019   ALKPHOS 121 (H) 12/23/2019   AST 19 12/23/2019   ALT 17 12/23/2019   PROT 7.4 12/23/2019   ALBUMIN 4.4 12/23/2019   CALCIUM 9.5 12/23/2019   ANIONGAP 12 08/23/2015   GFR 68.33 12/23/2019   Lab Results  Component Value Date   CHOL 222 (H) 12/23/2019   Lab Results  Component Value Date   HDL 58.40 12/23/2019   Lab Results  Component Value Date   LDLCALC 142 (H) 12/23/2019   Lab Results  Component Value Date   TRIG 104.0 12/23/2019   Lab Results  Component Value Date   CHOLHDL 4 12/23/2019   No results found for: HGBA1C    Assessment & Plan:   Problem List Items Addressed This Visit       Cardiovascular and  Mediastinum   Essential hypertension   Relevant Orders   CBC   Comprehensive metabolic panel   Urinalysis, Routine w reflex microscopic   Aortic atherosclerosis (HCC)   Relevant Orders   Lipid panel     Nervous and Auditory   Sciatica of right side   Relevant Medications   predniSONE (STERAPRED UNI-PAK 48 TAB) 10 MG (48) TBPK tablet   gabapentin (NEURONTIN) 400 MG capsule     Other   Healthcare maintenance   Tobacco use   Iron deficiency anemia - Primary   Relevant Orders   CBC   Snores   Relevant Orders   Ambulatory referral  to Sleep Studies    Meds ordered this encounter  Medications   ketorolac (TORADOL) injection 60 mg   predniSONE (STERAPRED UNI-PAK 48 TAB) 10 MG (48) TBPK tablet    Sig: Pharm instruct dosing for a 12 day dose pack    Dispense:  48 tablet    Refill:  0   gabapentin (NEURONTIN) 400 MG capsule    Sig: Take 1 capsule (400 mg total) by mouth at bedtime.    Dispense:  30 capsule    Refill:  0    Follow-up: Return in about 1 month (around 03/04/2021), or return fasting for  labs..  Given information on health maintenance and disease prevention.  She will see her GYN doctor in the next month or so for follow-up of her Pap smear and mammogram.  Up-to-date on colonoscopy.  Blood pressure and pulse rate were elevated today.  Believe this could be secondary to the discomfort she is having with her sciatica.  Asked her to return in a month for recheck of both.  Elevated LDL cholesterol with calcified aorta seen on imaging study.  We will discuss using statin.  We will discuss smoking cessation.  Patient was given information on sciatica.  Agrees to go for sleep study for snoring.  We discussed prednisone side effects to include increased appetite and irritability.  She understands that the dosage will be decreasing over 12-day period.  Arm will instruct dosing.  She will use Neurontin at night.  She has follow-up scheduled with orthopedics in 1 week.  Libby Maw, MD

## 2021-02-04 ENCOUNTER — Other Ambulatory Visit: Payer: 59

## 2021-02-11 ENCOUNTER — Encounter: Payer: 59 | Admitting: Primary Care

## 2021-02-11 ENCOUNTER — Ambulatory Visit: Payer: 59

## 2021-02-14 ENCOUNTER — Other Ambulatory Visit: Payer: Self-pay | Admitting: *Deleted

## 2021-02-14 ENCOUNTER — Telehealth: Payer: Self-pay | Admitting: Primary Care

## 2021-02-14 DIAGNOSIS — F17210 Nicotine dependence, cigarettes, uncomplicated: Secondary | ICD-10-CM

## 2021-02-14 NOTE — Telephone Encounter (Signed)
Spoke with pt and rescheduled SDMV for 03/07/21 9:30. CT will be rescheduled.  Nothing further needed.

## 2021-02-17 ENCOUNTER — Other Ambulatory Visit: Payer: Self-pay

## 2021-02-17 ENCOUNTER — Encounter: Payer: Self-pay | Admitting: Surgery

## 2021-02-17 ENCOUNTER — Ambulatory Visit (INDEPENDENT_AMBULATORY_CARE_PROVIDER_SITE_OTHER): Payer: 59 | Admitting: Surgery

## 2021-02-17 VITALS — Ht 63.0 in | Wt 132.0 lb

## 2021-02-17 DIAGNOSIS — M5416 Radiculopathy, lumbar region: Secondary | ICD-10-CM

## 2021-02-17 DIAGNOSIS — S7401XA Injury of sciatic nerve at hip and thigh level, right leg, initial encounter: Secondary | ICD-10-CM

## 2021-02-17 NOTE — Progress Notes (Signed)
Office Visit Note   Patient: Valerie Russell           Date of Birth: Dec 25, 1960           MRN: [id number] Visit Date: 02/17/2021              Requested by: Libby Maw, MD [address] [place],  [place] [address] PCP: Libby Maw, MD   Assessment & Plan: Visit Diagnoses:  1. Radiculopathy, lumbar region   2. Injury of right sciatic nerve, initial encounter (IM injection at PCP February 01, 2021)     Plan: With patient's current symptoms I will go ahead and order lumbar MRI to rule out HNP/stenosis.  Patient will follow with Dr. Lorin Mercy to discuss results and further treatment options.  I am concerned that patient may have a possible right sciatic nerve injury from IM injection performed at her primary care office February 01, 2021.  We will see what Dr. Lorin Mercy thinks when patient returns.  Patient advised that if her symptoms worsen that she should contact our office.  Follow-Up Instructions: Return in about 2 weeks (around 03/03/2021) for with dr yates to review lumbar mri.   Orders:  Orders Placed This Encounter  Procedures   MR Lumbar Spine w/o contrast   No orders of the defined types were placed in this encounter.     Procedures: No procedures performed   Clinical Data: No additional findings.   Subjective: Chief Complaint  Patient presents with   Right Hip - Follow-up    HPI 60 year old white female returns for recheck.  Last office visit I performed right greater trochanter bursa injection.  States that this helped her lateral hip pain.  Patient was seen by her primary care provider Dr. Ethelene Hal February 01, 2021 and patient states that that provider told her that sciatica was causing her problem and he had his assistant performed a right buttock IM injection.  Since having that injection patient states that she feels like she "walks funny".  States that when she walks she feels like her knee and hip buckle on her.  She did not describe the symptoms when  I had seen her in the clinic January 27, 2021 and again reports that this started after the IM injection performed at PCP office. Review of Systems No current cardiac pulmonary GI GU issues  Objective: Vital Signs: Ht 5\' 3"  (1.6 m)   Wt 132 lb (59.9 kg)   BMI 23.38 kg/m   Physical Exam Pleasant white female alert and oriented in no acute distress.  Patient walks she does ambulate with the appearance of having some quad weakness.  She does have trace right hip flexor and quad weakness with resistance.  Positive right straight leg raise. Ortho Exam  Specialty Comments:  No specialty comments available.  Imaging: No results found.   PMFS History: Patient Active Problem List   Diagnosis Date Noted   Sciatica of right side 02/01/2021   Aortic atherosclerosis ([place]) 02/01/2021   Snores 02/01/2021   Multiple polyps of sigmoid colon    Grade II internal hemorrhoids    Acute bacterial sinusitis 10/29/2020   History of rectal polyps    Iron deficiency anemia    Diverticulosis of colon without hemorrhage    Rectal polyp    Iron deficiency 03/08/2020   Hematochezia 03/08/2020   Pulmonary emphysema ([place]) 03/08/2020   Microcytic anemia 01/28/2020   Tobacco use 01/28/2020   Elevated LDL cholesterol level 01/28/2020   Pain  in both lower extremities 01/28/2020   Dysthymia 01/28/2020   Essential hypertension 12/23/2019   Healthcare maintenance 12/23/2019   Reactive airway disease 12/23/2019   Past Medical History:  Diagnosis Date   Allergy    seasonal allergies   Anemia    on meds   Cervical cancer ([place])    in her 2s pt states, laser surgery to remove   COPD (chronic obstructive pulmonary disease) (HCC)    uses inhaler   Diverticulitis    GERD (gastroesophageal reflux disease)    on meds   Hypertension    on meds   Lung cancer ([place]) 2022   per pt (12/09/2020)   Seasonal allergies     Family History  Problem Relation Age of Onset   Cancer Other    Hypertension Mother     COPD Mother    Stroke Mother    Colon cancer Neg Hx    Colon polyps Neg Hx    Esophageal cancer Neg Hx    Rectal cancer Neg Hx    Stomach cancer Neg Hx     Past Surgical History:  Procedure Laterality Date   APPENDECTOMY     BIOPSY  05/10/2020   Procedure: BIOPSY;  Surgeon: Lavena Bullion, DO;  [facility];  Service: Gastroenterology;;   BIOPSY  12/23/2020   Procedure: BIOPSY;  Surgeon: Lavena Bullion, DO;  [facility];  Service: Gastroenterology;;   CESAREAN SECTION  05/04/1997   COLONOSCOPY WITH PROPOFOL N/A 05/10/2020   Procedure: COLONOSCOPY WITH PROPOFOL;  Surgeon: Lavena Bullion, DO;  [facility];  Service: Gastroenterology;  Laterality: N/A;  colonoscopy with EMR   COLONOSCOPY WITH PROPOFOL N/A 12/23/2020   Procedure: COLONOSCOPY WITH PROPOFOL;  Surgeon: Lavena Bullion, DO;  [facility];  Service: Gastroenterology;  Laterality: N/A;   ENDOSCOPIC MUCOSAL RESECTION N/A 05/10/2020   Procedure: ENDOSCOPIC MUCOSAL RESECTION;  Surgeon: Lavena Bullion, DO;  [facility];  Service: Gastroenterology;  Laterality: N/A;   ESOPHAGOGASTRODUODENOSCOPY (EGD) WITH PROPOFOL N/A 05/10/2020   Procedure: ESOPHAGOGASTRODUODENOSCOPY (EGD) WITH PROPOFOL;  Surgeon: Lavena Bullion, DO;  [facility];  Service: Gastroenterology;  Laterality: N/A;   HEMOSTASIS CLIP PLACEMENT  05/10/2020   Procedure: HEMOSTASIS CLIP PLACEMENT;  Surgeon: Lavena Bullion, DO;  [facility];  Service: Gastroenterology;;   LASER ABLATION CONDYLOMA CERVICAL / VULVAR     POLYPECTOMY  12/23/2020   Procedure: POLYPECTOMY;  Surgeon: Lavena Bullion, DO;  [facility];  Service: Gastroenterology;;   SUBMUCOSAL LIFTING INJECTION  05/10/2020   Procedure: SUBMUCOSAL LIFTING INJECTION;  Surgeon: Lavena Bullion, DO;  [facility];  Service: Gastroenterology;;   WISDOM TOOTH EXTRACTION     Social History    Occupational History   Not on file  Tobacco Use   Smoking status: Every Day    Packs/day: 0.25    Years: 25.00    Pack years: 6.25    Types: Cigarettes   Smokeless tobacco: Never  Vaping Use   Vaping Use: Never used  Substance and Sexual Activity   Alcohol use: Yes    Alcohol/week: 4.0 standard drinks    Types: 4 Standard drinks or equivalent per week    Comment: occasionally   Drug use: Not Currently    Types: Marijuana    Comment: sometimes   Sexual activity: Yes    Birth control/protection: None

## 2021-02-21 ENCOUNTER — Ambulatory Visit: Payer: 59 | Admitting: Gastroenterology

## 2021-03-03 ENCOUNTER — Encounter: Payer: Self-pay | Admitting: Surgery

## 2021-03-03 ENCOUNTER — Other Ambulatory Visit: Payer: Self-pay

## 2021-03-03 ENCOUNTER — Ambulatory Visit (INDEPENDENT_AMBULATORY_CARE_PROVIDER_SITE_OTHER): Payer: 59 | Admitting: Surgery

## 2021-03-03 DIAGNOSIS — M5416 Radiculopathy, lumbar region: Secondary | ICD-10-CM

## 2021-03-03 DIAGNOSIS — S7401XA Injury of sciatic nerve at hip and thigh level, right leg, initial encounter: Secondary | ICD-10-CM

## 2021-03-03 NOTE — Progress Notes (Signed)
60 year old white female returns for recheck of low back pain and lower extremity radicular symptoms.  Patient was supposed to follow-up with Dr. Lorin Mercy to review lumbar MRI but this is scheduled to be done August 11.  Symptoms unchanged.  I will have patient follow-up with Dr. Lorin Mercy March 15, 2021 for review of her study.  All questions answered.  Patient no charge for today's visit.

## 2021-03-04 ENCOUNTER — Ambulatory Visit: Payer: 59 | Admitting: Family Medicine

## 2021-03-07 ENCOUNTER — Other Ambulatory Visit: Payer: Self-pay

## 2021-03-07 ENCOUNTER — Telehealth (INDEPENDENT_AMBULATORY_CARE_PROVIDER_SITE_OTHER): Payer: 59 | Admitting: Acute Care

## 2021-03-07 ENCOUNTER — Ambulatory Visit
Admission: RE | Admit: 2021-03-07 | Discharge: 2021-03-07 | Disposition: A | Discharge status: Home / Self Care | Payer: 59 | Source: Ambulatory Visit | Attending: Acute Care | Admitting: Acute Care

## 2021-03-07 ENCOUNTER — Encounter: Payer: Self-pay | Admitting: Acute Care

## 2021-03-07 DIAGNOSIS — F17210 Nicotine dependence, cigarettes, uncomplicated: Secondary | ICD-10-CM | POA: Diagnosis not present

## 2021-03-07 DIAGNOSIS — Z87891 Personal history of nicotine dependence: Secondary | ICD-10-CM

## 2021-03-07 NOTE — Progress Notes (Signed)
Virtual Visit via Video Note  I connected with Lemar Lofty on 03/07/21 at  9:30 AM EDT by a video enabled telemedicine application and verified that I am speaking with the correct person using two identifiers.  Location: Patient: At home Provider: [facility], [place], [place], [address]    I discussed the limitations of evaluation and management by telemedicine and the availability of in person appointments. The patient expressed understanding and agreed to proceed.   Shared Decision Making Visit Lung Cancer Screening Program [phone])   Eligibility: Age 60 y.o. Pack Years Smoking History Calculation 34 pack year smoking history (# packs/per year x # years smoked) Recent History of coughing up blood  no Unexplained weight loss? no ( >Than 15 pounds within the last 6 months ) Prior History Lung / other cancer no (Diagnosis within the last 5 years already requiring surveillance chest CT Scans). Smoking Status Current Smoker Former Smokers: Years since quit: NA  Quit Date: NA  Visit Components: Discussion included one or more decision making aids. yes Discussion included risk/benefits of screening. yes Discussion included potential follow up diagnostic testing for abnormal scans. yes Discussion included meaning and risk of over diagnosis. yes Discussion included meaning and risk of False Positives. yes Discussion included meaning of total radiation exposure. yes  Counseling Included: Importance of adherence to annual lung cancer LDCT screening. yes Impact of comorbidities on ability to participate in the program. yes Ability and willingness to under diagnostic treatment. yes  Smoking Cessation Counseling: Current Smokers:  Discussed importance of smoking cessation. yes Information about tobacco cessation classes and interventions provided to patient. yes Patient provided with "ticket" for LDCT Scan. yes Symptomatic Patient. no  Counseling Diagnosis Code:  Tobacco Use Z72.0 Asymptomatic Patient yes  Counseling (Intermediate counseling: > three minutes counseling) K9326 Former Smokers:  Discussed the importance of maintaining cigarette abstinence. yes Diagnosis Code: Personal History of Nicotine Dependence. Z12.458 Information about tobacco cessation classes and interventions provided to patient. Yes Patient provided with "ticket" for LDCT Scan. yes Written Order for Lung Cancer Screening with LDCT placed in Epic. Yes (CT Chest Lung Cancer Screening Low Dose W/O CM) [id number] Z12.2-Screening of respiratory organs Z87.891-Personal history of nicotine dependence  I have spent 25 minutes of face to face time with Ms. Leaver discussing the risks and benefits of lung cancer screening. We viewed a power point together that explained in detail the above noted topics. We paused at intervals to allow for questions to be asked and answered to ensure understanding.We discussed that the single most powerful action that she can take to decrease her risk of developing lung cancer is to quit smoking. We discussed whether or not she is ready to commit to setting a quit date. We discussed options for tools to aid in quitting smoking including nicotine replacement therapy, non-nicotine medications, support groups, Quit Smart classes, and behavior modification. We discussed that often times setting smaller, more achievable goals, such as eliminating 1 cigarette a day for a week and then 2 cigarettes a day for a week can be helpful in slowly decreasing the number of cigarettes smoked. This allows for a sense of accomplishment as well as providing a clinical benefit. I gave her the " Be Stronger Than Your Excuses" card with contact information for community resources, classes, free nicotine replacement therapy, and access to mobile apps, text messaging, and on-line smoking cessation help. I have also given her my card and contact information in the event she needs to contact  me. We discussed the time and location of the scan, and that either Doroteo Glassman RN or I will call with the results within 24-48 hours of receiving them. I have offered her  a copy of the power point we viewed  as a resource in the event they need reinforcement of the concepts we discussed today in the office. The patient verbalized understanding of all of  the above and had no further questions upon leaving the office. They have my contact information in the event they have any further questions.  I spent 3-4 minutes counseling on smoking cessation and the health risks of continued tobacco abuse.  I explained to the patient that there has been a high incidence of coronary artery disease noted on these exams. I explained that this is a non-gated exam therefore degree or severity cannot be determined. This patient is not on statin therapy. I have asked the patient to follow-up with their PCP regarding any incidental finding of coronary artery disease and management with diet or medication as their PCP  feels is clinically indicated. The patient verbalized understanding of the above and had no further questions upon completion of the visit.      Magdalen Spatz, NP 03/07/2021

## 2021-03-09 ENCOUNTER — Encounter: Payer: Self-pay | Admitting: Family Medicine

## 2021-03-10 ENCOUNTER — Ambulatory Visit
Admission: RE | Admit: 2021-03-10 | Discharge: 2021-03-10 | Disposition: A | Discharge status: Home / Self Care | Payer: 59 | Source: Ambulatory Visit | Attending: Surgery | Admitting: Surgery

## 2021-03-10 ENCOUNTER — Other Ambulatory Visit: Payer: Self-pay

## 2021-03-10 DIAGNOSIS — M5416 Radiculopathy, lumbar region: Secondary | ICD-10-CM

## 2021-03-14 ENCOUNTER — Telehealth: Payer: Self-pay | Admitting: Acute Care

## 2021-03-14 DIAGNOSIS — F17210 Nicotine dependence, cigarettes, uncomplicated: Secondary | ICD-10-CM

## 2021-03-14 NOTE — Telephone Encounter (Signed)
Pt informed of CT results per Eric Form, NP.  PT verbalized understanding.  Copy of CT sent to PCP.  Order placed for 1 yr f/u CT.

## 2021-03-16 ENCOUNTER — Other Ambulatory Visit: Payer: Self-pay

## 2021-03-16 ENCOUNTER — Ambulatory Visit (INDEPENDENT_AMBULATORY_CARE_PROVIDER_SITE_OTHER): Payer: 59 | Admitting: Orthopaedic Surgery

## 2021-03-16 ENCOUNTER — Encounter: Payer: Self-pay | Admitting: Orthopaedic Surgery

## 2021-03-16 DIAGNOSIS — M5126 Other intervertebral disc displacement, lumbar region: Secondary | ICD-10-CM | POA: Diagnosis not present

## 2021-03-16 NOTE — Progress Notes (Signed)
Office Visit Note   Patient: Valerie Russell           Date of Birth: 1960-09-08           MRN: [id number] Visit Date: 03/16/2021              Requested by: Libby Maw, MD [address] [place],  [place] [address] PCP: Libby Maw, MD   Assessment & Plan: Visit Diagnoses:  1. Protrusion of lumbar intervertebral disc     Plan: Reviewed MRI with her and gave her copy of the report.  She has some small disc protrusions upper lumbar region with disc degeneration and loss of disc space height.  She can call if she would like to proceed with some physical therapy.  Plain radiographs show some calcification of the abdominal aorta and I recommended smoking cessation with aortic calcification present.  No indications for operative intervention at this point.  At times she is taken super therapeutic doses of Aleve and I discussed to twice a day with food should be the maximum dose.  She can return if she develops radicular symptoms which we discussed.  Follow-Up Instructions: No follow-ups on file.   Orders:  No orders of the defined types were placed in this encounter.  No orders of the defined types were placed in this encounter.     Procedures: No procedures performed   Clinical Data: No additional findings.   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  She has been treated for trochanteric bursitis in the past.  She is taking Aleve also used Biofreeze that seemed to work better than Aspercreme.  She has lung problems is trying to quit smoking but still smokes.  She states 1 time she was bent over and felt a pop could not get straight for 3 days.  She states a lung doctor told her that she may have long-term COVID.  She is applying for disability and  has an Forensic psychologist.  MRI scan has been obtained on 03/10/2021 when she is here for review of this.  No associated bowel bladder symptoms no fever or chills.  MRI scan shows multilevel small protrusions without significant compression involving T12-L1, L1-L2, L2-L3 and L4-L5.  The bottom L5-S1 level looks normal.  Patient been treated with prednisone Dosepak also on Neurontin.  Patient had an injury to her right foot she states she was afraid to go to the emergency room was concerned she may get COVID so no x-rays were obtained and she thinks she broke her foot at that time.  She still has some problems with her right foot since then.  Review of Systems positive for lung problems with ongoing smoking.  Hypertension.  She took her blood pressure medication today her BP still elevated at 191/103.   Objective: Vital Signs: BP (!) 191/103   Ht 5\' 3"  (1.6 m)   Wt 130 lb (59 kg)   BMI 23.03 kg/m   Physical Exam Constitutional:      Appearance: She is well-developed.  HENT:     Head: Normocephalic.     Right Ear: External ear normal.     Left Ear: External ear normal. There is no impacted cerumen.  Eyes:     Pupils: Pupils are  equal, round, and reactive to light.  Neck:     Thyroid: No thyromegaly.     Trachea: No tracheal deviation.  Cardiovascular:     Rate and Rhythm: Normal rate.  Pulmonary:     Effort: Pulmonary effort is normal.  Abdominal:     Palpations: Abdomen is soft.  Musculoskeletal:     Cervical back: No rigidity.  Skin:    General: Skin is warm and dry.  Neurological:     Mental Status: She is alert and oriented to person, place, and time.  Psychiatric:        Behavior: Behavior normal.    Ortho Exam patient is able to toe walk she has trouble heel walking on the right foot.  Anterior tib EHL is strong and intact no atrophy calf or anterior or peroneal compartment right or left.  1+ palpable pulses.  Negative logroll of the hips minimal tenderness right trochanteric bursa.   Mild sciatic notch tenderness.  Negative straight leg raising 90 degrees.  Specialty Comments:  No specialty comments available.  Imaging: No results found.CLINICAL DATA:  Lumbar radiculopathy. Back pain and right hip and leg pain.   EXAM: MRI LUMBAR SPINE WITHOUT CONTRAST   TECHNIQUE: Multiplanar, multisequence MR imaging of the lumbar spine was performed. No intravenous contrast was administered.   COMPARISON:  Lumbar spine radiographs 01/27/2021   FINDINGS: Segmentation:  Normal   Alignment: Mild retrolisthesis L1-2 and L2-3. Mild anterolisthesis L3-4   Vertebrae: Normal bone marrow. Mild chronic compression fracture T12. No acute fracture or mass.   Conus medullaris and cauda equina: Conus extends to the L1-2 level. Conus and cauda equina appear normal.   Paraspinal and other soft tissues: Negative for paraspinous mass or adenopathy.   Disc levels:   T12-L1: Left paracentral disc protrusion. This is causing subarticular stenosis on the left with possible left L1 nerve root impingement. Diffuse disc bulging. Spinal canal adequate in size.   L1-2: Disc degeneration with diffuse endplate spurring. Left foraminal disc protrusion causing subarticular foraminal stenosis on the left. Spinal canal adequate in size. Mild facet degeneration.   L2-3: Right paracentral disc protrusion causing subarticular and foraminal stenosis on the right. Bilateral facet hypertrophy with mild spinal stenosis. Mild to moderate subarticular and foraminal stenosis also on the left.   L3-4: Moderate facet degeneration bilaterally. Negative for stenosis   L4-5: Small right paracentral disc protrusion. Mild facet degeneration. No significant neural impingement   L5-S1: Negative   IMPRESSION: Left paracentral disc protrusion T12-L1   Left foraminal disc protrusion and spurring L1-2   Right paracentral disc protrusion L2-3   Small right paracentral disc protrusion L4-5.      Electronically Signed   By: Franchot Gallo M.D.   On: 03/10/2021 11:22   PMFS History: Patient Active Problem List   Diagnosis Date Noted   Protrusion of lumbar intervertebral disc 03/16/2021   Aortic atherosclerosis ([place]) 02/01/2021   Snores 02/01/2021   Multiple polyps of sigmoid colon    Grade II internal hemorrhoids    Acute bacterial sinusitis 10/29/2020   History of rectal polyps    Iron deficiency anemia    Diverticulosis of colon without hemorrhage    Rectal polyp    Iron deficiency 03/08/2020   Hematochezia 03/08/2020   Pulmonary emphysema ([place]) 03/08/2020   Microcytic anemia 01/28/2020   Tobacco use 01/28/2020   Elevated LDL cholesterol level 01/28/2020   Pain in both lower extremities 01/28/2020   Dysthymia 01/28/2020   Essential hypertension 12/23/2019  Healthcare maintenance 12/23/2019   Reactive airway disease 12/23/2019   Past Medical History:  Diagnosis Date   Allergy    seasonal allergies   Anemia    on meds   Cervical cancer ([place])    in her 25s pt states, laser surgery to remove   COPD (chronic obstructive pulmonary disease) ([facility])    uses inhaler   Diverticulitis    GERD (gastroesophageal reflux disease)    on meds   Hypertension    on meds   Lung cancer ([place]) 2022   per pt (12/09/2020)   Seasonal allergies     Family History  Problem Relation Age of Onset   Cancer Other    Hypertension Mother    COPD Mother    Stroke Mother    Colon cancer Neg Hx    Colon polyps Neg Hx    Esophageal cancer Neg Hx    Rectal cancer Neg Hx    Stomach cancer Neg Hx     Past Surgical History:  Procedure Laterality Date   APPENDECTOMY     BIOPSY  05/10/2020   Procedure: BIOPSY;  Surgeon: Lavena Bullion, DO;  Location: [facility];  Service: Gastroenterology;;   BIOPSY  12/23/2020   Procedure: BIOPSY;  Surgeon: Lavena Bullion, DO;  [facility];  Service: Gastroenterology;;   CESAREAN SECTION  05/04/1997   COLONOSCOPY WITH PROPOFOL  N/A 05/10/2020   Procedure: COLONOSCOPY WITH PROPOFOL;  Surgeon: Lavena Bullion, DO;  [facility];  Service: Gastroenterology;  Laterality: N/A;  colonoscopy with EMR   COLONOSCOPY WITH PROPOFOL N/A 12/23/2020   Procedure: COLONOSCOPY WITH PROPOFOL;  Surgeon: Lavena Bullion, DO;  [facility];  Service: Gastroenterology;  Laterality: N/A;   ENDOSCOPIC MUCOSAL RESECTION N/A 05/10/2020   Procedure: ENDOSCOPIC MUCOSAL RESECTION;  Surgeon: Lavena Bullion, DO;  [facility];  Service: Gastroenterology;  Laterality: N/A;   ESOPHAGOGASTRODUODENOSCOPY (EGD) WITH PROPOFOL N/A 05/10/2020   Procedure: ESOPHAGOGASTRODUODENOSCOPY (EGD) WITH PROPOFOL;  Surgeon: Lavena Bullion, DO;  [facility];  Service: Gastroenterology;  Laterality: N/A;   HEMOSTASIS CLIP PLACEMENT  05/10/2020   Procedure: HEMOSTASIS CLIP PLACEMENT;  Surgeon: Lavena Bullion, DO;  [facility];  Service: Gastroenterology;;   LASER ABLATION CONDYLOMA CERVICAL / VULVAR     POLYPECTOMY  12/23/2020   Procedure: POLYPECTOMY;  Surgeon: Lavena Bullion, DO;  [facility];  Service: Gastroenterology;;   SUBMUCOSAL LIFTING INJECTION  05/10/2020   Procedure: SUBMUCOSAL LIFTING INJECTION;  Surgeon: Lavena Bullion, DO;  [facility];  Service: Gastroenterology;;   WISDOM TOOTH EXTRACTION     Social History   Occupational History   Not on file  Tobacco Use   Smoking status: Every Day    Packs/day: 0.25    Years: 25.00    Pack years: 6.25    Types: Cigarettes   Smokeless tobacco: Never  Vaping Use   Vaping Use: Never used  Substance and Sexual Activity   Alcohol use: Yes    Alcohol/week: 4.0 standard drinks    Types: 4 Standard drinks or equivalent per week    Comment: occasionally   Drug use: Not Currently    Types: Marijuana    Comment: sometimes   Sexual activity: Yes    Birth control/protection: None

## 2021-03-17 NOTE — Progress Notes (Signed)
Please call patient and let them  know their  low dose Ct was read as a Lung RADS 2: nodules that are benign in appearance and behavior with a very low likelihood of becoming a clinically active cancer due to size or lack of growth. Recommendation per radiology is for a repeat LDCT in 12 months. .Please let them  know we will order and schedule their  annual screening scan for 02/2022. Please let them  know there was notation of CAD on their  scan.  Please remind the patient  that this is a non-gated exam therefore degree or severity of disease  cannot be determined. Please have them  follow up with their PCP regarding potential risk factor modification, dietary therapy or pharmacologic therapy if clinically indicated. Pt.  is not  currently on statin therapy. Please place order for annual  screening scan for  02/2022 and fax results to PCP. Thanks so much.  This patient has 3 vessel CAD, and is not on statin therapy that I cn see in Epic. Please have them follow up with PCP. Thanks so much

## 2021-04-12 ENCOUNTER — Institutional Professional Consult (permissible substitution): Payer: 59 | Admitting: Neurology

## 2021-07-06 ENCOUNTER — Ambulatory Visit: Payer: 59 | Admitting: Surgery

## 2021-08-08 ENCOUNTER — Encounter: Payer: Self-pay | Admitting: Family Medicine

## 2021-08-08 ENCOUNTER — Telehealth: Payer: Self-pay | Admitting: Family Medicine

## 2021-08-08 NOTE — Telephone Encounter (Signed)
No show 03/04/21 2nd no show, letter mailed & generated $50 no show fee KO

## 2021-09-01 ENCOUNTER — Other Ambulatory Visit: Payer: Self-pay

## 2021-09-01 ENCOUNTER — Encounter: Payer: Self-pay | Admitting: Surgery

## 2021-09-01 ENCOUNTER — Ambulatory Visit (INDEPENDENT_AMBULATORY_CARE_PROVIDER_SITE_OTHER): Payer: 59 | Admitting: Surgery

## 2021-09-01 VITALS — BP 159/116 | HR 118 | Ht 63.0 in | Wt 130.0 lb

## 2021-09-01 DIAGNOSIS — M461 Sacroiliitis, not elsewhere classified: Secondary | ICD-10-CM

## 2021-09-01 DIAGNOSIS — M5416 Radiculopathy, lumbar region: Secondary | ICD-10-CM

## 2021-09-01 NOTE — Progress Notes (Signed)
Office Visit Note   Patient: Valerie Russell           Date of Birth: 13-Jun-1961           MRN: [id number] Visit Date: 09/01/2021              Requested by: Libby Maw, MD [address] [place],  [place] [address] PCP: Libby Maw, MD   Assessment & Plan: Visit Diagnoses:  1. Radiculopathy, lumbar region   2. RIGHT SI (sacroiliac) joint inflammation ([place])     Plan: I again reviewed patient's previous lumbar MRI with her today.  I will schedule her consult with Dr. Ernestina Patches to discuss possible lumbar ESI's and right SI joint diagnostic/therapeutic injection.  I will have patient follow-up with Dr. Lorin Mercy in 5 weeks for recheck hopefully she has had injections by then.  All questions answered.  Regards to patient's worsening constipation that she has described I advised her that she should follow-up with her primary care physician or go directly to the emergency department.  She mentioned that she is no longer with her previous PCP and she will go to the emergency department for evaluation.  Follow-Up Instructions: Return in about 5 weeks (around 10/06/2021) for with dr yates recheck after appointment with [facility].   Orders:  Orders Placed This Encounter  Procedures   Ambulatory referral to Physical Medicine Rehab   No orders of the defined types were placed in this encounter.     Procedures: No procedures performed   Clinical Data: No additional findings.   Subjective: Chief Complaint  Patient presents with   Lower Back - Pain    HPI 61 year old white female returns with complaints of right buttock pain and pain around the right SI joint.  Patient has been seen by me for these issues in the past and I had previously performed a right hip greater trochanter Marcaine/Depo-Medrol injection with excellent relief.  She did have a lumbar MRI scan March 10, 2021 which showed:  CLINICAL DATA:  Lumbar radiculopathy. Back pain and right hip and leg  pain.   EXAM: MRI LUMBAR SPINE WITHOUT CONTRAST   TECHNIQUE: Multiplanar, multisequence MR imaging of the lumbar spine was performed. No intravenous contrast was administered.   COMPARISON:  Lumbar spine radiographs 01/27/2021   FINDINGS: Segmentation:  Normal   Alignment: Mild retrolisthesis L1-2 and L2-3. Mild anterolisthesis L3-4   Vertebrae: Normal bone marrow. Mild chronic compression fracture T12. No acute fracture or mass.   Conus medullaris and cauda equina: Conus extends to the L1-2 level. Conus and cauda equina appear normal.   Paraspinal and other soft tissues: Negative for paraspinous mass or adenopathy.   Disc levels:   T12-L1: Left paracentral disc protrusion. This is causing subarticular stenosis on the left with possible left L1 nerve root impingement. Diffuse disc bulging. Spinal canal adequate in size.   L1-2: Disc degeneration with diffuse endplate spurring. Left foraminal disc protrusion causing subarticular foraminal stenosis on the left. Spinal canal adequate in size. Mild facet degeneration.   L2-3: Right paracentral disc protrusion causing subarticular and foraminal stenosis on the right. Bilateral facet hypertrophy with mild spinal stenosis. Mild to moderate subarticular and foraminal stenosis also on the left.   L3-4: Moderate facet degeneration bilaterally. Negative for stenosis   L4-5: Small right paracentral disc protrusion. Mild facet degeneration. No significant neural impingement   L5-S1: Negative   IMPRESSION: Left paracentral disc protrusion T12-L1   Left foraminal disc protrusion and spurring L1-2  Right paracentral disc protrusion L2-3   Small right paracentral disc protrusion L4-5.     Electronically Signed   By: Franchot Gallo M.D.   On: 03/10/2021 11:22    Patient follow-up with Dr. Lorin Mercy for this but he did not think surgical intervention was needed.  Patient states that she has had a flareup of her pain over the  last couple weeks.  No injury.  Pain when she is sitting, walking standing or lying down.  No pain radiating down her leg.  States that she is also had some issues with constipation and when she is on the toilet straining this does aggravate her back issues.  She has used over-the-counter medications for this including an enema without any improvement.  She does not currently have a primary care provider.  She has not been to the ED for this.   Review of Systems Patient admits issues with constipation.  No complaints of fever chills nausea vomiting pulmonary issues.  Objective: Vital Signs: BP (!) 159/116    Pulse (!) 118    Ht 5\' 3"  (1.6 m)    Wt 130 lb (59 kg)    BMI 23.03 kg/m   Physical Exam HENT:     Head: Normocephalic.     Nose: Nose normal.  Eyes:     Extraocular Movements: Extraocular movements intact.  Pulmonary:     Effort: No respiratory distress.  Musculoskeletal:     Comments: Gait is somewhat antalgic.Marland Kitchen  Patient has moderate to marked tenderness over the right SI joint and some tenderness over the right sciatic notch.  Negative logroll bilateral hips.  Negative straight leg raise.  No focal motor deficits.  Neurological:     Mental Status: She is alert and oriented to person, place, and time.  Psychiatric:        Mood and Affect: Mood normal.    Ortho Exam  Specialty Comments:  No specialty comments available.  Imaging: No results found.   PMFS History: Patient Active Problem List   Diagnosis Date Noted   Protrusion of lumbar intervertebral disc 03/16/2021   Aortic atherosclerosis ([place]) 02/01/2021   Snores 02/01/2021   Multiple polyps of sigmoid colon    Grade II internal hemorrhoids    Acute bacterial sinusitis 10/29/2020   History of rectal polyps    Iron deficiency anemia    Diverticulosis of colon without hemorrhage    Rectal polyp    Iron deficiency 03/08/2020   Hematochezia 03/08/2020   Pulmonary emphysema ([place]) 03/08/2020   Microcytic anemia  01/28/2020   Tobacco use 01/28/2020   Elevated LDL cholesterol level 01/28/2020   Pain in both lower extremities 01/28/2020   Dysthymia 01/28/2020   Essential hypertension 12/23/2019   Healthcare maintenance 12/23/2019   Reactive airway disease 12/23/2019   Past Medical History:  Diagnosis Date   Allergy    seasonal allergies   Anemia    on meds   Cervical cancer ([facility])    in her 64s pt states, laser surgery to remove   COPD (chronic obstructive pulmonary disease) (HCC)    uses inhaler   Diverticulitis    GERD (gastroesophageal reflux disease)    on meds   Hypertension    on meds   Lung cancer ([place]) 2022   per pt (12/09/2020)   Seasonal allergies     Family History  Problem Relation Age of Onset   Cancer Other    Hypertension Mother    COPD Mother    Stroke Mother  Colon cancer Neg Hx    Colon polyps Neg Hx    Esophageal cancer Neg Hx    Rectal cancer Neg Hx    Stomach cancer Neg Hx     Past Surgical History:  Procedure Laterality Date   APPENDECTOMY     BIOPSY  05/10/2020   Procedure: BIOPSY;  Surgeon: Lavena Bullion, DO;  [facility];  Service: Gastroenterology;;   BIOPSY  12/23/2020   Procedure: BIOPSY;  Surgeon: Lavena Bullion, DO;  [facility];  Service: Gastroenterology;;   CESAREAN SECTION  05/04/1997   COLONOSCOPY WITH PROPOFOL N/A 05/10/2020   Procedure: COLONOSCOPY WITH PROPOFOL;  Surgeon: Lavena Bullion, DO;  [facility];  Service: Gastroenterology;  Laterality: N/A;  colonoscopy with EMR   COLONOSCOPY WITH PROPOFOL N/A 12/23/2020   Procedure: COLONOSCOPY WITH PROPOFOL;  Surgeon: Lavena Bullion, DO;  [facility];  Service: Gastroenterology;  Laterality: N/A;   ENDOSCOPIC MUCOSAL RESECTION N/A 05/10/2020   Procedure: ENDOSCOPIC MUCOSAL RESECTION;  Surgeon: Lavena Bullion, DO;  [facility];  Service: Gastroenterology;  Laterality: N/A;   ESOPHAGOGASTRODUODENOSCOPY (EGD) WITH  PROPOFOL N/A 05/10/2020   Procedure: ESOPHAGOGASTRODUODENOSCOPY (EGD) WITH PROPOFOL;  Surgeon: Lavena Bullion, DO;  [facility];  Service: Gastroenterology;  Laterality: N/A;   HEMOSTASIS CLIP PLACEMENT  05/10/2020   Procedure: HEMOSTASIS CLIP PLACEMENT;  Surgeon: Lavena Bullion, DO;  [facility];  Service: Gastroenterology;;   LASER ABLATION CONDYLOMA CERVICAL / VULVAR     POLYPECTOMY  12/23/2020   Procedure: POLYPECTOMY;  Surgeon: Lavena Bullion, DO;  [facility];  Service: Gastroenterology;;   SUBMUCOSAL LIFTING INJECTION  05/10/2020   Procedure: SUBMUCOSAL LIFTING INJECTION;  Surgeon: Lavena Bullion, DO;  [facility];  Service: Gastroenterology;;   WISDOM TOOTH EXTRACTION     Social History   Occupational History   Not on file  Tobacco Use   Smoking status: Every Day    Packs/day: 0.25    Years: 25.00    Pack years: 6.25    Types: Cigarettes   Smokeless tobacco: Never  Vaping Use   Vaping Use: Never used  Substance and Sexual Activity   Alcohol use: Yes    Alcohol/week: 4.0 standard drinks    Types: 4 Standard drinks or equivalent per week    Comment: occasionally   Drug use: Not Currently    Types: Marijuana    Comment: sometimes   Sexual activity: Yes    Birth control/protection: None

## 2021-09-06 ENCOUNTER — Telehealth: Payer: Self-pay | Admitting: Physical Medicine and Rehabilitation

## 2021-09-06 ENCOUNTER — Ambulatory Visit: Payer: PRIVATE HEALTH INSURANCE | Admitting: Physical Medicine and Rehabilitation

## 2021-09-06 NOTE — Telephone Encounter (Signed)
Patient called. She would like to [facility] her appointment with Dr. Ernestina Patches. She is sick.

## 2021-09-09 ENCOUNTER — Telehealth: Payer: Self-pay | Admitting: Gastroenterology

## 2021-09-09 ENCOUNTER — Ambulatory Visit: Payer: PRIVATE HEALTH INSURANCE | Admitting: Gastroenterology

## 2021-09-09 NOTE — Telephone Encounter (Signed)
Good Morning Dr. Bryan Lemma,   Valerie Russell called to cancel appointment with you this morning at 9:00 due to waking up with a fever and not feeling well.  Valerie Russell was rescheduled for 2/27 at 11:00.

## 2021-09-12 ENCOUNTER — Ambulatory Visit: Payer: PRIVATE HEALTH INSURANCE | Admitting: Physical Medicine and Rehabilitation

## 2021-09-12 ENCOUNTER — Telehealth: Payer: Self-pay | Admitting: Physical Medicine and Rehabilitation

## 2021-09-12 NOTE — Telephone Encounter (Signed)
Pt calling to resch an appt with Dr. Ernestina Patches that was for  9am today. The best call back number is [phone]

## 2021-09-19 ENCOUNTER — Ambulatory Visit: Payer: PRIVATE HEALTH INSURANCE | Admitting: Physical Medicine and Rehabilitation

## 2021-09-26 ENCOUNTER — Other Ambulatory Visit: Payer: Self-pay

## 2021-09-26 ENCOUNTER — Encounter: Payer: Self-pay | Admitting: Gastroenterology

## 2021-09-26 ENCOUNTER — Other Ambulatory Visit (INDEPENDENT_AMBULATORY_CARE_PROVIDER_SITE_OTHER): Payer: 59

## 2021-09-26 ENCOUNTER — Ambulatory Visit (INDEPENDENT_AMBULATORY_CARE_PROVIDER_SITE_OTHER): Payer: 59 | Admitting: Gastroenterology

## 2021-09-26 VITALS — BP 110/68 | HR 89 | Ht 63.0 in | Wt 133.0 lb

## 2021-09-26 DIAGNOSIS — K649 Unspecified hemorrhoids: Secondary | ICD-10-CM | POA: Diagnosis not present

## 2021-09-26 DIAGNOSIS — K5900 Constipation, unspecified: Secondary | ICD-10-CM

## 2021-09-26 DIAGNOSIS — K641 Second degree hemorrhoids: Secondary | ICD-10-CM | POA: Diagnosis not present

## 2021-09-26 DIAGNOSIS — D509 Iron deficiency anemia, unspecified: Secondary | ICD-10-CM

## 2021-09-26 LAB — IRON, TOTAL/TOTAL IRON BINDING CAP
%SAT: 4 % (calc) — ABNORMAL LOW (ref 16–45)
Iron: 19 ug/dL — ABNORMAL LOW (ref 45–160)
TIBC: 476 mcg/dL (calc) — ABNORMAL HIGH (ref 250–450)

## 2021-09-26 LAB — CBC
HCT: 27.6 % — ABNORMAL LOW (ref 36.0–46.0)
Hemoglobin: 8.8 g/dL — ABNORMAL LOW (ref 12.0–15.0)
MCHC: 31.8 g/dL (ref 30.0–36.0)
MCV: 79.8 fl (ref 78.0–100.0)
Platelets: 349 10*3/uL (ref 150.0–400.0)
RBC: 3.46 Mil/uL — ABNORMAL LOW (ref 3.87–5.11)
RDW: 17.3 % — ABNORMAL HIGH (ref 11.5–15.5)
WBC: 5.9 10*3/uL (ref 4.0–10.5)

## 2021-09-26 NOTE — Patient Instructions (Addendum)
If you are age 62 or older, your body mass index should be between 23-30. Your Body mass index is 23.56 kg/m. If this is out of the aforementioned range listed, please consider follow up with your Primary Care Provider.  If you are age 27 or younger, your body mass index should be between 19-25. Your Body mass index is 23.56 kg/m. If this is out of the aformentioned range listed, please consider follow up with your Primary Care Provider.   ________________________________________________________  The [facility] providers would like to encourage you to use [facility] to communicate with providers for non-urgent requests or questions.  Due to long hold times on the telephone, sending your provider a message by [facility] may be a faster and more efficient way to get a response.  Please allow 48 business hours for a response.  Please remember that this is for non-urgent requests.  _______________________________________________________   Please go to the lab on the 2nd floor [address] before you leave the office today.   Second hemorrhoid banding 10-25-2021 at [facility]   The procedure you have had should have been relatively painless since the banding of the area involved does not have nerve endings and there is no pain sensation.  The rubber band cuts off the blood supply to the hemorrhoid and the band may fall off as soon as 48 hours after the banding (the band may occasionally be seen in the toilet bowl following a bowel movement). You may notice a temporary feeling of fullness in the rectum which should respond adequately to plain Tylenol or Motrin.  Following the banding, avoid strenuous exercise that evening and resume full activity the next day.  A sitz bath (soaking in a warm tub) or bidet is soothing, and can be useful for cleansing the area after bowel movements.     To avoid constipation, take two tablespoons of natural wheat bran, natural oat  bran, flax, Benefiber or any over the counter fiber supplement and increase your water intake to 7-8 glasses daily.    Unless you have been prescribed anorectal medication, do not put anything inside your rectum for two weeks: No suppositories, enemas, fingers, etc.  Occasionally, you may have more bleeding than usual after the banding procedure.  This is often from the untreated hemorrhoids rather than the treated one.  Dont be concerned if there is a tablespoon or so of blood.  If there is more blood than this, lie flat with your bottom higher than your head and apply an ice pack to the area. If the bleeding does not stop within a half an hour or if you feel faint, call our office at [phone] or go to the emergency room.  Problems are not common; however, if there is a substantial amount of bleeding, severe pain, chills, fever or difficulty passing urine (very rare) or other problems, you should call us at [phone] or report to the nearest emergency room.  Do not stay seated continuously for more than 2-3 hours for a day or two after the procedure.  Tighten your buttock muscles 10-15 times every two hours and take 10-15 deep breaths every 1-2 hours.  Do not spend more than a few minutes on the toilet if you cannot empty your bowel; instead re-visit the toilet at a later time.  It was a pleasure to see you today!  Vito Cirigliano, D.O.

## 2021-09-26 NOTE — Progress Notes (Signed)
Chief Complaint:    Symptomatic Internal Hemorrhoids; Hemorrhoid Band Ligation, constipation, hematochezia  GI History: 61 year old female with symptomatic hemorrhoids (intermittent BRBPR, rectal itching/irritation)  Endoscopic History: - Colonoscopy (02/2020): 3 subcentimeter adenomas in the descending/ascending colon, 8 mm TA in sigmoid, 6 smaller sigmoid hyperplastic polyps.  25 mm polyp in the proximal rectum not resected in favor of EMR at the hospital.  Single small cecal AVM.  Internal hemorrhoids, sigmoid diverticulosis.  Normal TI - Colonoscopy (04/2020): 30 mm tubular adenoma with focal high grade dysplasia via piecemeal technique EMR.  Sigmoid diverticulosis - EGD (04/2020): 3 cm HH, otherwise normal - Colonoscopy (11/2020): Small cecal AVM, 4 sigmoid hyperplastic polyps ranging 1-3 mm (path:), 3 benign rectal hyperplastic polyps.  Post polypectomy scar in rectum (biopsied: Normal, benign tissue).  Sigmoid diverticulosis, medium sized grade 2 internal hemorrhoids.  Repeat in 3 years  HPI:     Patient is a 61 y.o. femalewith a history of symptomatic internal hemorrhoids presenting to the [facility]astroenterology Clinic for follow-up and ongoing treatment. The patient presents with symptomatic grade 2 hemorrhoids, unresponsive to maximal medical therapy, requesting rubber band ligation of symptomatic hemorrhoidal disease.  Separately, she has had constipation over the last couple weeks with straining and decreased stool frequency. Started OTC laxative with some improvement. Has been taking iron for the last year, and did notice some improvement when she held that temporarily.  Is back to taking the iron again.  She does have a history of iron deficiency anemia which was evaluated with EGD/colonoscopy in 04/2020.  No repeat labs since 11/2019.   Review of systems:     No chest pain, no SOB, no fevers, no urinary sx   Past Medical History:  Diagnosis Date   Allergy    seasonal allergies    Anemia    on meds   Cervical cancer ([place])    in her 22s pt states, laser surgery to remove   COPD (chronic obstructive pulmonary disease) (HCC)    uses inhaler   Diverticulitis    GERD (gastroesophageal reflux disease)    on meds   Hypertension    on meds   Lung cancer ([place]) 2022   per pt (12/09/2020)   Seasonal allergies     Patient's surgical history, family medical history, social history, medications and allergies were all reviewed in Epic    Current Outpatient Medications  Medication Sig Dispense Refill   albuterol (VENTOLIN HFA) 108 (90 Base) MCG/ACT inhaler Inhale 1-2 puffs into the lungs every 6 (six) hours as needed for wheezing or shortness of breath. 18 g 0   cetirizine (ZYRTEC) 10 MG tablet Take 10 mg by mouth daily.     esomeprazole (NEXIUM) 20 MG capsule Take 20 mg by mouth daily at 12 noon.     ferrous sulfate 325 (65 FE) MG tablet Take 325 mg by mouth daily with breakfast.     folic acid (FOLVITE) 1 MG tablet Take 1 mg by mouth daily.     lisinopril (ZESTRIL) 20 MG tablet Take 1 tablet by mouth once daily 90 tablet 0   Magnesium 500 MG TABS Take 500 mg by mouth daily.     Multiple Vitamin (MULTIVITAMIN PO) Take 1 tablet by mouth daily.     Tiotropium Bromide Monohydrate (SPIRIVA RESPIMAT) 2.5 MCG/ACT AERS Inhale 2 puffs into the lungs daily. (Patient taking differently: Inhale 2 puffs into the lungs daily as needed (SOB).) 4 g 0   No current facility-administered medications for this visit.  Physical Exam:     BP 110/68    Pulse 89    Ht 5\' 3"  (1.6 m)    Wt 133 lb (60.3 kg)    SpO2 97%    BMI 23.56 kg/m   GENERAL:  Pleasant female in NAD PSYCH: : Cooperative, normal affect ABDOMEN:  Nondistended, soft, nontender. No obvious masses, no hepatomegaly,  normal bowel sounds NEURO: Alert and oriented x 3, no focal neurologic deficits Rectal exam: Sensation intact and preserved anal wink.  Small external skin tags.  Grade 2 hemorrhoids noted in all positions on  anoscopy.  No external anal fissures noted. Normal sphincter tone. No palpable mass. No blood on the exam glove. (Chaperone: Renee Rival, CMA).   IMPRESSION and PLAN:    #1.  Symptomatic internal hemorrhoids: PROCEDURE NOTE: The patient presents with symptomatic grade 2 hemorrhoids, unresponsive to maximal medical therapy, requesting rubber band ligation of symptomatic hemorrhoidal disease.  All risks, benefits and alternative forms of therapy were described and informed consent was obtained.  In the Left Lateral Decubitus position, anoscopic examination revealed grade 2 hemorrhoids in the all position(s).  The anorectum was pre-medicated with RectiCare. The decision was made to band the LL internal hemorrhoid, and the [place] was used to perform band ligation without complication.  Digital anorectal examination was then performed to assure proper positioning of the band, and to adjust the banded tissue as required.  The patient was discharged home without pain or other issues.  Dietary and behavioral recommendations were given and along with follow-up instructions.     The following adjunctive treatments were recommended:  - Start high-fiber diet with fiber supplement (i.e. Citrucel or Benefiber) with goal for soft stools without straining to have a BM. -Resume adequate fluid intake.  The patient will return in 4 for  follow-up and possible additional banding as required. No complications were encountered and the patient tolerated the procedure well.     #2.  Iron deficiency anemia - EGD and colonoscopy completed in 04/2020 with another colonoscopy in 11/2020 (for polyp surveillance).  No CBC checked since 11/2019. - Recheck CBC, ferritin, iron panel. If still iron deficient, will refer for IV iron and plan for VCE  #3. Constipation - Recent onset constipation in the setting of oral iron therapy.  Has been taking oral iron for over a year - Increase water consumption to at  least 64 oz/day - Start fiber supplement - Increase dietary fiber - If continued sxs, will hold iron and eval for response. If improved and still needing iron, will plan for alternate formulation, QOD dosing, or IV iron - Continue OTC laxative. Depending on above, if needed will add Miralax 1 cap/day  #4. GERD - Well controlled on Nexium  I spent and additional 20 minutes of non-procedural time, including independent review of results as outlined above, communicating results with the patient directly, face-to-face time with the patient, coordinating care, ordering studies and medications as appropriate, and documentation.       [place] ,DO, FACG 09/26/2021, 11:09 AM

## 2021-09-27 LAB — FERRITIN: Ferritin: 7.7 ng/mL — ABNORMAL LOW (ref 10.0–291.0)

## 2021-09-28 ENCOUNTER — Other Ambulatory Visit: Payer: Self-pay

## 2021-09-28 ENCOUNTER — Telehealth: Payer: Self-pay | Admitting: Hematology and Oncology

## 2021-09-28 DIAGNOSIS — D509 Iron deficiency anemia, unspecified: Secondary | ICD-10-CM

## 2021-09-28 NOTE — Telephone Encounter (Signed)
Scheduled appt per 3/1 referral. Pt is aware of appt date and time. Pt is aware to arrive 15 mins prior to appt time and to bring and updated insurance card. Pt is aware of appt location.   ?

## 2021-10-17 ENCOUNTER — Encounter: Payer: PRIVATE HEALTH INSURANCE | Admitting: Hematology and Oncology

## 2021-10-21 ENCOUNTER — Telehealth: Payer: Self-pay | Admitting: Gastroenterology

## 2021-10-21 NOTE — Telephone Encounter (Signed)
Patient called to cancel today's appointment and also has questions regarding her upcoming appointment Tuesday, March 28. Please advise ?

## 2021-10-21 NOTE — Telephone Encounter (Signed)
Spoke with pt. Pt states she had an emergency last night and had to go out of town suddenly and was unable to prep for capsule endoscopy. VCE rescheduled for 11/02/21 at 8:30 am. Offered to mail pt updated instructions but pt stated she already had instructions from previous appt and would just change the dates. Pt verbalized understanding and had no other concerns at end of call.  ?

## 2021-10-25 ENCOUNTER — Ambulatory Visit (INDEPENDENT_AMBULATORY_CARE_PROVIDER_SITE_OTHER): Payer: PRIVATE HEALTH INSURANCE | Admitting: Gastroenterology

## 2021-10-25 ENCOUNTER — Other Ambulatory Visit: Payer: Self-pay

## 2021-10-25 ENCOUNTER — Encounter: Payer: Self-pay | Admitting: Gastroenterology

## 2021-10-25 VITALS — BP 142/96 | HR 101 | Ht 63.0 in | Wt 135.5 lb

## 2021-10-25 DIAGNOSIS — K641 Second degree hemorrhoids: Secondary | ICD-10-CM

## 2021-10-25 NOTE — Progress Notes (Signed)
? ?Chief Complaint:    Symptomatic Internal Hemorrhoids; Hemorrhoid Band Ligation ? ?GI History: 61 year old female with symptomatic hemorrhoids (intermittent BRBPR, rectal itching/irritation) ? ?- 09/26/21: Banding of LL hemorrhoid ?- 10/25/2021: Presents for hemorrhoid banding #2 ? ? ?Separately, history of IDA.  Was evaluated with EGD/colonoscopy in 04/2020 without clear etiology. ?- 09/26/2021: H/H 8.8/27.6 with MCV/RDW 79.8/17.3.  Declined from 11/2019 (9.9/31.8).  Ferritin 7.7, iron 19, TIBC 476, sat 4% (all essentially unchanged from 11/2019 despite oral iron).  Was referred for IV iron and for VCE ? ? ?Endoscopic History: ?- Colonoscopy (02/2020): 3 subcentimeter adenomas in the descending/ascending colon, 8 mm TA in sigmoid, 6 smaller sigmoid hyperplastic polyps.  25 mm polyp in the proximal rectum not resected in favor of EMR at the hospital.  Single small cecal AVM.  Internal hemorrhoids, sigmoid diverticulosis.  Normal TI ?- Colonoscopy (04/2020): 30 mm tubular adenoma with focal high grade dysplasia via piecemeal technique EMR.  Sigmoid diverticulosis ?- EGD (04/2020): 3 cm HH, otherwise normal ?- Colonoscopy (11/2020): Small cecal AVM, 4 sigmoid hyperplastic polyps ranging 1-3 mm (path:), 3 benign rectal hyperplastic polyps.  Post polypectomy scar in rectum (biopsied: Normal, benign tissue).  Sigmoid diverticulosis, medium sized grade 2 internal hemorrhoids.  Repeat in 3 years ? ?HPI:   ? ? ?Patient is a 61 y.o. femalewith a history of symptomatic internal hemorrhoids presenting to the Gastroenterology Clinic for follow-up and ongoing treatment. The patient presents with symptomatic grade 2 hemorrhoids, unresponsive to maximal medical therapy, requesting rubber band ligation of symptomatic hemorrhoidal disease. ? ?Did have some mild discomfort the day after hemorrhoid banding 1.  Otherwise did well.  Improved symptoms.  Constipation has improved as well. ? ?No change in medical or surgical history,  medications, allergies, social history since last appointment with me. ? ? ?Review of systems:     No chest pain, no SOB, no fevers, no urinary sx  ? ?Past Medical History:  ?Diagnosis Date  ? Allergy   ? seasonal allergies  ? Anemia   ? on meds  ? Cervical cancer ([place])   ? in her 72s pt states, laser surgery to remove  ? COPD (chronic obstructive pulmonary disease) ([place])   ? uses inhaler  ? Diverticulitis   ? GERD (gastroesophageal reflux disease)   ? on meds  ? Hypertension   ? on meds  ? Lung cancer ([place]) 2022  ? per pt (12/09/2020)  ? Seasonal allergies   ? ? ?Patient's surgical history, family medical history, social history, medications and allergies were all reviewed in Epic  ? ? ?Current Outpatient Medications  ?Medication Sig Dispense Refill  ? albuterol (VENTOLIN HFA) 108 (90 Base) MCG/ACT inhaler Inhale 1-2 puffs into the lungs every 6 (six) hours as needed for wheezing or shortness of breath. 18 g 0  ? cetirizine (ZYRTEC) 10 MG tablet Take 10 mg by mouth daily.    ? esomeprazole (NEXIUM) 20 MG capsule Take 20 mg by mouth daily at 12 noon.    ? ferrous sulfate 325 (65 FE) MG tablet Take 325 mg by mouth daily with breakfast.    ? folic acid (FOLVITE) 1 MG tablet Take 1 mg by mouth daily.    ? lisinopril (ZESTRIL) 20 MG tablet Take 1 tablet by mouth once daily 90 tablet 0  ? Magnesium 500 MG TABS Take 500 mg by mouth daily.    ? Multiple Vitamin (MULTIVITAMIN PO) Take 1 tablet by mouth daily.    ? Tiotropium Bromide Monohydrate (SPIRIVA  RESPIMAT) 2.5 MCG/ACT AERS Inhale 2 puffs into the lungs daily. (Patient taking differently: Inhale 2 puffs into the lungs daily as needed (SOB).) 4 g 0  ? ?No current facility-administered medications for this visit.  ? ? ?Physical Exam:   ? ? ?BP (!) 142/96   Pulse (!) 101   Ht 5\' 3"  (1.6 m)   Wt 135 lb 8 oz (61.5 kg)   BMI 24.00 kg/m?  ? ?GENERAL:  Pleasant female in NAD ?PSYCH: : Cooperative, normal affect ?Rectal exam: Sensation intact and preserved anal wink.   External skin tag.  Grade 2 hemorrhoids noted in RP and RA positions.  No external anal fissures noted. Normal sphincter tone. No palpable mass. No blood on the exam glove. (Chaperone: Curlene Labrum, CMA). ? ? ?IMPRESSION and PLAN:   ? ?#1.  Symptomatic internal hemorrhoids: ?PROCEDURE NOTE: ?The patient presents with symptomatic grade 2 hemorrhoids, unresponsive to maximal medical therapy, requesting rubber band ligation of symptomatic hemorrhoidal disease.  All risks, benefits and alternative forms of therapy were described and informed consent was obtained. ? ?In the Left Lateral Decubitus position, anoscopic examination revealed grade 2 hemorrhoids in the RA and RP position(s).  ?The anorectum was pre-medicated with RectiCare. ?The decision was made to band the RA internal hemorrhoid, and the [place] O?Regan System was used to perform band ligation without complication.  ?Digital anorectal examination was then performed to assure proper positioning of the band, and to adjust the banded tissue as required. ? The patient was discharged home without pain or other issues.  Dietary and behavioral recommendations were given and along with follow-up instructions.   ?  ?The following adjunctive treatments were recommended: ? ?-Resume high-fiber diet with fiber supplement (i.e. Citrucel or Benefiber) with goal for soft stools without straining to have a BM. ?-Resume adequate fluid intake. ? ?The patient will return in 2-4 for  follow-up and possible additional banding as required. ?No complications were encountered and the patient tolerated the procedure well. ? ?   ? ?#2.  Iron deficiency anemia ?- EGD colonoscopy completed 04/2020 without clear etiology.  Subsequent colonoscopy in 11/2020 (for polyp surveillance) also unremarkable from an ID standpoint. ?- Repeat CBC and iron indices last month again with IDA despite oral iron.  Was referred for IV iron ?- Complete IV iron series as previously ordered ?- Complete VCE as  previously ordered (scheduled for 11/02/2021) ? ?#3.  History of colon polyps ?- Repeat colonoscopy in 2025 for ongoing polyp surveillance ? ?#4.  GERD ?- Well-controlled on Nexium ?    ? ?Lavena Bullion ,DO, FACG 10/25/2021, 11:12 AM ? ?

## 2021-10-25 NOTE — Patient Instructions (Signed)
If you are age 62 or older, your body mass index should be between 23-30. Your There is no height or weight on file to calculate BMI. If this is out of the aforementioned range listed, please consider follow up with your Primary Care Provider. ? ?If you are age 65 or younger, your body mass index should be between 19-25. Your There is no height or weight on file to calculate BMI. If this is out of the aformentioned range listed, please consider follow up with your Primary Care Provider.  ? ?__________________________________________________________ ? ?The [facility] providers would like to encourage you to use [facility] to communicate with providers for non-urgent requests or questions.  Due to long hold times on the telephone, sending your provider a message by [facility] may be a faster and more efficient way to get a response.  Please allow 48 business hours for a response.  Please remember that this is for non-urgent requests.   ? ?HEMORRHOID BANDING PROCEDURE  ? ? FOLLOW-UP CARE ? ? ?The procedure you have had should have been relatively painless since the banding of the area involved does not have nerve endings and there is no pain sensation.  The rubber band cuts off the blood supply to the hemorrhoid and the band may fall off as soon as 48 hours after the banding (the band may occasionally be seen in the toilet bowl following a bowel movement). You may notice a temporary feeling of fullness in the rectum which should respond adequately to plain Tylenol? or Motrin?. ? ?Following the banding, avoid strenuous exercise that evening and resume full activity the next day.  A sitz bath (soaking in a warm tub) or bidet is soothing, and can be useful for cleansing the area after bowel movements.   ? ? ?To avoid constipation, take two tablespoons of natural wheat bran, natural oat bran, flax, Benefiber? or any over the counter fiber supplement and increase your water intake to 7-8 glasses daily.   ? ?Unless you have been  prescribed anorectal medication, do not put anything inside your rectum for two weeks: No suppositories, enemas, fingers, etc. ? ?Occasionally, you may have more bleeding than usual after the banding procedure.  This is often from the untreated hemorrhoids rather than the treated one.  Don?t be concerned if there is a tablespoon or so of blood.  If there is more blood than this, lie flat with your bottom higher than your head and apply an ice pack to the area. If the bleeding does not stop within a half an hour or if you feel faint, call our office at [phone] or go to the emergency room. ? ?Problems are not common; however, if there is a substantial amount of bleeding, severe pain, chills, fever or difficulty passing urine (very rare) or other problems, you should call us at [phone] or report to the nearest emergency room. ? ?Do not stay seated continuously for more than 2-3 hours for a day or two after the procedure.  Tighten your buttock muscles 10-15 times every two hours and take 10-15 deep breaths every 1-2 hours.  Do not spend more than a few minutes on the toilet if you cannot empty your bowel; instead re-visit the toilet at a later time. ? ?  ? ? ? ?We want to thank you for trusting [facility] with your care. All of our staff and providers value the relationships we have built with our patients, and it is an  honor to care for you.  ? ?We are writing to let you know that [facility] will close on Dec 12, 2021, and we invite you to continue to see Dr. Carmell Austria and Gerrit Heck at the [facility] [place] office location. We are consolidating our serices at these [facility] practices to better provide care. Our office staff will work with you to ensure a seamless transition.  ? ?Gerrit Heck, DO -Dr. Bryan Lemma will be movig to [facility] at [address] [place], [place] [address], effective Dec 12, 2021.  Contact  [phone] to schedule an appointment with him.  ? ?Carmell Austria, MD- Dr. Lyndel Safe will be movig to [facility] at [address], [place],  [address], effective Dec 12, 2021.  Contact [phone] to schedule an appointment with him.  ? ?Requesting Medical Records ?If you need to request your medical records, please follow the instructions below. Your medical records are confidential, and a copy can be transferred to another provider or released to you or another person you designate only with your permission. ? ?There are several ways to request your medical records: ?Requests for medical records can be submitted through our practice.   ?You can also request your records electronically, in your MyChart account by selecting the ?Request Health Records? tab.  ?If you need additional information on how to request records, please go to [email or web address]/, choose Patient Information, then select Request Medical Records. ?To make an appointment or if you have any questions about your health care needs, please contact our office at [phone] and one of our staff members will be glad to assist you. ?[facility] is committed to providing exceptional care for you and our community. Thank you for allowing [place] to serve your health care needs. ?Sincerely, ? ?Windy Canny, Director [facility] ?[facility] also offers convenient virtual care options. Sore throat? Sinus problems? Cold or flu symptoms? Get care from the comfort of home with [facility] Video Visits and e-Visits. Learn more about the non-emergency conditions treated and start your virtual visit at [email or web address]  ? ?Thank you for choosing me and [facility]. ? ?Gerrit Heck, D.O.  ?

## 2021-11-01 ENCOUNTER — Encounter: Payer: Self-pay | Admitting: Hematology and Oncology

## 2021-11-01 ENCOUNTER — Other Ambulatory Visit: Payer: Self-pay

## 2021-11-01 ENCOUNTER — Inpatient Hospital Stay: Payer: 59 | Attending: Hematology and Oncology | Admitting: Hematology and Oncology

## 2021-11-01 VITALS — BP 170/93 | HR 111 | Temp 98.2°F | Resp 18 | Ht 63.0 in | Wt 135.0 lb

## 2021-11-01 DIAGNOSIS — Z809 Family history of malignant neoplasm, unspecified: Secondary | ICD-10-CM | POA: Diagnosis not present

## 2021-11-01 DIAGNOSIS — G8929 Other chronic pain: Secondary | ICD-10-CM | POA: Diagnosis not present

## 2021-11-01 DIAGNOSIS — D509 Iron deficiency anemia, unspecified: Secondary | ICD-10-CM | POA: Insufficient documentation

## 2021-11-01 DIAGNOSIS — M810 Age-related osteoporosis without current pathological fracture: Secondary | ICD-10-CM | POA: Diagnosis not present

## 2021-11-01 DIAGNOSIS — D539 Nutritional anemia, unspecified: Secondary | ICD-10-CM

## 2021-11-01 DIAGNOSIS — Z720 Tobacco use: Secondary | ICD-10-CM

## 2021-11-01 DIAGNOSIS — F17210 Nicotine dependence, cigarettes, uncomplicated: Secondary | ICD-10-CM | POA: Diagnosis not present

## 2021-11-01 DIAGNOSIS — I10 Essential (primary) hypertension: Secondary | ICD-10-CM | POA: Diagnosis not present

## 2021-11-01 DIAGNOSIS — M549 Dorsalgia, unspecified: Secondary | ICD-10-CM | POA: Diagnosis not present

## 2021-11-01 NOTE — Assessment & Plan Note (Signed)
She has chronic back pain and has history of fracture ?I recommend vitamin D supplement ?We will check vitamin D level as well in her next visit ?

## 2021-11-01 NOTE — Assessment & Plan Note (Signed)
The patient likely have chronic GI bleed due to abnormal changes in her GI tract ?The patient also takes NSAID on a regular basis for chronic back pain ?She is symptomatic and has failed oral iron supplement ? ?The most likely cause of her anemia is due to chronic blood loss/malabsorption syndrome. ?We discussed some of the risks, benefits, and alternatives of intravenous iron infusions. The patient is symptomatic from anemia and the iron level is critically low. She tolerated oral iron supplement poorly and desires to achieved higher levels of iron faster for adequate hematopoesis. Some of the side-effects to be expected including risks of infusion reactions, phlebitis, headaches, nausea and fatigue.  The patient is willing to proceed. Patient education material was dispensed.  ?Goal is to keep ferritin level greater than 50 and resolution of anemia ?I recommend she continues on folic acid and vitamin B12 supplement ?I will check repeat iron studies and B12 in her next visit ?She is scheduled for camera test by gastroenterologist to look for other sources of bleeding ?

## 2021-11-01 NOTE — Progress Notes (Signed)
[place] ?CONSULT NOTE ? ?Patient Care Team: ?Libby Maw, MD as PCP - General (Family Medicine) ? ?ASSESSMENT & PLAN:  ?Iron deficiency anemia ?The patient likely have chronic GI bleed due to abnormal changes in her GI tract ?The patient also takes NSAID on a regular basis for chronic back pain ?She is symptomatic and has failed oral iron supplement ? ?The most likely cause of her anemia is due to chronic blood loss/malabsorption syndrome. ?We discussed some of the risks, benefits, and alternatives of intravenous iron infusions. The patient is symptomatic from anemia and the iron level is critically low. She tolerated oral iron supplement poorly and desires to achieved higher levels of iron faster for adequate hematopoesis. Some of the side-effects to be expected including risks of infusion reactions, phlebitis, headaches, nausea and fatigue.  The patient is willing to proceed. Patient education material was dispensed.  ?Goal is to keep ferritin level greater than 50 and resolution of anemia ?I recommend she continues on folic acid and vitamin B12 supplement ?I will check repeat iron studies and B12 in her next visit ?She is scheduled for camera test by gastroenterologist to look for other sources of bleeding ? ?Osteoporosis ?She has chronic back pain and has history of fracture ?I recommend vitamin D supplement ?We will check vitamin D level as well in her next visit ? ?Tobacco use ?The patient is motivated to quit smoking ?I encouraged the patient to quit ?Orders Placed This Encounter  ?Procedures  ? Iron and Iron Binding Capacity ([facility] only)  ?  Standing Status:   Future  ?  Standing Expiration Date:   11/02/2022  ? Ferritin  ?  Standing Status:   Future  ?  Standing Expiration Date:   11/01/2022  ? CBC with Differential/Platelet  ?  Standing Status:   Standing  ?  Number of Occurrences:   67  ?  Standing Expiration Date:   11/02/2022  ? Vitamin B12  ?  Standing Status:   Future  ?   Standing Expiration Date:   11/02/2022  ? Vitamin D 25 hydroxy  ?  Standing Status:   Future  ?  Standing Expiration Date:   11/01/2022  ? ABO/Rh  ?  Standing Status:   Future  ?  Standing Expiration Date:   11/02/2022  ? ? ?All questions were answered. The patient knows to call the clinic with any problems, questions or concerns. ? ?The total time spent in the appointment was 60 minutes encounter with patients including review of chart and various tests results, discussions about plan of care and coordination of care plan ? ?Heath Lark, MD ?4/4/20233:01 PM ? ? ?CHIEF COMPLAINTS/PURPOSE OF CONSULTATION:  ?Anemia ? ?HISTORY OF PRESENTING ILLNESS:  ?Valerie Russell 61 y.o. female is here because of anemia ? ?She was found to have abnormal CBC from blood work ?I have reviewed her CBC dated back to 2021 ?She used to have history of normal hemoglobin but starting in 2021, she become anemic may have microcytosis ?She had numerous endoscopy evaluation as listed: ?- Colonoscopy (02/2020): 3 subcentimeter adenomas in the descending/ascending colon, 8 mm TA in sigmoid, 6 smaller sigmoid hyperplastic polyps.  25 mm polyp in the proximal rectum not resected in favor of EMR at the hospital.  Single small cecal AVM.  Internal hemorrhoids, sigmoid diverticulosis.  Normal TI ?- Colonoscopy (04/2020): 30 mm tubular adenoma with focal high grade dysplasia via piecemeal technique EMR.  Sigmoid diverticulosis ?- EGD (04/2020): 3 cm HH,  otherwise normal ?- Colonoscopy (11/2020): Small cecal AVM, 4 sigmoid hyperplastic polyps ranging 1-3 mm (path:), 3 benign rectal hyperplastic polyps.  Post polypectomy scar in rectum (biopsied: Normal, benign tissue).  Sigmoid diverticulosis, medium sized grade 2 internal hemorrhoids.  ? ?She hasrecent chest pain on exertion, shortness of breath on minimal exertion, pre-syncopal episodes, palpitations, leg cramps and fatigue. ?She had not noticed any recent bleeding such as epistaxis, hematuria or  hematochezia ?The patient takes Aleve regularly for chronic back pain. She is not on antiplatelets agents. ?Her last colonoscopy was last year ?She had no prior history or diagnosis of cancer but but have history of abnormal Pap smear, treated with laser therapy. Her age appropriate screening programs are up-to-date. ?She denies any pica and eats a variety of diet. ?She donated blood once but has never received blood transfusion ?The patient was prescribed oral iron supplements and she takes 1 daily for 2 years without success of improving her blood count ?She stated that the oral iron supplement cause constipation and bloating ? ?MEDICAL HISTORY:  ?Past Medical History:  ?Diagnosis Date  ? Allergy   ? seasonal allergies  ? Anemia   ? on meds  ? Cervical cancer ([place])   ? in her 74s pt states, laser surgery to remove  ? COPD (chronic obstructive pulmonary disease) ([place])   ? uses inhaler  ? Diverticulitis   ? GERD (gastroesophageal reflux disease)   ? on meds  ? Hypertension   ? on meds  ? Lung cancer ([place]) 2022  ? per pt (12/09/2020)  ? Seasonal allergies   ? ? ?SURGICAL HISTORY: ?Past Surgical History:  ?Procedure Laterality Date  ? APPENDECTOMY    ? BIOPSY  05/10/2020  ? Procedure: BIOPSY;  Surgeon: Lavena Bullion, DO;  [facility];  Service: Gastroenterology;;  ? BIOPSY  12/23/2020  ? Procedure: BIOPSY;  Surgeon: Lavena Bullion, DO;  Location: [facility];  Service: Gastroenterology;;  ? CESAREAN SECTION  05/04/1997  ? COLONOSCOPY WITH PROPOFOL N/A 05/10/2020  ? Procedure: COLONOSCOPY WITH PROPOFOL;  Surgeon: Lavena Bullion, DO;  [facility]: [facility];  Service: Gastroenterology;  Laterality: N/A;  colonoscopy with EMR  ? COLONOSCOPY WITH PROPOFOL N/A 12/23/2020  ? Procedure: COLONOSCOPY WITH PROPOFOL;  Surgeon: Lavena Bullion, DO;  [facility];  Service: Gastroenterology;  Laterality: N/A;  ? ENDOSCOPIC MUCOSAL RESECTION N/A 05/10/2020  ? Procedure: ENDOSCOPIC MUCOSAL  RESECTION;  Surgeon: Lavena Bullion, DO;  [facility];  Service: Gastroenterology;  Laterality: N/A;  ? ESOPHAGOGASTRODUODENOSCOPY (EGD) WITH PROPOFOL N/A 05/10/2020  ? Procedure: ESOPHAGOGASTRODUODENOSCOPY (EGD) WITH PROPOFOL;  Surgeon: Lavena Bullion, DO;  [facility];  Service: Gastroenterology;  Laterality: N/A;  ? HEMOSTASIS CLIP PLACEMENT  05/10/2020  ? Procedure: HEMOSTASIS CLIP PLACEMENT;  Surgeon: Lavena Bullion, DO;  [facility];  Service: Gastroenterology;;  ? [place] / VULVAR    ? POLYPECTOMY  12/23/2020  ? Procedure: POLYPECTOMY;  Surgeon: Lavena Bullion, DO;  [facility];  Service: Gastroenterology;;  ? SUBMUCOSAL LIFTING INJECTION  05/10/2020  ? Procedure: SUBMUCOSAL LIFTING INJECTION;  Surgeon: Lavena Bullion, DO;  [facility];  Service: Gastroenterology;;  ? WISDOM TOOTH EXTRACTION    ? ? ?SOCIAL HISTORY: ?Social History  ? ?Socioeconomic History  ? Marital status: Single  ?  Spouse name: Not on file  ? Number of children: 1  ? Years of education: Not on file  ? Highest education level: Not on file  ?  Occupational History  ? Not on file  ?Tobacco Use  ? Smoking status: Every Day  ?  Packs/day: 0.25  ?  Years: 25.00  ?  Pack years: 6.25  ?  Types: Cigarettes  ? Smokeless tobacco: Never  ?Vaping Use  ? Vaping Use: Never used  ?Substance and Sexual Activity  ? Alcohol use: Yes  ?  Alcohol/week: 4.0 standard drinks  ?  Types: 4 Standard drinks or equivalent per week  ?  Comment: occasionally  ? Drug use: Not Currently  ?  Types: Marijuana  ?  Comment: sometimes  ? Sexual activity: Yes  ?  Birth control/protection: None  ?Other Topics Concern  ? Not on file  ?Social History Narrative  ? Not on file  ? ?Social Determinants of Health  ? ?Financial Resource Strain: Not on file  ?Food Insecurity: Not on file  ?Transportation Needs: Not on file  ?Physical Activity: Not on file  ?Stress: Not on file  ?Social  Connections: Not on file  ?Intimate Partner Violence: Not on file  ? ? ?FAMILY HISTORY: ?Family History  ?Problem Relation Age of Onset  ? Cancer Other   ? Hypertension Mother   ? COPD Mother   ? Stroke Mother   ? Colon cancer N

## 2021-11-01 NOTE — Assessment & Plan Note (Signed)
The patient is motivated to quit smoking ?I encouraged the patient to quit ?

## 2021-11-02 ENCOUNTER — Ambulatory Visit (INDEPENDENT_AMBULATORY_CARE_PROVIDER_SITE_OTHER): Payer: 59 | Admitting: Gastroenterology

## 2021-11-02 ENCOUNTER — Encounter: Payer: Self-pay | Admitting: Gastroenterology

## 2021-11-02 DIAGNOSIS — D509 Iron deficiency anemia, unspecified: Secondary | ICD-10-CM | POA: Diagnosis not present

## 2021-11-02 NOTE — Progress Notes (Signed)
SN: [id number] ?Exp: 06-07-2022 ?LOT: [id number] ? ?Patient arrived for VCE. Reported the prep went well. This RN explained capsule dietary restrictions for the next few hours. Pt advised to return at 4 pm to return capsule equipment.  Patient verbalized understanding. Opened capsule, ensured capsule was flashing prior to the patient swallowing the capsule. Patient swallowed capsule without difficulty.  Patient told to call the office with any questions and if capsule has not passed after 72 hours. No further questions by the conclusion of the visit.  [facility] ?

## 2021-11-02 NOTE — Patient Instructions (Signed)
You may have clear liquids beginning at 10:30 am after ingesting the capsule.    You can have a light lunch at 12:30 pm; sandwich and half bowl of soup.  Return to the office at 4 pm to return the equipment.   Return to you normal diet at 5 pm.   Call [phone] and ask for Nomie Buchberger BSN RN if you have any questions.  You should pass the capsule in your stool 8-48 hours after ingestion. If you have not passed the capsule, after 72 hours, please contact the office at [phone].    

## 2021-11-11 ENCOUNTER — Other Ambulatory Visit: Payer: Self-pay

## 2021-11-11 ENCOUNTER — Inpatient Hospital Stay: Payer: 59

## 2021-11-11 VITALS — BP 175/91 | HR 92 | Temp 98.3°F | Resp 18

## 2021-11-11 DIAGNOSIS — D509 Iron deficiency anemia, unspecified: Secondary | ICD-10-CM

## 2021-11-11 MED ORDER — SODIUM CHLORIDE 0.9 % IV SOLN: Freq: Once | INTRAVENOUS | Status: AC

## 2021-11-11 MED ORDER — SODIUM CHLORIDE 0.9 % IV SOLN
400.0000 mg | Freq: Once | INTRAVENOUS | Status: AC
  Administered 2021-11-11: 400 mg via INTRAVENOUS
  Filled 2021-11-11: qty 20

## 2021-11-11 NOTE — Progress Notes (Signed)
BP (!) 175/91 (BP Location: Left Arm, Patient Position: Sitting)   Pulse 92   Temp 98.3 ?F (36.8 ?C) (Oral)   Resp 18   SpO2 100%  ?Patient tolerated iron well- first time. Ate a snack, drank fluids- no issues. Waited through her 30 minute observation and was discharged ambulatory with no complaints. ?

## 2021-11-11 NOTE — Patient Instructions (Signed)
[facility]  Discharge Instructions: ?Thank you for choosing [facility] to provide your oncology and hematology care.  ? ?If you have a lab appointment with the Cancer Center, please go directly to the Cancer Center and check in at the registration area. ?  ?Wear comfortable clothing and clothing appropriate for easy access to any Portacath or PICC line.  ? ?We strive to give you quality time with your provider. You may need to reschedule your appointment if you arrive late (15 or more minutes).  Arriving late affects you and other patients whose appointments are after yours.  Also, if you miss three or more appointments without notifying the office, you may be dismissed from the clinic at the provider?s discretion.    ?  ?For prescription refill requests, have your pharmacy contact our office and allow 72 hours for refills to be completed.   ? ?Today you received the following chemotherapy and/or immunotherapy agents venofer    ?  ?To help prevent nausea and vomiting after your treatment, we encourage you to take your nausea medication as directed. ? ?BELOW ARE SYMPTOMS THAT SHOULD BE REPORTED IMMEDIATELY: ?*FEVER GREATER THAN 100.4 F (38 ?C) OR HIGHER ?*CHILLS OR SWEATING ?*NAUSEA AND VOMITING THAT IS NOT CONTROLLED WITH YOUR NAUSEA MEDICATION ?*UNUSUAL SHORTNESS OF BREATH ?*UNUSUAL BRUISING OR BLEEDING ?*URINARY PROBLEMS (pain or burning when urinating, or frequent urination) ?*BOWEL PROBLEMS (unusual diarrhea, constipation, pain near the anus) ?TENDERNESS IN MOUTH AND THROAT WITH OR WITHOUT PRESENCE OF ULCERS (sore throat, sores in mouth, or a toothache) ?UNUSUAL RASH, SWELLING OR PAIN  ?UNUSUAL VAGINAL DISCHARGE OR ITCHING  ? ?Items with * indicate a potential emergency and should be followed up as soon as possible or go to the Emergency Department if any problems should occur. ? ?Please show the CHEMOTHERAPY ALERT CARD or IMMUNOTHERAPY ALERT CARD at check-in to the  Emergency Department and triage nurse. ? ?Should you have questions after your visit or need to cancel or reschedule your appointment, please contact [facility]  Dept: [phone]  and follow the prompts.  Office hours are 8:00 a.m. to 4:30 p.m. Monday - Friday. Please note that voicemails left after 4:00 p.m. may not be returned until the following business day.  We are closed weekends and major holidays. You have access to a nurse at all times for urgent questions. Please call the main number to the clinic Dept: [phone] and follow the prompts. ? ? ?For any non-urgent questions, you may also contact your provider using MyChart. We now offer e-Visits for anyone 18 and older to request care online for non-urgent symptoms. For details visit [email or web address]. ?  ?Also download the MyChart app! Go to the app store, search "MyChart", open the app, select , and log in with your MyChart username and password. ? ?Due to Covid, a mask is required upon entering the hospital/clinic. If you do not have a mask, one will be given to you upon arrival. For doctor visits, patients may have 1 support person aged 18 or older with them. For treatment visits, patients cannot have anyone with them due to current Covid guidelines and our immunocompromised population.  ? ?Iron Sucrose Injection ?What is this medication? ?IRON SUCROSE (EYE ern SOO krose) treats low levels of iron (iron deficiency anemia) in people with kidney disease. Iron is a mineral that plays an important role in making red blood cells, which carry oxygen from your lungs to the rest   of your body. ?This medicine may be used for other purposes; ask your health care provider or pharmacist if you have questions. ?COMMON BRAND NAME(S): Venofer ?What should I tell my care team before I take this medication? ?They need to know if you have any of these conditions: ?Anemia not caused by low iron levels ?Heart disease ?High  levels of iron in the blood ?Kidney disease ?Liver disease ?An unusual or allergic reaction to iron, other medications, foods, dyes, or preservatives ?Pregnant or trying to get pregnant ?Breast-feeding ?How should I use this medication? ?This medication is for infusion into a vein. It is given in a hospital or clinic setting. ?Talk to your care team about the use of this medication in children. While this medication may be prescribed for children as young as 2 years for selected conditions, precautions do apply. ?Overdosage: If you think you have taken too much of this medicine contact a poison control center or emergency room at once. ?NOTE: This medicine is only for you. Do not share this medicine with others. ?What if I miss a dose? ?It is important not to miss your dose. Call your care team if you are unable to keep an appointment. ?What may interact with this medication? ?Do not take this medication with any of the following: ?Deferoxamine ?Dimercaprol ?Other iron products ?This medication may also interact with the following: ?Chloramphenicol ?Deferasirox ?This list may not describe all possible interactions. Give your health care provider a list of all the medicines, herbs, non-prescription drugs, or dietary supplements you use. Also tell them if you smoke, drink alcohol, or use illegal drugs. Some items may interact with your medicine. ?What should I watch for while using this medication? ?Visit your care team regularly. Tell your care team if your symptoms do not start to get better or if they get worse. You may need blood work done while you are taking this medication. ?You may need to follow a special diet. Talk to your care team. Foods that contain iron include: whole grains/cereals, dried fruits, beans, or peas, leafy green vegetables, and organ meats (liver, kidney). ?What side effects may I notice from receiving this medication? ?Side effects that you should report to your care team as soon as  possible: ?Allergic reactions--skin rash, itching, hives, swelling of the face, lips, tongue, or throat ?Low blood pressure--dizziness, feeling faint or lightheaded, blurry vision ?Shortness of breath ?Side effects that usually do not require medical attention (report to your care team if they continue or are bothersome): ?Flushing ?Headache ?Joint pain ?Muscle pain ?Nausea ?Pain, redness, or irritation at injection site ?This list may not describe all possible side effects. Call your doctor for medical advice about side effects. You may report side effects to FDA at 1-800-FDA-1088. ?Where should I keep my medication? ?This medication is given in a hospital or clinic and will not be stored at home. ?NOTE: This sheet is a summary. It may not cover all possible information. If you have questions about this medicine, talk to your doctor, pharmacist, or health care provider. ?? 2023 Elsevier/Gold Standard (2020-12-10 00:00:00) ? ?

## 2021-11-14 ENCOUNTER — Ambulatory Visit: Payer: PRIVATE HEALTH INSURANCE | Admitting: Gastroenterology

## 2021-11-17 ENCOUNTER — Telehealth: Payer: Self-pay | Admitting: Gastroenterology

## 2021-11-17 NOTE — Telephone Encounter (Signed)
Inbound call from patient requesting capsule results from 4/5 ?

## 2021-11-18 ENCOUNTER — Other Ambulatory Visit: Payer: Self-pay

## 2021-11-18 ENCOUNTER — Inpatient Hospital Stay: Payer: 59

## 2021-11-18 VITALS — BP 189/87 | HR 84 | Temp 99.0°F | Resp 19

## 2021-11-18 DIAGNOSIS — D509 Iron deficiency anemia, unspecified: Secondary | ICD-10-CM

## 2021-11-18 MED ORDER — SODIUM CHLORIDE 0.9 % IV SOLN: Freq: Once | INTRAVENOUS | Status: AC

## 2021-11-18 MED ORDER — SODIUM CHLORIDE 0.9 % IV SOLN
400.0000 mg | Freq: Once | INTRAVENOUS | Status: AC
  Administered 2021-11-18: 400 mg via INTRAVENOUS
  Filled 2021-11-18: qty 20

## 2021-11-18 NOTE — Telephone Encounter (Signed)
Spoke with pt and she is aware of capsule endo results. ?

## 2021-11-18 NOTE — Patient Instructions (Signed)
[facility]  Discharge Instructions: ?Thank you for choosing [facility] to provide your oncology and hematology care.  ? ?If you have a lab appointment with the Cancer Center, please go directly to the Cancer Center and check in at the registration area. ?  ?Wear comfortable clothing and clothing appropriate for easy access to any Portacath or PICC line.  ? ?We strive to give you quality time with your provider. You may need to reschedule your appointment if you arrive late (15 or more minutes).  Arriving late affects you and other patients whose appointments are after yours.  Also, if you miss three or more appointments without notifying the office, you may be dismissed from the clinic at the provider?s discretion.    ?  ?For prescription refill requests, have your pharmacy contact our office and allow 72 hours for refills to be completed.   ? ?Today you received the following chemotherapy and/or immunotherapy agents venofer    ?  ?To help prevent nausea and vomiting after your treatment, we encourage you to take your nausea medication as directed. ? ?BELOW ARE SYMPTOMS THAT SHOULD BE REPORTED IMMEDIATELY: ?*FEVER GREATER THAN 100.4 F (38 ?C) OR HIGHER ?*CHILLS OR SWEATING ?*NAUSEA AND VOMITING THAT IS NOT CONTROLLED WITH YOUR NAUSEA MEDICATION ?*UNUSUAL SHORTNESS OF BREATH ?*UNUSUAL BRUISING OR BLEEDING ?*URINARY PROBLEMS (pain or burning when urinating, or frequent urination) ?*BOWEL PROBLEMS (unusual diarrhea, constipation, pain near the anus) ?TENDERNESS IN MOUTH AND THROAT WITH OR WITHOUT PRESENCE OF ULCERS (sore throat, sores in mouth, or a toothache) ?UNUSUAL RASH, SWELLING OR PAIN  ?UNUSUAL VAGINAL DISCHARGE OR ITCHING  ? ?Items with * indicate a potential emergency and should be followed up as soon as possible or go to the Emergency Department if any problems should occur. ? ?Please show the CHEMOTHERAPY ALERT CARD or IMMUNOTHERAPY ALERT CARD at check-in to the  Emergency Department and triage nurse. ? ?Should you have questions after your visit or need to cancel or reschedule your appointment, please contact [facility]  Dept: [phone]  and follow the prompts.  Office hours are 8:00 a.m. to 4:30 p.m. Monday - Friday. Please note that voicemails left after 4:00 p.m. may not be returned until the following business day.  We are closed weekends and major holidays. You have access to a nurse at all times for urgent questions. Please call the main number to the clinic Dept: [phone] and follow the prompts. ? ? ?For any non-urgent questions, you may also contact your provider using MyChart. We now offer e-Visits for anyone 18 and older to request care online for non-urgent symptoms. For details visit [email or web address]. ?  ?Also download the MyChart app! Go to the app store, search "MyChart", open the app, select [place], and log in with your MyChart username and password. ? ?Due to Covid, a mask is required upon entering the hospital/clinic. If you do not have a mask, one will be given to you upon arrival. For doctor visits, patients may have 1 support person aged 18 or older with them. For treatment visits, patients cannot have anyone with them due to current Covid guidelines and our immunocompromised population.  ? ?Iron Sucrose Injection ?What is this medication? ?IRON SUCROSE (EYE ern SOO krose) treats low levels of iron (iron deficiency anemia) in people with kidney disease. Iron is a mineral that plays an important role in making red blood cells, which carry oxygen from your lungs to the rest   of your body. ?This medicine may be used for other purposes; ask your health care provider or pharmacist if you have questions. ?COMMON BRAND NAME(S): Venofer ?What should I tell my care team before I take this medication? ?They need to know if you have any of these conditions: ?Anemia not caused by low iron levels ?Heart disease ?High  levels of iron in the blood ?Kidney disease ?Liver disease ?An unusual or allergic reaction to iron, other medications, foods, dyes, or preservatives ?Pregnant or trying to get pregnant ?Breast-feeding ?How should I use this medication? ?This medication is for infusion into a vein. It is given in a hospital or clinic setting. ?Talk to your care team about the use of this medication in children. While this medication may be prescribed for children as young as 2 years for selected conditions, precautions do apply. ?Overdosage: If you think you have taken too much of this medicine contact a poison control center or emergency room at once. ?NOTE: This medicine is only for you. Do not share this medicine with others. ?What if I miss a dose? ?It is important not to miss your dose. Call your care team if you are unable to keep an appointment. ?What may interact with this medication? ?Do not take this medication with any of the following: ?Deferoxamine ?Dimercaprol ?Other iron products ?This medication may also interact with the following: ?Chloramphenicol ?Deferasirox ?This list may not describe all possible interactions. Give your health care provider a list of all the medicines, herbs, non-prescription drugs, or dietary supplements you use. Also tell them if you smoke, drink alcohol, or use illegal drugs. Some items may interact with your medicine. ?What should I watch for while using this medication? ?Visit your care team regularly. Tell your care team if your symptoms do not start to get better or if they get worse. You may need blood work done while you are taking this medication. ?You may need to follow a special diet. Talk to your care team. Foods that contain iron include: whole grains/cereals, dried fruits, beans, or peas, leafy green vegetables, and organ meats (liver, kidney). ?What side effects may I notice from receiving this medication? ?Side effects that you should report to your care team as soon as  possible: ?Allergic reactions--skin rash, itching, hives, swelling of the face, lips, tongue, or throat ?Low blood pressure--dizziness, feeling faint or lightheaded, blurry vision ?Shortness of breath ?Side effects that usually do not require medical attention (report to your care team if they continue or are bothersome): ?Flushing ?Headache ?Joint pain ?Muscle pain ?Nausea ?Pain, redness, or irritation at injection site ?This list may not describe all possible side effects. Call your doctor for medical advice about side effects. You may report side effects to FDA at 1-800-FDA-1088. ?Where should I keep my medication? ?This medication is given in a hospital or clinic and will not be stored at home. ?NOTE: This sheet is a summary. It may not cover all possible information. If you have questions about this medicine, talk to your doctor, pharmacist, or health care provider. ?? 2023 Elsevier/Gold Standard (2020-12-10 00:00:00) ? ?

## 2021-11-18 NOTE — Telephone Encounter (Signed)
Yes, just reviewed and n/f for the following: ? ?Procedure Information and Findings: ?1) Complete capsule endoscopy with adequate prep ?2) Few small erosions noted at 15 minutes, 53 minutes, and marks ?3) Otherwise negative study without active bleeding ? ?Summary: ?- Complete capsule endoscopy with revisualization of the small bowel.  With the exception of a few small, nonbleeding erosions, small bowel otherwise appears normal.  Based on appearance, these erosions and ongoing to be the etiology of the iron deficiency anemia. ? ?Recommendations: ?- Check CBC and iron panel as scheduled in the Hematology clinic ?- Stop NSAIDs ?- Follow-up in the Gastroenterology clinic as scheduled ?- Follow-up in the Hematology ?

## 2021-11-18 NOTE — Progress Notes (Signed)
Video Capsule Endoscopy completed for the following: ? ?Procedure Information and Findings: ?1) capsule endoscopy with adequate prep ?2) Few small erosions noted at 15 minutes, 53 minutes, and marks ?3) Otherwise negative study without active bleeding ? ?Summary: ?- Complete capsule endoscopy with revisualization of the small bowel.  With the exception of a few small, nonbleeding erosions, small bowel otherwise appears normal.  Based on appearance, these erosions and ongoing to be the etiology of the iron deficiency anemia. ? ?Recommendations: ?- Check CBC and iron panel as scheduled in the Hematology clinic ?- Stop NSAIDs ?- Follow-up in the Gastroenterology clinic as scheduled ?- Follow-up in the Hematology ?

## 2021-11-21 ENCOUNTER — Telehealth (INDEPENDENT_AMBULATORY_CARE_PROVIDER_SITE_OTHER): Payer: 59 | Admitting: Nurse Practitioner

## 2021-11-21 ENCOUNTER — Ambulatory Visit: Payer: PRIVATE HEALTH INSURANCE | Admitting: Gastroenterology

## 2021-11-21 ENCOUNTER — Telehealth: Payer: 59 | Admitting: Family Medicine

## 2021-11-21 ENCOUNTER — Encounter: Payer: Self-pay | Admitting: Nurse Practitioner

## 2021-11-21 VITALS — Temp 98.9°F

## 2021-11-21 DIAGNOSIS — J0110 Acute frontal sinusitis, unspecified: Secondary | ICD-10-CM | POA: Diagnosis not present

## 2021-11-21 MED ORDER — AMOXICILLIN-POT CLAVULANATE 875-125 MG PO TABS: 1.0000 | ORAL_TABLET | Freq: Two times a day (BID) | ORAL | 0 refills | Status: DC

## 2021-11-21 NOTE — Progress Notes (Signed)
?[facility] ?[facility] ?[place] ?[place] [address] ?Dept: [phone] ?Dept Fax: [phone] ? ?Virtual Video Visit ? ?I connected with Valerie Russell on 11/21/21 at  4:20 PM EDT by a video enabled telemedicine application and verified that I am speaking with the correct person using two identifiers. ? ?Location patient: Home ?Location provider: Clinic ?Persons participating in the virtual visit: Patient, Provider ? ?I discussed the limitations of evaluation and management by telemedicine and the availability of in person appointments. The patient expressed understanding and agreed to proceed. ? ?Chief Complaint  ?Patient presents with  ? Sinusitis  ?  Pt c/o low-grade fever, sinus pressure, sinus headache, and congestion x3-4 days. At-home covid test neg 11/21/21.   ? ? ?SUBJECTIVE: ? ?HPI: Valerie Russell is a 61 y.o. female who presents with low grade fever, sinus pressure and congestion for the last 3-4 days. She states last week her allergies were worsening and she was having a lot of post nasal drip. She had a negative home covid-19 test today. ? ?UPPER RESPIRATORY TRACT INFECTION ? ?Fever: yes 100.2 ?Cough: yes ?Shortness of breath: yes - with exertion ?Wheezing: no ?Chest pain: no ?Chest tightness: no ?Chest congestion: no ?Nasal congestion: yes ?Runny nose: no ?Post nasal drip: yes ?Sneezing: yes ?Sore throat: no ?Swollen glands: no ?Sinus pressure: yes ?Headache: yes ?Face pain: no ?Toothache: no ?Ear pain: no bilateral ?Ear pressure: yes bilateral ?Eyes red/itching:yes ?Eye drainage/crusting: no  ?Vomiting: no ?Rash: no ?Fatigue: yes ?Sick contacts: no ?Strep contacts: no  ?Context: worse ?Recurrent sinusitis: no ?Relief with OTC cold/cough medications: no  ?Treatments attempted: zyrtec, nasal spray ? ?Patient Active Problem List  ? Diagnosis Date Noted  ? Osteoporosis 11/01/2021  ? Protrusion of lumbar intervertebral disc 03/16/2021  ? Aortic  atherosclerosis ([place]) 02/01/2021  ? Snores 02/01/2021  ? Multiple polyps of sigmoid colon   ? Grade II internal hemorrhoids   ? Acute bacterial sinusitis 10/29/2020  ? History of rectal polyps   ? Iron deficiency anemia   ? Diverticulosis of colon without hemorrhage   ? Rectal polyp   ? Iron deficiency 03/08/2020  ? Hematochezia 03/08/2020  ? Pulmonary emphysema ([place]) 03/08/2020  ? Microcytic anemia 01/28/2020  ? Tobacco use 01/28/2020  ? Elevated LDL cholesterol level 01/28/2020  ? Pain in both lower extremities 01/28/2020  ? Dysthymia 01/28/2020  ? Essential hypertension 12/23/2019  ? Healthcare maintenance 12/23/2019  ? Reactive airway disease 12/23/2019  ? ? ?Past Surgical History:  ?Procedure Laterality Date  ? APPENDECTOMY    ? BIOPSY  05/10/2020  ? Procedure: BIOPSY;  Surgeon: Lavena Bullion, DO;  Location: [facility];  Service: Gastroenterology;;  ? BIOPSY  12/23/2020  ? Procedure: BIOPSY;  Surgeon: Lavena Bullion, DO;  Location: [facility];  Service: Gastroenterology;;  ? CESAREAN SECTION  05/04/1997  ? COLONOSCOPY WITH PROPOFOL N/A 05/10/2020  ? Procedure: COLONOSCOPY WITH PROPOFOL;  Surgeon: Lavena Bullion, DO;  Location: [facility];  Service: Gastroenterology;  Laterality: N/A;  colonoscopy with EMR  ? COLONOSCOPY WITH PROPOFOL N/A 12/23/2020  ? Procedure: COLONOSCOPY WITH PROPOFOL;  Surgeon: Lavena Bullion, DO;  Location: [facility];  Service: Gastroenterology;  Laterality: N/A;  ? ENDOSCOPIC MUCOSAL RESECTION N/A 05/10/2020  ? Procedure: ENDOSCOPIC MUCOSAL RESECTION;  Surgeon: Lavena Bullion, DO;  Location: [facility];  Service: Gastroenterology;  Laterality: N/A;  ? ESOPHAGOGASTRODUODENOSCOPY (EGD) WITH PROPOFOL N/A 05/10/2020  ? Procedure: ESOPHAGOGASTRODUODENOSCOPY (EGD) WITH PROPOFOL;  Surgeon: Lavena Bullion, DO;  Location: [facility];  Service: Gastroenterology;  Laterality: N/A;  ? HEMOSTASIS CLIP PLACEMENT  05/10/2020  ? Procedure: HEMOSTASIS CLIP PLACEMENT;   Surgeon: Lavena Bullion, DO;  Location: [facility];  Service: Gastroenterology;;  ? [place] / VULVAR    ? POLYPECTOMY  12/23/2020  ? Procedure: POLYPECTOMY;  Surgeon: Lavena Bullion, DO;  Location: [facility];  Service: Gastroenterology;;  ? SUBMUCOSAL LIFTING INJECTION  05/10/2020  ? Procedure: SUBMUCOSAL LIFTING INJECTION;  Surgeon: Lavena Bullion, DO;  Location: [facility];  Service: Gastroenterology;;  ? WISDOM TOOTH EXTRACTION    ? ? ?Family History  ?Problem Relation Age of Onset  ? Cancer Other   ? Hypertension Mother   ? COPD Mother   ? Stroke Mother   ? Colon cancer Neg Hx   ? Colon polyps Neg Hx   ? Esophageal cancer Neg Hx   ? Rectal cancer Neg Hx   ? Stomach cancer Neg Hx   ? ? ?Social History  ? ?Tobacco Use  ? Smoking status: Every Day  ?  Packs/day: 0.25  ?  Years: 25.00  ?  Pack years: 6.25  ?  Types: Cigarettes  ? Smokeless tobacco: Never  ?Vaping Use  ? Vaping Use: Never used  ?Substance Use Topics  ? Alcohol use: Yes  ?  Alcohol/week: 4.0 standard drinks  ?  Types: 4 Standard drinks or equivalent per week  ?  Comment: occasionally  ? Drug use: Not Currently  ?  Types: Marijuana  ?  Comment: sometimes  ? ? ? ?Current Outpatient Medications:  ?  amoxicillin-clavulanate (AUGMENTIN) 875-125 MG tablet, Take 1 tablet by mouth 2 (two) times daily., Disp: 20 tablet, Rfl: 0 ?  albuterol (VENTOLIN HFA) 108 (90 Base) MCG/ACT inhaler, Inhale 1-2 puffs into the lungs every 6 (six) hours as needed for wheezing or shortness of breath., Disp: 18 g, Rfl: 0 ?  cetirizine (ZYRTEC) 10 MG tablet, Take 10 mg by mouth daily., Disp: , Rfl:  ?  esomeprazole (NEXIUM) 20 MG capsule, Take 20 mg by mouth daily at 12 noon., Disp: , Rfl:  ?  folic acid (FOLVITE) 1 MG tablet, Take 1 mg by mouth daily., Disp: , Rfl:  ?  lisinopril (ZESTRIL) 20 MG tablet, Take 1 tablet by mouth once daily, Disp: 90 tablet, Rfl: 0 ?  Magnesium 500 MG TABS, Take 500 mg by mouth daily., Disp: , Rfl:  ?   Multiple Vitamin (MULTIVITAMIN PO), Take 1 tablet by mouth daily., Disp: , Rfl:  ?  Tiotropium Bromide Monohydrate (SPIRIVA RESPIMAT) 2.5 MCG/ACT AERS, Inhale 2 puffs into the lungs daily. (Patient taking differently: Inhale 2 puffs into the lungs daily as needed (SOB).), Disp: 4 g, Rfl: 0 ?  vitamin B-12 (CYANOCOBALAMIN) 1000 MCG tablet, Take 1,000 mcg by mouth daily., Disp: , Rfl:  ? ?No Known Allergies ? ?ROS: See pertinent positives and negatives per HPI. ? ?OBSERVATIONS/OBJECTIVE: ? ?VITALS per patient if applicable: ?Today's Vitals  ? 11/21/21 1616  ?Temp: 98.9 ?F (37.2 ?C)  ? ?There is no height or weight on file to calculate BMI. ?  ? ?GENERAL: Alert and oriented. Appears well and in no acute distress. ? ?HEENT: Atraumatic. Conjunctiva clear. No obvious abnormalities on inspection of external nose and ears. ? ?NECK: Normal movements of the head and neck. ? ?LUNGS: On inspection, no signs of respiratory distress. Breathing rate appears normal. No obvious gross SOB, gasping or wheezing, and no conversational dyspnea. ? ?CV: No obvious cyanosis. ? ?  MS: Moves all visible extremities without noticeable abnormality. ? ?PSYCH/NEURO: Pleasant and cooperative. No obvious depression or anxiety. Speech and thought processing grossly intact. ? ?ASSESSMENT AND PLAN: ? ?Problem List Items Addressed This Visit   ?None ?Visit Diagnoses   ? ? Acute non-recurrent frontal sinusitis    -  Primary  ? Symptoms ongoing for about 10 days.  We will treat with Augmentin twice a day for 10 days.  Continue Zyrtec, nasal spray.  Follow-up if symptoms worsen.  ? Relevant Medications  ? amoxicillin-clavulanate (AUGMENTIN) 875-125 MG tablet  ? ?  ? ?  ?I discussed the assessment and treatment plan with the patient. The patient was provided an opportunity to ask questions and all were answered. The patient agreed with the plan and demonstrated an understanding of the instructions. ?  ?The patient was advised to call back or seek an  in-person evaluation if the symptoms worsen or if the condition fails to improve as anticipated. ? ?Time spent on call:  8 minutes with patient face to face via video conference. More than 50% of this time was spent in co

## 2021-11-21 NOTE — Patient Instructions (Signed)
It was great to see you! ? ?Start Augmentin, antibiotic, twice a day for 10 days with food.  Make sure you are drinking plenty of fluids and getting rest.  Continue your Zyrtec and nasal spray. ? ?Let's follow-up if your symptoms worsen or with any concerns ? ?Take care, ? ?Vance Peper, NP ? ?

## 2021-11-25 ENCOUNTER — Other Ambulatory Visit: Payer: Self-pay

## 2021-11-25 ENCOUNTER — Encounter: Payer: Self-pay | Admitting: Hematology and Oncology

## 2021-11-25 ENCOUNTER — Inpatient Hospital Stay: Payer: 59

## 2021-11-25 VITALS — BP 181/92 | HR 86 | Temp 98.5°F | Resp 18

## 2021-11-25 DIAGNOSIS — D509 Iron deficiency anemia, unspecified: Secondary | ICD-10-CM

## 2021-11-25 MED ORDER — SODIUM CHLORIDE 0.9 % IV SOLN: Freq: Once | INTRAVENOUS | Status: AC

## 2021-11-25 MED ORDER — SODIUM CHLORIDE 0.9 % IV SOLN
400.0000 mg | Freq: Once | INTRAVENOUS | Status: AC
  Administered 2021-11-25: 400 mg via INTRAVENOUS
  Filled 2021-11-25: qty 20

## 2021-11-25 NOTE — Progress Notes (Signed)
BP (!) 181/92 (BP Location: Right Arm, Patient Position: Sitting)   Pulse 86   Temp 98.5 ?F (36.9 ?C) (Oral)   Resp 18   SpO2 99%  ?Patient tolerated her iron well today. Some hypertension, but that is normal for this patient. She refused her observation period today, but walked out with no complaints. ?

## 2021-11-25 NOTE — Patient Instructions (Signed)
[facility]  Discharge Instructions: ?Thank you for choosing [facility] to provide your oncology and hematology care.  ? ?If you have a lab appointment with the Cancer Center, please go directly to the Cancer Center and check in at the registration area. ?  ?Wear comfortable clothing and clothing appropriate for easy access to any Portacath or PICC line.  ? ?We strive to give you quality time with your provider. You may need to reschedule your appointment if you arrive late (15 or more minutes).  Arriving late affects you and other patients whose appointments are after yours.  Also, if you miss three or more appointments without notifying the office, you may be dismissed from the clinic at the provider?s discretion.    ?  ?For prescription refill requests, have your pharmacy contact our office and allow 72 hours for refills to be completed.   ? ?Today you received the following chemotherapy and/or immunotherapy agents venofer    ?  ?To help prevent nausea and vomiting after your treatment, we encourage you to take your nausea medication as directed. ? ?BELOW ARE SYMPTOMS THAT SHOULD BE REPORTED IMMEDIATELY: ?*FEVER GREATER THAN 100.4 F (38 ?C) OR HIGHER ?*CHILLS OR SWEATING ?*NAUSEA AND VOMITING THAT IS NOT CONTROLLED WITH YOUR NAUSEA MEDICATION ?*UNUSUAL SHORTNESS OF BREATH ?*UNUSUAL BRUISING OR BLEEDING ?*URINARY PROBLEMS (pain or burning when urinating, or frequent urination) ?*BOWEL PROBLEMS (unusual diarrhea, constipation, pain near the anus) ?TENDERNESS IN MOUTH AND THROAT WITH OR WITHOUT PRESENCE OF ULCERS (sore throat, sores in mouth, or a toothache) ?UNUSUAL RASH, SWELLING OR PAIN  ?UNUSUAL VAGINAL DISCHARGE OR ITCHING  ? ?Items with * indicate a potential emergency and should be followed up as soon as possible or go to the Emergency Department if any problems should occur. ? ?Please show the CHEMOTHERAPY ALERT CARD or IMMUNOTHERAPY ALERT CARD at check-in to the  Emergency Department and triage nurse. ? ?Should you have questions after your visit or need to cancel or reschedule your appointment, please contact [facility]  Dept: [phone]  and follow the prompts.  Office hours are 8:00 a.m. to 4:30 p.m. Monday - Friday. Please note that voicemails left after 4:00 p.m. may not be returned until the following business day.  We are closed weekends and major holidays. You have access to a nurse at all times for urgent questions. Please call the main number to the clinic Dept: [phone] and follow the prompts. ? ? ?For any non-urgent questions, you may also contact your provider using MyChart. We now offer e-Visits for anyone 18 and older to request care online for non-urgent symptoms. For details visit [email or web address]. ?  ?Also download the MyChart app! Go to the app store, search "MyChart", open the app, select [facility], and log in with your MyChart username and password. ? ?Due to Covid, a mask is required upon entering the hospital/clinic. If you do not have a mask, one will be given to you upon arrival. For doctor visits, patients may have 1 support person aged 18 or older with them. For treatment visits, patients cannot have anyone with them due to current Covid guidelines and our immunocompromised population.  ? ?Iron Sucrose Injection ?What is this medication? ?IRON SUCROSE (EYE ern SOO krose) treats low levels of iron (iron deficiency anemia) in people with kidney disease. Iron is a mineral that plays an important role in making red blood cells, which carry oxygen from your lungs to the rest   of your body. ?This medicine may be used for other purposes; ask your health care provider or pharmacist if you have questions. ?COMMON BRAND NAME(S): Venofer ?What should I tell my care team before I take this medication? ?They need to know if you have any of these conditions: ?Anemia not caused by low iron levels ?Heart disease ?High  levels of iron in the blood ?Kidney disease ?Liver disease ?An unusual or allergic reaction to iron, other medications, foods, dyes, or preservatives ?Pregnant or trying to get pregnant ?Breast-feeding ?How should I use this medication? ?This medication is for infusion into a vein. It is given in a hospital or clinic setting. ?Talk to your care team about the use of this medication in children. While this medication may be prescribed for children as young as 2 years for selected conditions, precautions do apply. ?Overdosage: If you think you have taken too much of this medicine contact a poison control center or emergency room at once. ?NOTE: This medicine is only for you. Do not share this medicine with others. ?What if I miss a dose? ?It is important not to miss your dose. Call your care team if you are unable to keep an appointment. ?What may interact with this medication? ?Do not take this medication with any of the following: ?Deferoxamine ?Dimercaprol ?Other iron products ?This medication may also interact with the following: ?Chloramphenicol ?Deferasirox ?This list may not describe all possible interactions. Give your health care provider a list of all the medicines, herbs, non-prescription drugs, or dietary supplements you use. Also tell them if you smoke, drink alcohol, or use illegal drugs. Some items may interact with your medicine. ?What should I watch for while using this medication? ?Visit your care team regularly. Tell your care team if your symptoms do not start to get better or if they get worse. You may need blood work done while you are taking this medication. ?You may need to follow a special diet. Talk to your care team. Foods that contain iron include: whole grains/cereals, dried fruits, beans, or peas, leafy green vegetables, and organ meats (liver, kidney). ?What side effects may I notice from receiving this medication? ?Side effects that you should report to your care team as soon as  possible: ?Allergic reactions--skin rash, itching, hives, swelling of the face, lips, tongue, or throat ?Low blood pressure--dizziness, feeling faint or lightheaded, blurry vision ?Shortness of breath ?Side effects that usually do not require medical attention (report to your care team if they continue or are bothersome): ?Flushing ?Headache ?Joint pain ?Muscle pain ?Nausea ?Pain, redness, or irritation at injection site ?This list may not describe all possible side effects. Call your doctor for medical advice about side effects. You may report side effects to FDA at 1-800-FDA-1088. ?Where should I keep my medication? ?This medication is given in a hospital or clinic and will not be stored at home. ?NOTE: This sheet is a summary. It may not cover all possible information. If you have questions about this medicine, talk to your doctor, pharmacist, or health care provider. ?? 2023 Elsevier/Gold Standard (2020-12-10 00:00:00) ? ?

## 2021-11-28 ENCOUNTER — Ambulatory Visit: Payer: 59 | Admitting: Gastroenterology

## 2021-12-13 ENCOUNTER — Encounter: Payer: Self-pay | Admitting: Hematology and Oncology

## 2021-12-27 ENCOUNTER — Other Ambulatory Visit: Payer: Self-pay | Admitting: Hematology and Oncology

## 2021-12-27 ENCOUNTER — Encounter: Payer: Self-pay | Admitting: Hematology and Oncology

## 2021-12-27 ENCOUNTER — Telehealth: Payer: Self-pay

## 2021-12-27 ENCOUNTER — Inpatient Hospital Stay: Payer: 59 | Attending: Hematology and Oncology

## 2021-12-27 ENCOUNTER — Inpatient Hospital Stay (HOSPITAL_BASED_OUTPATIENT_CLINIC_OR_DEPARTMENT_OTHER): Payer: 59 | Admitting: Hematology and Oncology

## 2021-12-27 ENCOUNTER — Other Ambulatory Visit: Payer: Self-pay

## 2021-12-27 VITALS — BP 196/92 | HR 85 | Temp 97.4°F | Resp 18 | Ht 63.0 in | Wt 135.2 lb

## 2021-12-27 DIAGNOSIS — M810 Age-related osteoporosis without current pathological fracture: Secondary | ICD-10-CM

## 2021-12-27 DIAGNOSIS — I10 Essential (primary) hypertension: Secondary | ICD-10-CM | POA: Diagnosis not present

## 2021-12-27 DIAGNOSIS — D509 Iron deficiency anemia, unspecified: Secondary | ICD-10-CM | POA: Diagnosis not present

## 2021-12-27 DIAGNOSIS — D539 Nutritional anemia, unspecified: Secondary | ICD-10-CM

## 2021-12-27 DIAGNOSIS — J449 Chronic obstructive pulmonary disease, unspecified: Secondary | ICD-10-CM | POA: Diagnosis not present

## 2021-12-27 LAB — CBC WITH DIFFERENTIAL/PLATELET
Abs Immature Granulocytes: 0.01 10*3/uL (ref 0.00–0.07)
Basophils Absolute: 0.1 10*3/uL (ref 0.0–0.1)
Basophils Relative: 1 %
Eosinophils Absolute: 0.1 10*3/uL (ref 0.0–0.5)
Eosinophils Relative: 3 %
HCT: 39.3 % (ref 36.0–46.0)
Hemoglobin: 13.3 g/dL (ref 12.0–15.0)
Immature Granulocytes: 0 %
Lymphocytes Relative: 31 %
Lymphs Abs: 1.5 10*3/uL (ref 0.7–4.0)
MCH: 31.3 pg (ref 26.0–34.0)
MCHC: 33.8 g/dL (ref 30.0–36.0)
MCV: 92.5 fL (ref 80.0–100.0)
Monocytes Absolute: 0.5 10*3/uL (ref 0.1–1.0)
Monocytes Relative: 10 %
Neutro Abs: 2.7 10*3/uL (ref 1.7–7.7)
Neutrophils Relative %: 55 %
Platelets: 299 10*3/uL (ref 150–400)
RBC: 4.25 MIL/uL (ref 3.87–5.11)
RDW: 19.6 % — ABNORMAL HIGH (ref 11.5–15.5)
WBC: 4.8 10*3/uL (ref 4.0–10.5)
nRBC: 0 % (ref 0.0–0.2)

## 2021-12-27 LAB — IRON AND IRON BINDING CAPACITY (CC-WL,HP ONLY)
Iron: 112 ug/dL (ref 28–170)
Saturation Ratios: 32 % — ABNORMAL HIGH (ref 10.4–31.8)
TIBC: 347 ug/dL (ref 250–450)
UIBC: 235 ug/dL (ref 148–442)

## 2021-12-27 LAB — VITAMIN B12: Vitamin B-12: 367 pg/mL (ref 180–914)

## 2021-12-27 LAB — FERRITIN: Ferritin: 143 ng/mL (ref 11–307)

## 2021-12-27 LAB — ABO/RH: ABO/RH(D): O POS

## 2021-12-27 LAB — VITAMIN D 25 HYDROXY (VIT D DEFICIENCY, FRACTURES): Vit D, 25-Hydroxy: 33.75 ng/mL (ref 30–100)

## 2021-12-27 NOTE — Telephone Encounter (Signed)
-----   Message from Heath Lark, MD sent at 12/27/2021  2:14 PM EDT ----- Pls call her B12 and vitamin D are ok

## 2021-12-27 NOTE — Assessment & Plan Note (Signed)
Vitamin D level is pending We will call her with test results 

## 2021-12-27 NOTE — Telephone Encounter (Signed)
Called and left below message. Ask her to call the office for questions. ?

## 2021-12-27 NOTE — Assessment & Plan Note (Signed)
Her blood pressure is very high She has not taken her blood pressure this morning I recommend she continues follow-up with primary care doctor for medication adjustment

## 2021-12-27 NOTE — Assessment & Plan Note (Signed)
She is no longer anemic Iron studies are adequate I recommend repeat blood work and follow-up in 6 months She is in agreement

## 2021-12-27 NOTE — Progress Notes (Signed)
[facility] OFFICE PROGRESS NOTE  Libby Maw, MD  ASSESSMENT & PLAN:  Iron deficiency anemia She is no longer anemic Iron studies are adequate I recommend repeat blood work and follow-up in 6 months She is in agreement  Osteoporosis Vitamin D level is pending We will call her with test results  Essential hypertension Her blood pressure is very high She has not taken her blood pressure this morning I recommend she continues follow-up with primary care doctor for medication adjustment  Orders Placed This Encounter  Procedures   Iron and Iron Binding Capacity ([facility] only)    Standing Status:   Future    Standing Expiration Date:   12/28/2022   Ferritin    Standing Status:   Future    Standing Expiration Date:   12/27/2022   CBC with Differential (Cancer Center Only)    Standing Status:   Future    Standing Expiration Date:   12/28/2022    The total time spent in the appointment was 20 minutes encounter with patients including review of chart and various tests results, discussions about plan of care and coordination of care plan   All questions were answered. The patient knows to call the clinic with any problems, questions or concerns. No barriers to learning was detected.    Heath Lark, MD 5/30/20231:28 PM  INTERVAL HISTORY: Valerie Russell 61 y.o. female returns for further follow-up for history of iron deficiency anemia She denies recent bleeding Her energy level is fair She denies symptoms with elevated blood pressure today She is in the process of applying for disability due to COPD and severe back pain  SUMMARY OF HEMATOLOGIC HISTORY:  She was found to have abnormal CBC from blood work I have reviewed her CBC dated back to 2021 She used to have history of normal hemoglobin but starting in 2021, she become anemic may have microcytosis She had numerous endoscopy evaluation as listed: - Colonoscopy (02/2020): 3 subcentimeter adenomas  in the descending/ascending colon, 8 mm TA in sigmoid, 6 smaller sigmoid hyperplastic polyps.  25 mm polyp in the proximal rectum not resected in favor of EMR at the hospital.  Single small cecal AVM.  Internal hemorrhoids, sigmoid diverticulosis.  Normal TI - Colonoscopy (04/2020): 30 mm tubular adenoma with focal high grade dysplasia via piecemeal technique EMR.  Sigmoid diverticulosis - EGD (04/2020): 3 cm HH, otherwise normal - Colonoscopy (11/2020): Small cecal AVM, 4 sigmoid hyperplastic polyps ranging 1-3 mm (path:), 3 benign rectal hyperplastic polyps.  Post polypectomy scar in rectum (biopsied: Normal, benign tissue).  Sigmoid diverticulosis, medium sized grade 2 internal hemorrhoids.   She hasrecent chest pain on exertion, shortness of breath on minimal exertion, pre-syncopal episodes, palpitations, leg cramps and fatigue. She had not noticed any recent bleeding such as epistaxis, hematuria or hematochezia The patient takes Aleve regularly for chronic back pain. She is not on antiplatelets agents. Her last colonoscopy was last year She had no prior history or diagnosis of cancer but but have history of abnormal Pap smear, treated with laser therapy. Her age appropriate screening programs are up-to-date. She denies any pica and eats a variety of diet. She donated blood once but has never received blood transfusion The patient was prescribed oral iron supplements and she takes 1 daily for 2 years without success of improving her blood count She stated that the oral iron supplement cause constipation and bloating In April 2023, she received 3 doses of intravenous iron sucrose with resolution of  anemia  I have reviewed the past medical history, past surgical history, social history and family history with the patient and they are unchanged from previous note.  ALLERGIES:  has No Known Allergies.  MEDICATIONS:  Current Outpatient Medications  Medication Sig Dispense Refill   albuterol  (VENTOLIN HFA) 108 (90 Base) MCG/ACT inhaler Inhale 1-2 puffs into the lungs every 6 (six) hours as needed for wheezing or shortness of breath. 18 g 0   amoxicillin-clavulanate (AUGMENTIN) 875-125 MG tablet Take 1 tablet by mouth 2 (two) times daily. 20 tablet 0   cetirizine (ZYRTEC) 10 MG tablet Take 10 mg by mouth daily.     esomeprazole (NEXIUM) 20 MG capsule Take 20 mg by mouth daily at 12 noon.     folic acid (FOLVITE) 1 MG tablet Take 1 mg by mouth daily.     lisinopril (ZESTRIL) 20 MG tablet Take 1 tablet by mouth once daily 90 tablet 0   Magnesium 500 MG TABS Take 500 mg by mouth daily.     Multiple Vitamin (MULTIVITAMIN PO) Take 1 tablet by mouth daily.     Tiotropium Bromide Monohydrate (SPIRIVA RESPIMAT) 2.5 MCG/ACT AERS Inhale 2 puffs into the lungs daily. (Patient taking differently: Inhale 2 puffs into the lungs daily as needed (SOB).) 4 g 0   vitamin B-12 (CYANOCOBALAMIN) 1000 MCG tablet Take 1,000 mcg by mouth daily.     No current facility-administered medications for this visit.     REVIEW OF SYSTEMS:   Constitutional: Denies fevers, chills or night sweats Eyes: Denies blurriness of vision Ears, nose, mouth, throat, and face: Denies mucositis or sore throat Respiratory: Denies cough, dyspnea or wheezes Cardiovascular: Denies palpitation, chest discomfort or lower extremity swelling Gastrointestinal:  Denies nausea, heartburn or change in bowel habits Skin: Denies abnormal skin rashes Lymphatics: Denies new lymphadenopathy or easy bruising Neurological:Denies numbness, tingling or new weaknesses Behavioral/Psych: Mood is stable, no new changes  All other systems were reviewed with the patient and are negative.  PHYSICAL EXAMINATION: ECOG PERFORMANCE STATUS: 1 - Symptomatic but completely ambulatory  Vitals:   12/27/21 1211  BP: (!) 196/92  Pulse: 85  Resp: 18  Temp: (!) 97.4 F (36.3 C)  SpO2: 99%   Filed Weights   12/27/21 1211  Weight: 135 lb 3.2 oz (61.3  kg)    GENERAL:alert, no distress and comfortable NEURO: alert & oriented x 3 with fluent speech, no focal motor/sensory deficits  LABORATORY DATA:  I have reviewed the data as listed     Component Value Date/Time   NA 140 12/23/2019 1047   K 4.7 12/23/2019 1047   CL 107 12/23/2019 1047   CO2 26 12/23/2019 1047   GLUCOSE 107 (H) 12/23/2019 1047   BUN 12 12/23/2019 1047   CREATININE 0.85 12/23/2019 1047   CALCIUM 9.5 12/23/2019 1047   PROT 7.4 12/23/2019 1047   ALBUMIN 4.4 12/23/2019 1047   AST 19 12/23/2019 1047   ALT 17 12/23/2019 1047   ALKPHOS 121 (H) 12/23/2019 1047   BILITOT 0.3 12/23/2019 1047   GFRNONAA >60 08/23/2015 1336   GFRAA >60 08/23/2015 1336    No results found for: SPEP, UPEP  Lab Results  Component Value Date   WBC 4.8 12/27/2021   NEUTROABS 2.7 12/27/2021   HGB 13.3 12/27/2021   HCT 39.3 12/27/2021   MCV 92.5 12/27/2021   PLT 299 12/27/2021      Chemistry      Component Value Date/Time   NA 140 12/23/2019 1047  K 4.7 12/23/2019 1047   CL 107 12/23/2019 1047   CO2 26 12/23/2019 1047   BUN 12 12/23/2019 1047   CREATININE 0.85 12/23/2019 1047      Component Value Date/Time   CALCIUM 9.5 12/23/2019 1047   ALKPHOS 121 (H) 12/23/2019 1047   AST 19 12/23/2019 1047   ALT 17 12/23/2019 1047   BILITOT 0.3 12/23/2019 1047

## 2022-01-17 ENCOUNTER — Telehealth: Payer: Self-pay | Admitting: Physical Medicine and Rehabilitation

## 2022-01-17 NOTE — Telephone Encounter (Signed)
Patient called needing to schedule an appointment with Dr. Ernestina Patches for her back.  The number to contact patient is [phone]

## 2022-01-24 ENCOUNTER — Ambulatory Visit: Payer: 59 | Admitting: Physical Medicine and Rehabilitation

## 2022-01-24 ENCOUNTER — Telehealth: Payer: Self-pay | Admitting: Physical Medicine and Rehabilitation

## 2022-02-09 ENCOUNTER — Ambulatory Visit: Payer: 59 | Admitting: Physical Medicine and Rehabilitation

## 2022-03-07 ENCOUNTER — Ambulatory Visit (HOSPITAL_COMMUNITY): Payer: 59

## 2022-03-07 ENCOUNTER — Other Ambulatory Visit: Payer: 59

## 2022-03-13 IMAGING — CT CT CHEST LUNG CANCER SCREENING LOW DOSE W/O CM
2 series · 12 of 32 positions shown, 13 images · non-contrast
Comparison: None.

CLINICAL DATA: Lung cancer screening, Current smoker with a 34 pk
yr hx. Asymptomatic.

EXAM:
CT CHEST WITHOUT CONTRAST LOW-DOSE FOR LUNG CANCER SCREENING
TECHNIQUE: Multidetector CT imaging of the chest was performed following the
standard protocol without IV contrast.

[Series 2: ldct screening <30 bmi · axial · 0.66mm/px · z∈[-261,-96]mm · 4 of 67 slices shown, 5 images]
[im 17/67  mediastinal]
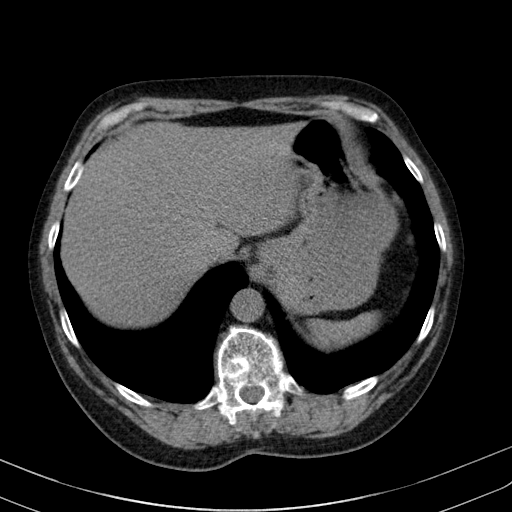
[im 17/67  lung]
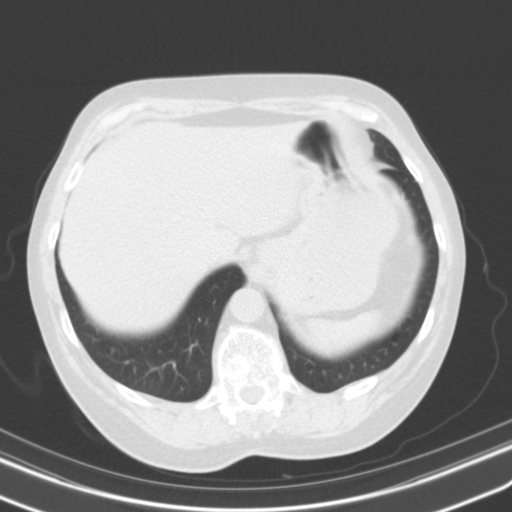
[im 32/67  lung]
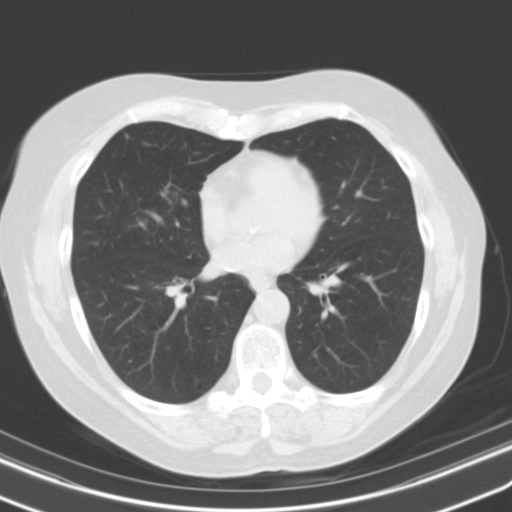
[im 34/67  lung]
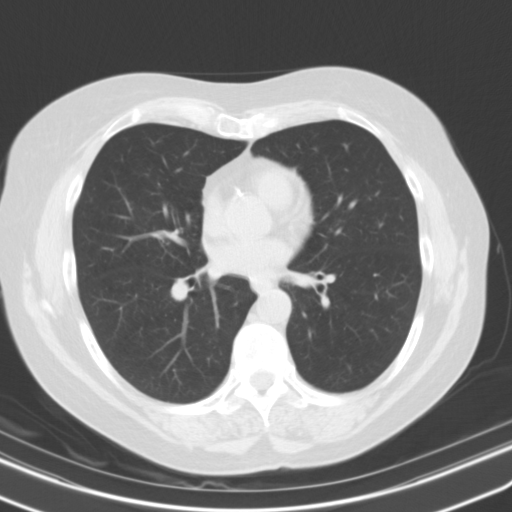
[im 50/67  lung]
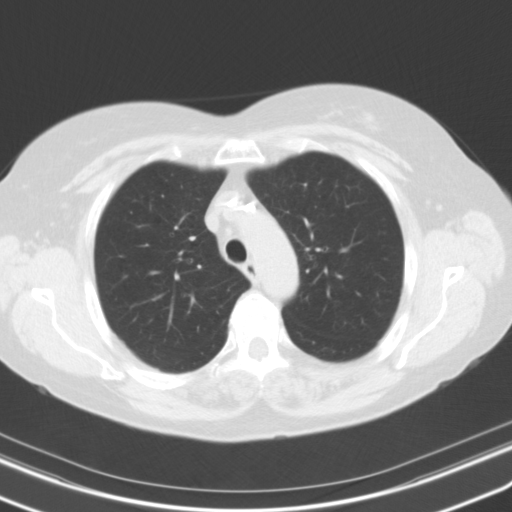

[Series 3: ldct screen lung · axial · 0.66mm/px · z∈[-275,-38]mm · 8 of 291 slices shown]
[im 27/291  lung]
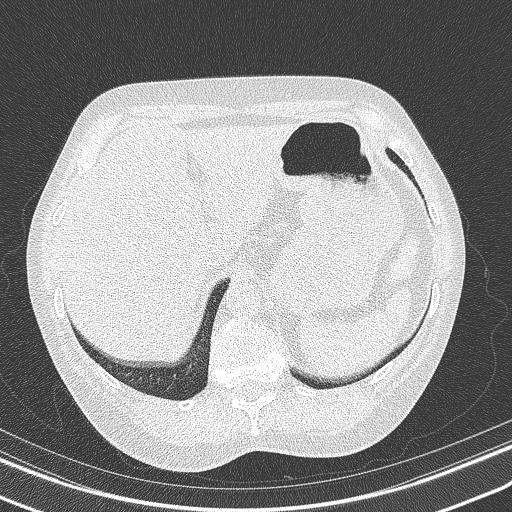
[im 66/291  lung]
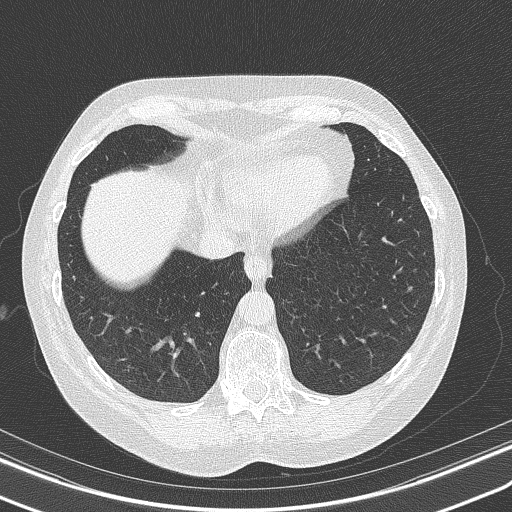
[im 93/291  lung]
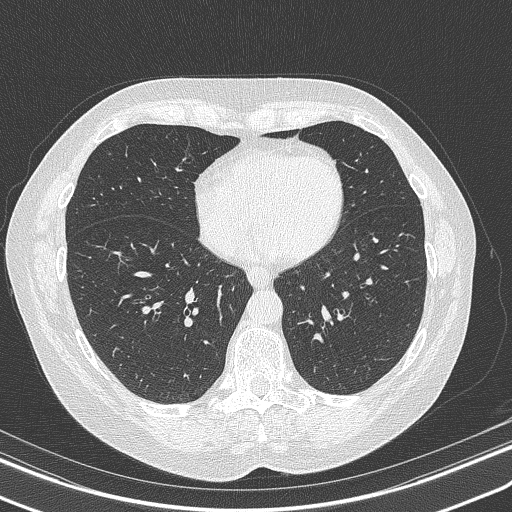
[im 119/291  lung]
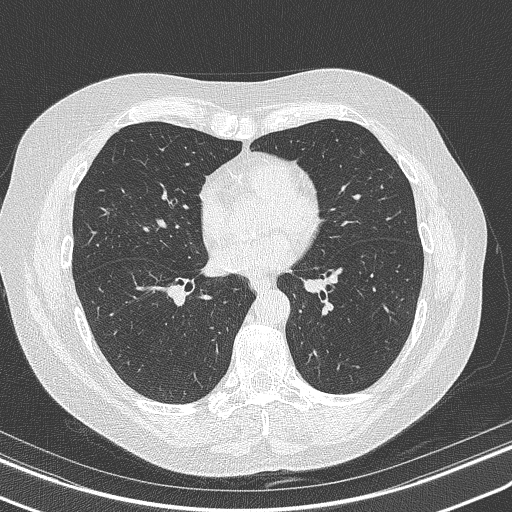
[im 159/291  lung]
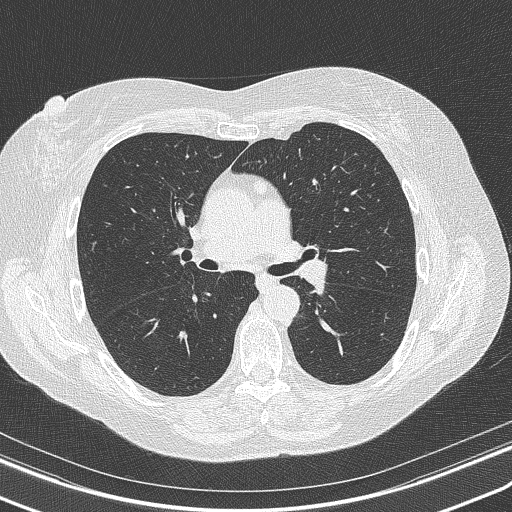
[im 198/291  lung]
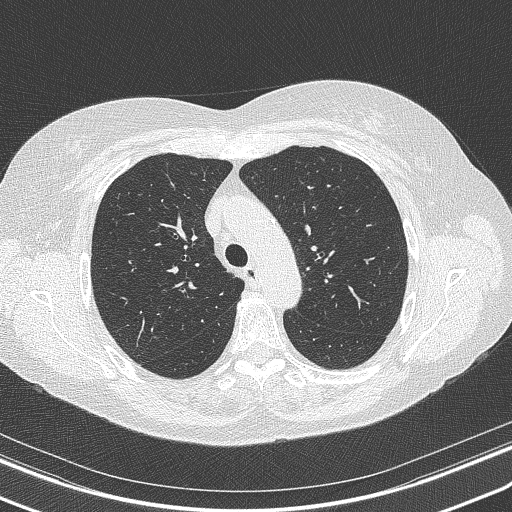
[im 225/291  lung]
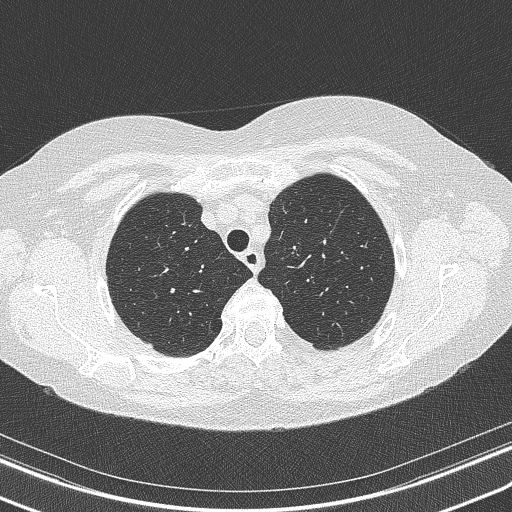
[im 264/291  lung]
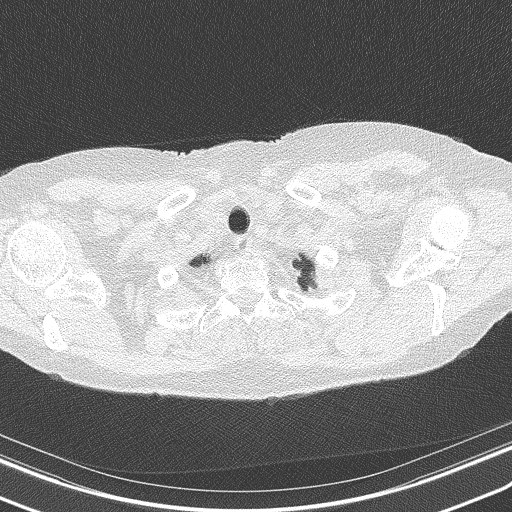

[12 of 32 positions shown; findings below may reference images not displayed]

FINDINGS: Cardiovascular: Normal heart size. No pericardial effusion.
Three-vessel coronary artery calcifications. Atherosclerotic disease
of the thoracic aorta.

Mediastinum/Nodes: Prominent prevascular lymph node measuring 8 mm
in short axis, likely reactive. Esophagus and thyroid are
unremarkable.

Lungs/Pleura: Central airways are patent. Mild centrilobular and
paraseptal emphysema. Solid nodule of the right middle lobe with a
mean diameter of 4 mm located on series 3, image 179.

Upper Abdomen: No acute abnormality.

Musculoskeletal: Age-indeterminate compression deformity of T12.
IMPRESSION: Lung-RADS 2S, benign appearance or behavior. Continue annual
screening with low-dose chest CT without contrast in 12 months. S
modifier for coronary artery calcifications and T12 compression
deformity.

Three-vessel coronary artery calcifications.

Age-indeterminate compression deformity of T12, correlate for point
tenderness.

Aortic Atherosclerosis (I01DY-5XT.T) and Emphysema (I01DY-BPF.U).

## 2022-03-29 ENCOUNTER — Other Ambulatory Visit: Payer: Self-pay

## 2022-03-29 ENCOUNTER — Ambulatory Visit (HOSPITAL_COMMUNITY): Admission: RE | Admit: 2022-03-29 | Payer: 59 | Source: Ambulatory Visit

## 2022-03-29 ENCOUNTER — Telehealth: Payer: Self-pay | Admitting: Acute Care

## 2022-03-29 DIAGNOSIS — Z122 Encounter for screening for malignant neoplasm of respiratory organs: Secondary | ICD-10-CM

## 2022-03-29 DIAGNOSIS — Z87891 Personal history of nicotine dependence: Secondary | ICD-10-CM

## 2022-03-29 DIAGNOSIS — F17210 Nicotine dependence, cigarettes, uncomplicated: Secondary | ICD-10-CM

## 2022-03-29 NOTE — Telephone Encounter (Signed)
New order for LDCT placed.  Attempted to call patient. Unable to reach but left VM and call back information

## 2022-03-31 ENCOUNTER — Ambulatory Visit (HOSPITAL_COMMUNITY): Payer: Commercial Managed Care - HMO

## 2022-04-06 ENCOUNTER — Ambulatory Visit (HOSPITAL_COMMUNITY): Payer: Self-pay

## 2022-04-07 ENCOUNTER — Ambulatory Visit (HOSPITAL_COMMUNITY): Payer: Commercial Managed Care - HMO

## 2022-04-14 ENCOUNTER — Ambulatory Visit
Admission: RE | Admit: 2022-04-14 | Discharge: 2022-04-14 | Disposition: A | Discharge status: Home / Self Care | Payer: Commercial Managed Care - HMO | Source: Ambulatory Visit | Attending: Acute Care | Admitting: Acute Care

## 2022-04-14 DIAGNOSIS — F17210 Nicotine dependence, cigarettes, uncomplicated: Secondary | ICD-10-CM

## 2022-04-14 DIAGNOSIS — Z122 Encounter for screening for malignant neoplasm of respiratory organs: Secondary | ICD-10-CM

## 2022-04-14 DIAGNOSIS — Z87891 Personal history of nicotine dependence: Secondary | ICD-10-CM

## 2022-04-18 ENCOUNTER — Other Ambulatory Visit: Payer: Self-pay | Admitting: Acute Care

## 2022-04-18 DIAGNOSIS — Z87891 Personal history of nicotine dependence: Secondary | ICD-10-CM

## 2022-04-18 DIAGNOSIS — Z122 Encounter for screening for malignant neoplasm of respiratory organs: Secondary | ICD-10-CM

## 2022-04-18 DIAGNOSIS — F17210 Nicotine dependence, cigarettes, uncomplicated: Secondary | ICD-10-CM

## 2022-04-27 ENCOUNTER — Other Ambulatory Visit: Payer: Commercial Managed Care - HMO

## 2022-05-03 ENCOUNTER — Telehealth: Payer: Self-pay

## 2022-05-03 NOTE — Telephone Encounter (Signed)
Pt called and LVM stating her spiriva is too expensive and she is asking for call back regarding this. Attempted to call pt. LVM for call back to further discuss.

## 2022-05-04 ENCOUNTER — Telehealth: Payer: Self-pay | Admitting: Pulmonary Disease

## 2022-05-04 NOTE — Telephone Encounter (Signed)
Called and given below message. She verbalized understanding. She already contacted the prescriber.

## 2022-05-04 NOTE — Telephone Encounter (Signed)
She should discuss this med refill with her prescriber or PCP If I refilled the last time, it was for a favor as I do not generally prescribe inhalers

## 2022-05-05 NOTE — Telephone Encounter (Signed)
She will need an OV to get further refills.

## 2022-05-08 NOTE — Telephone Encounter (Signed)
Patient scheduled for follow up 05/17/2022 at 9:30am with BW.

## 2022-05-17 ENCOUNTER — Telehealth: Payer: Self-pay | Admitting: Primary Care

## 2022-05-17 ENCOUNTER — Ambulatory Visit (INDEPENDENT_AMBULATORY_CARE_PROVIDER_SITE_OTHER): Payer: Commercial Managed Care - HMO | Admitting: Primary Care

## 2022-05-17 ENCOUNTER — Other Ambulatory Visit (HOSPITAL_COMMUNITY): Payer: Self-pay

## 2022-05-17 ENCOUNTER — Encounter: Payer: Self-pay | Admitting: Primary Care

## 2022-05-17 DIAGNOSIS — J439 Emphysema, unspecified: Secondary | ICD-10-CM | POA: Diagnosis not present

## 2022-05-17 DIAGNOSIS — Z720 Tobacco use: Secondary | ICD-10-CM

## 2022-05-17 MED ORDER — SPIRIVA RESPIMAT 2.5 MCG/ACT IN AERS: 2.0000 | INHALATION_SPRAY | Freq: Every day | RESPIRATORY_TRACT | 5 refills | Status: DC

## 2022-05-17 NOTE — Telephone Encounter (Signed)
Patient states that Spiriva is costing her $600.00  Can we run a ticket to see what is covered under patients insurance  Thank you

## 2022-05-17 NOTE — Telephone Encounter (Signed)
Test claims to the insurance shows that both options for Spiriva are not covered by the patients insurance.  Incruse shows covered for a co-pay of $15.00.

## 2022-05-17 NOTE — Telephone Encounter (Signed)
Can we send in prescription for Incruse 1 puff daily in the morning and discontinue Spiriva

## 2022-05-17 NOTE — Progress Notes (Signed)
@Patient  ID: Valerie Russell, female    DOB: 1961-07-15, 61 y.o.   MRN: [id number]  Chief Complaint  Patient presents with   Follow-up    Referring provider: Libby Maw  HPI: 61 year old female, current everyday smoker.  Past medical history significant for hypertension, aortic atherosclerosis, COPD, pulmonary emphysema, reactive airway disease.  05/17/2022 Patient presents today for overdue follow-up. She is doing alright. She has a morning cough with mucus production. She experiences dyspnea with exertion. Associated allergy symptoms. She is not currently on Spiriva d/t cost. She uses albuterol 2-3 times a day. She is still smoking but cut back. She tried chantix but did not tolerate.   LDCT 04/17/22 showed lung RADS 2, benign appearance or behavior. Biapical pleuroparenchymal scarring. Centrilobular and paraseptal emphysema. Pulmonary nodules measure 5.2mm or less. No new or suspicious pulmonary nodules.  CAT score- 25 SABA use- 2-3 times daily    No Known Allergies  Immunization History  Administered Date(s) Administered   Janssen (J&J) SARS-COV-2 Vaccination 05/29/2020   PFIZER(Purple Top)SARS-COV-2 Vaccination 10/10/2019, 11/03/2019   Tdap 07/05/2014, 09/19/2016    Past Medical History:  Diagnosis Date   Allergy    seasonal allergies   Anemia    on meds   Cervical cancer ([place])    in her 49s pt states, laser surgery to remove   COPD (chronic obstructive pulmonary disease) ([facility])    uses inhaler   Diverticulitis    GERD (gastroesophageal reflux disease)    on meds   Hypertension    on meds   Lung cancer ([facility]) 2022   per pt (12/09/2020)   Seasonal allergies     Tobacco History: Social History   Tobacco Use  Smoking Status Every Day   Packs/day: 0.25   Years: 25.00   Total pack years: 6.25   Types: Cigarettes  Smokeless Tobacco Never   Ready to quit: Not Answered Counseling given: Not Answered   Outpatient Medications Prior to Visit   Medication Sig Dispense Refill   albuterol (VENTOLIN HFA) 108 (90 Base) MCG/ACT inhaler Inhale 1-2 puffs into the lungs every 6 (six) hours as needed for wheezing or shortness of breath. 18 g 0   cetirizine (ZYRTEC) 10 MG tablet Take 10 mg by mouth daily.     esomeprazole (NEXIUM) 20 MG capsule Take 20 mg by mouth daily at 12 noon.     folic acid (FOLVITE) 1 MG tablet Take 1 mg by mouth daily.     lisinopril (ZESTRIL) 20 MG tablet Take 1 tablet by mouth once daily 90 tablet 0   Magnesium 500 MG TABS Take 500 mg by mouth daily.     Multiple Vitamin (MULTIVITAMIN PO) Take 1 tablet by mouth daily.     vitamin B-12 (CYANOCOBALAMIN) 1000 MCG tablet Take 1,000 mcg by mouth daily.     amoxicillin-clavulanate (AUGMENTIN) 875-125 MG tablet Take 1 tablet by mouth 2 (two) times daily. (Patient not taking: Reported on 05/17/2022) 20 tablet 0   Tiotropium Bromide Monohydrate (SPIRIVA RESPIMAT) 2.5 MCG/ACT AERS Inhale 2 puffs into the lungs daily. (Patient not taking: Reported on 05/17/2022) 4 g 0   No facility-administered medications prior to visit.   Review of Systems  Review of Systems  Constitutional: Negative.   HENT:  Positive for congestion and postnasal drip.   Respiratory:  Positive for cough and shortness of breath. Negative for wheezing.   Cardiovascular: Negative.     Physical Exam  BP 124/68 (BP Location: Left Arm, Patient Position: Sitting, Cuff Size:  Normal)   Pulse 96   Temp 97.9 F (36.6 C) (Oral)   Ht 5\' 3"  (1.6 m)   Wt 133 lb 3.2 oz (60.4 kg)   SpO2 100%   BMI 23.60 kg/m  Physical Exam Constitutional:      Appearance: Normal appearance.  HENT:     Head: Normocephalic and atraumatic.     Mouth/Throat:     Mouth: Mucous membranes are moist.     Pharynx: Oropharynx is clear.  Cardiovascular:     Rate and Rhythm: Normal rate and regular rhythm.  Pulmonary:     Effort: Pulmonary effort is normal.     Breath sounds: Normal breath sounds. No wheezing, rhonchi or rales.   Musculoskeletal:        General: Normal range of motion.  Skin:    General: Skin is warm and dry.  Neurological:     General: No focal deficit present.     Mental Status: She is alert and oriented to person, place, and time. Mental status is at baseline.  Psychiatric:        Mood and Affect: Mood normal.        Behavior: Behavior normal.        Thought Content: Thought content normal.        Judgment: Judgment normal.      Lab Results:  CBC    Component Value Date/Time   WBC 4.8 12/27/2021 1139   RBC 4.25 12/27/2021 1139   HGB 13.3 12/27/2021 1139   HCT 39.3 12/27/2021 1139   PLT 299 12/27/2021 1139   MCV 92.5 12/27/2021 1139   MCH 31.3 12/27/2021 1139   MCHC 33.8 12/27/2021 1139   RDW 19.6 (H) 12/27/2021 1139   LYMPHSABS 1.5 12/27/2021 1139   MONOABS 0.5 12/27/2021 1139   EOSABS 0.1 12/27/2021 1139   BASOSABS 0.1 12/27/2021 1139    BMET    Component Value Date/Time   NA 140 12/23/2019 1047   K 4.7 12/23/2019 1047   CL 107 12/23/2019 1047   CO2 26 12/23/2019 1047   GLUCOSE 107 (H) 12/23/2019 1047   BUN 12 12/23/2019 1047   CREATININE 0.85 12/23/2019 1047   CALCIUM 9.5 12/23/2019 1047   GFRNONAA >60 08/23/2015 1336   GFRAA >60 08/23/2015 1336    BNP No results found for: "BNP"  ProBNP No results found for: "PROBNP"  Imaging: No results found.   Assessment & Plan:   COPD (chronic obstructive pulmonary disease) ([place]) - She is an active smoker. PFTs showed mild obstructive disease. She is maintained on prn albuterol. Using SABA 2-3 times a day. CAT score 25. Recommend resuming Spiriva Respimat 2.79mcg two puffs once daily. Smoking cessation strongly encouraged. FU in 6 months or sooner if needed.   Tobacco use -  Following with lung cancer screening program. LDCT 04/17/22 showed lung RADS 2, benign appearance or behavior. Pulmonary nodules measure 5.65mm or less. No new or suspicious pulmonary nodules. Continue annual CT imaging.    Martyn Ehrich,  NP 05/17/2022

## 2022-05-17 NOTE — Assessment & Plan Note (Addendum)
-   She is an active smoker. PFTs showed mild obstructive disease. She is maintained on prn albuterol. Using SABA 2-3 times a day. CAT score 25. Recommend resuming Spiriva Respimat 2.16mcg two puffs once daily. Smoking cessation strongly encouraged. FU in 6 months or sooner if needed.

## 2022-05-17 NOTE — Patient Instructions (Addendum)
Recommendations: - Resume Spiriva respimat- take two puffs daily in the morning (call if not affordable) - Continue Albuterol 2 puffs every 4-6 hours as needed for shortness of breath/wheezing  - Take mucinex 600mg  at bedtime for chest congestion - Continue Zyrtec 10mg  daily  - Smoking cessation strongly encouraged (taper amount you smoke like we spoke about, use nicotine gum or patches and pick quit date) - Continue lung cancer screening- low dose CT next due in Sept 2024  Rx: Spiriva respimat  Follow-up: - 6 months with Dr. Ander Slade    Chronic Obstructive Pulmonary Disease  Chronic obstructive pulmonary disease (COPD) is a long-term (chronic) lung problem. When you have COPD, it is hard for air to get in and out of your lungs. Usually the condition gets worse over time, and your lungs will never return to normal. There are things you can do to keep yourself as healthy as possible. What are the causes? Smoking. This is the most common cause. Certain genes passed from parent to child (inherited). What increases the risk? Being exposed to secondhand smoke from cigarettes, pipes, or cigars. Being exposed to chemicals and other irritants, such as fumes and dust in the work environment. Having chronic lung conditions or infections. What are the signs or symptoms? Shortness of breath, especially during physical activity. A long-term cough with a large amount of thick mucus. Sometimes, the cough may not have any mucus (dry cough). Wheezing. Breathing quickly. Skin that looks gray or blue, especially in the fingers, toes, or lips. Feeling tired (fatigue). Weight loss. Chest tightness. Having infections often. Episodes when breathing symptoms become much worse (exacerbations). At the later stages of this disease, you may have swelling in the ankles, feet, or legs. How is this treated? Taking medicines. Quitting smoking, if you smoke. Rehabilitation. This includes steps to make  your body work better. It may involve a team of specialists. Doing exercises. Making changes to your diet. Using oxygen. Lung surgery. Lung transplant. Comfort measures (palliative care). Follow these instructions at home: Medicines Take over-the-counter and prescription medicines only as told by your doctor. Talk to your doctor before taking any cough or allergy medicines. You may need to avoid medicines that cause your lungs to be dry. Lifestyle If you smoke, stop smoking. Smoking makes the problem worse. Do not smoke or use any products that contain nicotine or tobacco. If you need help quitting, ask your doctor. Avoid being around things that make your breathing worse. This may include smoke, chemicals, and fumes. Stay active, but remember to rest as well. Learn and use tips on how to manage stress and control your breathing. Make sure you get enough sleep. Most adults need at least 7 hours of sleep every night. Eat healthy foods. Eat smaller meals more often. Rest before meals. Controlled breathing Learn and use tips on how to control your breathing as told by your doctor. Try: Breathing in (inhaling) through your nose for 1 second. Then, pucker your lips and breath out (exhale) through your lips for 2 seconds. Putting one hand on your belly (abdomen). Breathe in slowly through your nose for 1 second. Your hand on your belly should move out. Pucker your lips and breathe out slowly through your lips. Your hand on your belly should move in as you breathe out.  Controlled coughing Learn and use controlled coughing to clear mucus from your lungs. Follow these steps: Lean your head a little forward. Breathe in deeply. Try to hold your breath for 3  seconds. Keep your mouth slightly open while coughing 2 times. Spit any mucus out into a tissue. Rest and do the steps again 1 or 2 times as needed. General instructions Make sure you get all the shots (vaccines) that your doctor  recommends. Ask your doctor about a flu shot and a pneumonia shot. Use oxygen therapy and pulmonary rehabilitation if told by your doctor. If you need home oxygen therapy, ask your doctor if you should buy a tool to measure your oxygen level (oximeter). Make a COPD action plan with your doctor. This helps you to know what to do if you feel worse than usual. Manage any other conditions you have as told by your doctor. Avoid going outside when it is very hot, cold, or humid. Avoid people who have a sickness you can catch (contagious). Keep all follow-up visits. Contact a doctor if: You cough up more mucus than usual. There is a change in the color or thickness of the mucus. It is harder to breathe than usual. Your breathing is faster than usual. You have trouble sleeping. You need to use your medicines more often than usual. You have trouble doing your normal activities such as getting dressed or walking around the house. Get help right away if: You have shortness of breath while resting. You have shortness of breath that stops you from: Being able to talk. Doing normal activities. Your chest hurts for longer than 5 minutes. Your skin color is more blue than usual. Your pulse oximeter shows that you have low oxygen for longer than 5 minutes. You have a fever. You feel too tired to breathe normally. These symptoms may represent a serious problem that is an emergency. Do not wait to see if the symptoms will go away. Get medical help right away. Call your local emergency services (911 in the [place] Do not drive yourself to the hospital. Summary Chronic obstructive pulmonary disease (COPD) is a long-term lung problem. The way your lungs work will never return to normal. Usually the condition gets worse over time. There are things you can do to keep yourself as healthy as possible. Take over-the-counter and prescription medicines only as told by your doctor. If you smoke, stop. Smoking makes  the problem worse. This information is not intended to replace advice given to you by your health care provider. Make sure you discuss any questions you have with your health care provider. Document Revised: 05/25/2020 Document Reviewed: 05/25/2020 Elsevier Patient Education  [employer].

## 2022-05-17 NOTE — Telephone Encounter (Signed)
[name],   Please note message from pharmacy because [name] is costing too much   Test claims to the insurance shows that both options for Spiriva are not covered by the patients insurance.  Incruse shows covered for a co-pay of $15.00.

## 2022-05-17 NOTE — Assessment & Plan Note (Addendum)
-    Following with lung cancer screening program. LDCT 04/17/22 showed lung RADS 2, benign appearance or behavior. Pulmonary nodules measure 5.63mm or less. No new or suspicious pulmonary nodules. Continue annual CT imaging.

## 2022-05-18 ENCOUNTER — Other Ambulatory Visit (HOSPITAL_COMMUNITY): Payer: Self-pay

## 2022-05-18 ENCOUNTER — Telehealth: Payer: Self-pay | Admitting: Primary Care

## 2022-05-18 MED ORDER — INCRUSE ELLIPTA 62.5 MCG/ACT IN AEPB: 1.0000 | INHALATION_SPRAY | Freq: Every day | RESPIRATORY_TRACT | 4 refills | Status: AC

## 2022-05-18 MED ORDER — UMECLIDINIUM BROMIDE 62.5 MCG/ACT IN AEPB: 1.0000 | INHALATION_SPRAY | Freq: Every day | RESPIRATORY_TRACT | Status: DC

## 2022-05-18 NOTE — Telephone Encounter (Signed)
Called patient but she did not answer. Left message for her to call back. I have sent her Incruse to the pharmacy.

## 2022-05-18 NOTE — Telephone Encounter (Signed)
Called and spoke with patient. Patient verbalized understanding. Patient verified her pharmacy. Rx has been sent to the pharmacy.   Nothing further needed.

## 2022-05-19 ENCOUNTER — Other Ambulatory Visit (HOSPITAL_COMMUNITY): Payer: Self-pay

## 2022-05-22 ENCOUNTER — Other Ambulatory Visit (HOSPITAL_COMMUNITY): Payer: Self-pay

## 2022-05-23 ENCOUNTER — Other Ambulatory Visit: Payer: Self-pay | Admitting: *Deleted

## 2022-05-23 DIAGNOSIS — J4541 Moderate persistent asthma with (acute) exacerbation: Secondary | ICD-10-CM

## 2022-05-23 MED ORDER — ALBUTEROL SULFATE HFA 108 (90 BASE) MCG/ACT IN AERS: 1.0000 | INHALATION_SPRAY | Freq: Four times a day (QID) | RESPIRATORY_TRACT | 3 refills | Status: DC | PRN

## 2022-06-30 ENCOUNTER — Inpatient Hospital Stay (HOSPITAL_BASED_OUTPATIENT_CLINIC_OR_DEPARTMENT_OTHER): Payer: Commercial Managed Care - HMO | Admitting: Hematology and Oncology

## 2022-06-30 ENCOUNTER — Inpatient Hospital Stay: Payer: Commercial Managed Care - HMO | Attending: Hematology and Oncology

## 2022-06-30 ENCOUNTER — Encounter: Payer: Self-pay | Admitting: Hematology and Oncology

## 2022-06-30 ENCOUNTER — Other Ambulatory Visit: Payer: Self-pay

## 2022-06-30 VITALS — BP 166/87 | HR 113 | Temp 97.8°F | Resp 18 | Ht 63.0 in | Wt 133.4 lb

## 2022-06-30 DIAGNOSIS — D500 Iron deficiency anemia secondary to blood loss (chronic): Secondary | ICD-10-CM | POA: Insufficient documentation

## 2022-06-30 DIAGNOSIS — D509 Iron deficiency anemia, unspecified: Secondary | ICD-10-CM

## 2022-06-30 DIAGNOSIS — M549 Dorsalgia, unspecified: Secondary | ICD-10-CM | POA: Diagnosis not present

## 2022-06-30 LAB — IRON AND IRON BINDING CAPACITY (CC-WL,HP ONLY)
Iron: 21 ug/dL — ABNORMAL LOW (ref 28–170)
Saturation Ratios: 4 % — ABNORMAL LOW (ref 10.4–31.8)
TIBC: 536 ug/dL — ABNORMAL HIGH (ref 250–450)
UIBC: 515 ug/dL — ABNORMAL HIGH (ref 148–442)

## 2022-06-30 LAB — CBC WITH DIFFERENTIAL (CANCER CENTER ONLY)
Abs Immature Granulocytes: 0.02 10*3/uL (ref 0.00–0.07)
Basophils Absolute: 0.1 10*3/uL (ref 0.0–0.1)
Basophils Relative: 1 %
Eosinophils Absolute: 0.2 10*3/uL (ref 0.0–0.5)
Eosinophils Relative: 3 %
HCT: 35.7 % — ABNORMAL LOW (ref 36.0–46.0)
Hemoglobin: 11.9 g/dL — ABNORMAL LOW (ref 12.0–15.0)
Immature Granulocytes: 0 %
Lymphocytes Relative: 27 %
Lymphs Abs: 2.1 10*3/uL (ref 0.7–4.0)
MCH: 32.3 pg (ref 26.0–34.0)
MCHC: 33.3 g/dL (ref 30.0–36.0)
MCV: 97 fL (ref 80.0–100.0)
Monocytes Absolute: 0.6 10*3/uL (ref 0.1–1.0)
Monocytes Relative: 8 %
Neutro Abs: 4.5 10*3/uL (ref 1.7–7.7)
Neutrophils Relative %: 61 %
Platelet Count: 377 10*3/uL (ref 150–400)
RBC: 3.68 MIL/uL — ABNORMAL LOW (ref 3.87–5.11)
RDW: 13.1 % (ref 11.5–15.5)
WBC Count: 7.5 10*3/uL (ref 4.0–10.5)
nRBC: 0 % (ref 0.0–0.2)

## 2022-06-30 LAB — FERRITIN: Ferritin: 7 ng/mL — ABNORMAL LOW (ref 11–307)

## 2022-06-30 NOTE — Assessment & Plan Note (Addendum)
She has history of known AVM Due to her severe musculoskeletal pain, the patient restarted taking ibuprofen recently and I suspect she might have intermittent GI bleed She is somewhat symptomatic from her anemia I recommend 2 doses of intravenous iron sucrose and plan to see her again in the summer for further follow-up I recommend the patient to refrain from taking NSAID if possible  The most likely cause of her anemia is due to chronic blood loss/malabsorption syndrome. We discussed some of the risks, benefits, and alternatives of intravenous iron infusions. The patient is symptomatic from anemia and the iron level is critically low. She tolerated oral iron supplement poorly and desires to achieved higher levels of iron faster for adequate hematopoesis. Some of the side-effects to be expected including risks of infusion reactions, phlebitis, headaches, nausea and fatigue.  The patient is willing to proceed. Patient education material was dispensed.  Goal is to keep ferritin level greater than 50 and resolution of anemia

## 2022-06-30 NOTE — Progress Notes (Signed)
[facility] OFFICE PROGRESS NOTE  Libby Maw, MD  ASSESSMENT & PLAN:  Iron deficiency anemia She has history of known AVM Due to her severe musculoskeletal pain, the patient restarted taking ibuprofen recently and I suspect she might have intermittent GI bleed She is somewhat symptomatic from her anemia I recommend 2 doses of intravenous iron sucrose and plan to see her again in the summer for further follow-up I recommend the patient to refrain from taking NSAID if possible  The most likely cause of her anemia is due to chronic blood loss/malabsorption syndrome. We discussed some of the risks, benefits, and alternatives of intravenous iron infusions. The patient is symptomatic from anemia and the iron level is critically low. She tolerated oral iron supplement poorly and desires to achieved higher levels of iron faster for adequate hematopoesis. Some of the side-effects to be expected including risks of infusion reactions, phlebitis, headaches, nausea and fatigue.  The patient is willing to proceed. Patient education material was dispensed.  Goal is to keep ferritin level greater than 50 and resolution of anemia   Orders Placed This Encounter  Procedures   Iron and Iron Binding Capacity ([facility] only)    Standing Status:   Future    Standing Expiration Date:   07/01/2023   Ferritin    Standing Status:   Future    Standing Expiration Date:   06/30/2023   CBC with Differential ([facility] Only)    Standing Status:   Future    Standing Expiration Date:   07/01/2023    The total time spent in the appointment was 25 minutes encounter with patients including review of chart and various tests results, discussions about plan of care and coordination of care plan   All questions were answered. The patient knows to call the clinic with any problems, questions or concerns. No barriers to learning was detected.    Heath Lark, MD 12/1/202311:41 AM  INTERVAL  HISTORY: Valerie Russell 61 y.o. female returns for further follow-up and evaluation for iron deficiency anemia She started to complain of fatigue recently She is noted to take ibuprofen intermittently for severe back pain The patient denies any recent signs or symptoms of bleeding such as spontaneous epistaxis, hematuria or hematochezia.  SUMMARY OF HEMATOLOGIC HISTORY:  She was found to have abnormal CBC from blood work I have reviewed her CBC dated back to 2021 She used to have history of normal hemoglobin but starting in 2021, she become anemic may have microcytosis She had numerous endoscopy evaluation as listed: - Colonoscopy (02/2020): 3 subcentimeter adenomas in the descending/ascending colon, 8 mm TA in sigmoid, 6 smaller sigmoid hyperplastic polyps.  25 mm polyp in the proximal rectum not resected in favor of EMR at the hospital.  Single small cecal AVM.  Internal hemorrhoids, sigmoid diverticulosis.  Normal TI - Colonoscopy (04/2020): 30 mm tubular adenoma with focal high grade dysplasia via piecemeal technique EMR.  Sigmoid diverticulosis - EGD (04/2020): 3 cm HH, otherwise normal - Colonoscopy (11/2020): Small cecal AVM, 4 sigmoid hyperplastic polyps ranging 1-3 mm (path:), 3 benign rectal hyperplastic polyps.  Post polypectomy scar in rectum (biopsied: Normal, benign tissue).  Sigmoid diverticulosis, medium sized grade 2 internal hemorrhoids.   She hasrecent chest pain on exertion, shortness of breath on minimal exertion, pre-syncopal episodes, palpitations, leg cramps and fatigue. She had not noticed any recent bleeding such as epistaxis, hematuria or hematochezia The patient takes Aleve regularly for chronic back pain. She is not on  antiplatelets agents. Her last colonoscopy was last year She had no prior history or diagnosis of cancer but but have history of abnormal Pap smear, treated with laser therapy. Her age appropriate screening programs are up-to-date. She denies any pica  and eats a variety of diet. She donated blood once but has never received blood transfusion The patient was prescribed oral iron supplements and she takes 1 daily for 2 years without success of improving her blood count She stated that the oral iron supplement cause constipation and bloating In April 2023, she received 3 doses of intravenous iron sucrose with resolution of anemia   I have reviewed the past medical history, past surgical history, social history and family history with the patient and they are unchanged from previous note.  ALLERGIES:  has No Known Allergies.  MEDICATIONS:  Current Outpatient Medications  Medication Sig Dispense Refill   albuterol (VENTOLIN HFA) 108 (90 Base) MCG/ACT inhaler Inhale 1-2 puffs into the lungs every 6 (six) hours as needed for wheezing or shortness of breath. 18 g 3   cetirizine (ZYRTEC) 10 MG tablet Take 10 mg by mouth daily.     esomeprazole (NEXIUM) 20 MG capsule Take 20 mg by mouth daily at 12 noon.     folic acid (FOLVITE) 1 MG tablet Take 1 mg by mouth daily.     lisinopril (ZESTRIL) 20 MG tablet Take 1 tablet by mouth once daily 90 tablet 0   Magnesium 500 MG TABS Take 500 mg by mouth daily.     Multiple Vitamin (MULTIVITAMIN PO) Take 1 tablet by mouth daily.     umeclidinium bromide (INCRUSE ELLIPTA) 62.5 MCG/ACT AEPB Inhale 1 puff into the lungs daily. 1 each 4   vitamin B-12 (CYANOCOBALAMIN) 1000 MCG tablet Take 1,000 mcg by mouth daily.     No current facility-administered medications for this visit.     REVIEW OF SYSTEMS:   Constitutional: Denies fevers, chills or night sweats Eyes: Denies blurriness of vision Ears, nose, mouth, throat, and face: Denies mucositis or sore throat Respiratory: Denies cough, dyspnea or wheezes Cardiovascular: Denies palpitation, chest discomfort or lower extremity swelling Gastrointestinal:  Denies nausea, heartburn or change in bowel habits Skin: Denies abnormal skin rashes Lymphatics: Denies  new lymphadenopathy or easy bruising Neurological:Denies numbness, tingling or new weaknesses Behavioral/Psych: Mood is stable, no new changes  All other systems were reviewed with the patient and are negative.  PHYSICAL EXAMINATION: ECOG PERFORMANCE STATUS: 1 - Symptomatic but completely ambulatory  Vitals:   06/30/22 1108  BP: (!) 166/87  Pulse: (!) 113  Resp: 18  Temp: 97.8 F (36.6 C)  SpO2: 100%   Filed Weights   06/30/22 1108  Weight: 133 lb 6.4 oz (60.5 kg)    GENERAL:alert, no distress and comfortable  NEURO: alert & oriented x 3 with fluent speech, no focal motor/sensory deficits  LABORATORY DATA:  I have reviewed the data as listed     Component Value Date/Time   NA 140 12/23/2019 1047   K 4.7 12/23/2019 1047   CL 107 12/23/2019 1047   CO2 26 12/23/2019 1047   GLUCOSE 107 (H) 12/23/2019 1047   BUN 12 12/23/2019 1047   CREATININE 0.85 12/23/2019 1047   CALCIUM 9.5 12/23/2019 1047   PROT 7.4 12/23/2019 1047   ALBUMIN 4.4 12/23/2019 1047   AST 19 12/23/2019 1047   ALT 17 12/23/2019 1047   ALKPHOS 121 (H) 12/23/2019 1047   BILITOT 0.3 12/23/2019 1047   GFRNONAA >60 08/23/2015 1336  GFRAA >60 08/23/2015 1336    No results found for: "SPEP", "UPEP"  Lab Results  Component Value Date   WBC 7.5 06/30/2022   NEUTROABS 4.5 06/30/2022   HGB 11.9 (L) 06/30/2022   HCT 35.7 (L) 06/30/2022   MCV 97.0 06/30/2022   PLT 377 06/30/2022      Chemistry      Component Value Date/Time   NA 140 12/23/2019 1047   K 4.7 12/23/2019 1047   CL 107 12/23/2019 1047   CO2 26 12/23/2019 1047   BUN 12 12/23/2019 1047   CREATININE 0.85 12/23/2019 1047      Component Value Date/Time   CALCIUM 9.5 12/23/2019 1047   ALKPHOS 121 (H) 12/23/2019 1047   AST 19 12/23/2019 1047   ALT 17 12/23/2019 1047   BILITOT 0.3 12/23/2019 1047

## 2022-07-03 ENCOUNTER — Telehealth: Payer: Self-pay

## 2022-07-03 NOTE — Telephone Encounter (Signed)
She called to request the office fax recent labs and office notes to Disability determination service at [phone], case# [id number], received fax confirmation.

## 2022-07-07 ENCOUNTER — Other Ambulatory Visit: Payer: Self-pay | Admitting: Family Medicine

## 2022-07-07 DIAGNOSIS — I10 Essential (primary) hypertension: Secondary | ICD-10-CM

## 2022-07-14 ENCOUNTER — Other Ambulatory Visit: Payer: Self-pay

## 2022-07-14 ENCOUNTER — Inpatient Hospital Stay: Payer: Commercial Managed Care - HMO

## 2022-07-14 VITALS — BP 151/85 | HR 94 | Temp 98.1°F | Resp 18

## 2022-07-14 DIAGNOSIS — D500 Iron deficiency anemia secondary to blood loss (chronic): Secondary | ICD-10-CM | POA: Diagnosis not present

## 2022-07-14 DIAGNOSIS — D509 Iron deficiency anemia, unspecified: Secondary | ICD-10-CM

## 2022-07-14 MED ORDER — SODIUM CHLORIDE 0.9 % IV SOLN: Freq: Once | INTRAVENOUS | Status: AC

## 2022-07-14 MED ORDER — SODIUM CHLORIDE 0.9 % IV SOLN
400.0000 mg | Freq: Once | INTRAVENOUS | Status: AC
  Administered 2022-07-14: 400 mg via INTRAVENOUS
  Filled 2022-07-14: qty 20

## 2022-07-14 NOTE — Progress Notes (Signed)
Patient declined to stay for 30 minute post observation. VSS and ambulatory at discharge.  

## 2022-07-14 NOTE — Patient Instructions (Signed)

## 2022-07-21 ENCOUNTER — Encounter: Payer: Self-pay | Admitting: Hematology and Oncology

## 2022-07-21 ENCOUNTER — Inpatient Hospital Stay: Payer: Commercial Managed Care - HMO

## 2022-07-21 ENCOUNTER — Other Ambulatory Visit: Payer: Self-pay

## 2022-07-21 VITALS — BP 129/79 | HR 92 | Temp 97.8°F | Resp 16 | Ht 63.0 in | Wt 131.8 lb

## 2022-07-21 DIAGNOSIS — D509 Iron deficiency anemia, unspecified: Secondary | ICD-10-CM

## 2022-07-21 DIAGNOSIS — D500 Iron deficiency anemia secondary to blood loss (chronic): Secondary | ICD-10-CM | POA: Diagnosis not present

## 2022-07-21 MED ORDER — SODIUM CHLORIDE 0.9 % IV SOLN: Freq: Once | INTRAVENOUS | Status: AC

## 2022-07-21 MED ORDER — SODIUM CHLORIDE 0.9 % IV SOLN
400.0000 mg | Freq: Once | INTRAVENOUS | Status: AC
  Administered 2022-07-21: 400 mg via INTRAVENOUS
  Filled 2022-07-21: qty 20

## 2022-07-21 NOTE — Patient Instructions (Signed)

## 2022-07-21 NOTE — Progress Notes (Signed)
Pt observed for 15 minutes post Venofer infusion. Pt tolerated trtmt well w/out incident. VSS at discharge.  Ambulatory to lobby.

## 2022-11-26 ENCOUNTER — Other Ambulatory Visit: Payer: Self-pay | Admitting: Primary Care

## 2022-11-26 DIAGNOSIS — J4541 Moderate persistent asthma with (acute) exacerbation: Secondary | ICD-10-CM

## 2022-12-28 ENCOUNTER — Telehealth: Payer: Self-pay | Admitting: Primary Care

## 2022-12-28 NOTE — Telephone Encounter (Signed)
Pt was issued a Zpack and Pred by her [employer] [facility] last month. The [facility] told her to ask Ms. Clent Ridges if she could also get some more Pred sent in for her to have on hand. She may need advice on how to take it. She explained to me if she was wheezing she could take one and it would make her feel better and breath.  Pls call @ [phone]  Pharm: [facility] [address]

## 2023-01-01 NOTE — Telephone Encounter (Signed)
Pt will need to be seen for a visit prior to being able to get any prednisone or abx.   Attempted to call pt but unable to reach. Left pt a detailed message to call the office to schedule an appt.

## 2023-01-02 ENCOUNTER — Encounter: Payer: Self-pay | Admitting: Hematology and Oncology

## 2023-01-05 ENCOUNTER — Ambulatory Visit: Payer: Commercial Managed Care - HMO | Admitting: Primary Care

## 2023-01-19 ENCOUNTER — Inpatient Hospital Stay: Payer: Medicaid Other | Attending: Hematology and Oncology

## 2023-01-19 ENCOUNTER — Inpatient Hospital Stay (HOSPITAL_BASED_OUTPATIENT_CLINIC_OR_DEPARTMENT_OTHER): Payer: Medicaid Other | Admitting: Hematology and Oncology

## 2023-01-19 ENCOUNTER — Telehealth: Payer: Self-pay | Admitting: Family Medicine

## 2023-01-19 ENCOUNTER — Encounter: Payer: Self-pay | Admitting: Hematology and Oncology

## 2023-01-19 ENCOUNTER — Other Ambulatory Visit: Payer: Self-pay

## 2023-01-19 VITALS — BP 176/99 | HR 98 | Temp 97.9°F | Resp 18 | Ht 63.0 in | Wt 129.8 lb

## 2023-01-19 DIAGNOSIS — K921 Melena: Secondary | ICD-10-CM | POA: Diagnosis not present

## 2023-01-19 DIAGNOSIS — R0602 Shortness of breath: Secondary | ICD-10-CM | POA: Diagnosis not present

## 2023-01-19 DIAGNOSIS — R252 Cramp and spasm: Secondary | ICD-10-CM | POA: Insufficient documentation

## 2023-01-19 DIAGNOSIS — K909 Intestinal malabsorption, unspecified: Secondary | ICD-10-CM | POA: Insufficient documentation

## 2023-01-19 DIAGNOSIS — R079 Chest pain, unspecified: Secondary | ICD-10-CM | POA: Diagnosis not present

## 2023-01-19 DIAGNOSIS — D509 Iron deficiency anemia, unspecified: Secondary | ICD-10-CM | POA: Insufficient documentation

## 2023-01-19 DIAGNOSIS — R5383 Other fatigue: Secondary | ICD-10-CM | POA: Insufficient documentation

## 2023-01-19 LAB — IRON AND IRON BINDING CAPACITY (CC-WL,HP ONLY)
Iron: 20 ug/dL — ABNORMAL LOW (ref 28–170)
Saturation Ratios: 4 % — ABNORMAL LOW (ref 10.4–31.8)
TIBC: 470 ug/dL — ABNORMAL HIGH (ref 250–450)
UIBC: 450 ug/dL — ABNORMAL HIGH (ref 148–442)

## 2023-01-19 LAB — CBC WITH DIFFERENTIAL (CANCER CENTER ONLY)
Abs Immature Granulocytes: 0.03 10*3/uL (ref 0.00–0.07)
Basophils Absolute: 0.1 10*3/uL (ref 0.0–0.1)
Basophils Relative: 1 %
Eosinophils Absolute: 0.2 10*3/uL (ref 0.0–0.5)
Eosinophils Relative: 3 %
HCT: 30.7 % — ABNORMAL LOW (ref 36.0–46.0)
Hemoglobin: 10.3 g/dL — ABNORMAL LOW (ref 12.0–15.0)
Immature Granulocytes: 0 %
Lymphocytes Relative: 24 %
Lymphs Abs: 1.7 10*3/uL (ref 0.7–4.0)
MCH: 32.6 pg (ref 26.0–34.0)
MCHC: 33.6 g/dL (ref 30.0–36.0)
MCV: 97.2 fL (ref 80.0–100.0)
Monocytes Absolute: 0.6 10*3/uL (ref 0.1–1.0)
Monocytes Relative: 8 %
Neutro Abs: 4.6 10*3/uL (ref 1.7–7.7)
Neutrophils Relative %: 64 %
Platelet Count: 340 10*3/uL (ref 150–400)
RBC: 3.16 MIL/uL — ABNORMAL LOW (ref 3.87–5.11)
RDW: 12.8 % (ref 11.5–15.5)
WBC Count: 7.2 10*3/uL (ref 4.0–10.5)
nRBC: 0 % (ref 0.0–0.2)

## 2023-01-19 LAB — FERRITIN: Ferritin: 7 ng/mL — ABNORMAL LOW (ref 11–307)

## 2023-01-19 NOTE — Telephone Encounter (Signed)
Please advise message below  °

## 2023-01-19 NOTE — Telephone Encounter (Signed)
Okay with Dr. Doreene Burke

## 2023-01-19 NOTE — Assessment & Plan Note (Signed)
She has history of known AVM She is somewhat symptomatic from her anemia I recommend 2 doses of intravenous iron sucrose and plan to see her again in 5 months for further follow-up  The most likely cause of her anemia is due to chronic blood loss/malabsorption syndrome. We discussed some of the risks, benefits, and alternatives of intravenous iron infusions. The patient is symptomatic from anemia and the iron level is critically low. She tolerated oral iron supplement poorly and desires to achieved higher levels of iron faster for adequate hematopoesis. Some of the side-effects to be expected including risks of infusion reactions, phlebitis, headaches, nausea and fatigue.  The patient is willing to proceed. Patient education material was dispensed.  Goal is to keep ferritin level greater than 50 and resolution of anemia

## 2023-01-19 NOTE — Telephone Encounter (Signed)
Pt is requesting a Transfer of Care -  From:   Joylene Igo, MD @ [facility] @ [address]  To:        Penne Lash, MD @ [facility]  Are both providers okay with this request?  Please advise.  Respectfully,  Isa C.

## 2023-01-19 NOTE — Progress Notes (Signed)
[facility] OFFICE PROGRESS NOTE  [name], MD  ASSESSMENT & PLAN:  Iron deficiency anemia She has history of known AVM She is somewhat symptomatic from her anemia I recommend 2 doses of intravenous iron sucrose and plan to see her again in 5 months for further follow-up  The most likely cause of her anemia is due to chronic blood loss/malabsorption syndrome. We discussed some of the risks, benefits, and alternatives of intravenous iron infusions. The patient is symptomatic from anemia and the iron level is critically low. She tolerated oral iron supplement poorly and desires to achieved higher levels of iron faster for adequate hematopoesis. Some of the side-effects to be expected including risks of infusion reactions, phlebitis, headaches, nausea and fatigue.  The patient is willing to proceed. Patient education material was dispensed.  Goal is to keep ferritin level greater than 50 and resolution of anemia   Orders Placed This Encounter  Procedures   Iron and Iron Binding Capacity ([facility] only)    Standing Status:   Future    Standing Expiration Date:   01/19/2024   CBC with Differential ([facility] Only)    Standing Status:   Future    Standing Expiration Date:   01/19/2024   Ferritin    Standing Status:   Future    Standing Expiration Date:   01/19/2024    The total time spent in the appointment was 20 minutes encounter with patients including review of chart and various tests results, discussions about plan of care and coordination of care plan   All questions were answered. The patient knows to call the clinic with any problems, questions or concerns. No barriers to learning was detected.    Artis Delay, MD 6/21/202410:40 AM  INTERVAL HISTORY: Valerie Russell 62 y.o. female returns for further evaluation for recurrent iron deficiency anemia She has noticed some intermittent melena She complains of fatigue  SUMMARY OF HEMATOLOGIC  HISTORY:  She was found to have abnormal CBC from blood work I have reviewed her CBC dated back to 2021 She used to have history of normal hemoglobin but starting in 2021, she become anemic may have microcytosis She had numerous endoscopy evaluation as listed: - Colonoscopy (02/2020): 3 subcentimeter adenomas in the descending/ascending colon, 8 mm TA in sigmoid, 6 smaller sigmoid hyperplastic polyps.  25 mm polyp in the proximal rectum not resected in favor of EMR at the hospital.  Single small cecal AVM.  Internal hemorrhoids, sigmoid diverticulosis.  Normal TI - Colonoscopy (04/2020): 30 mm tubular adenoma with focal high grade dysplasia via piecemeal technique EMR.  Sigmoid diverticulosis - EGD (04/2020): 3 cm HH, otherwise normal - Colonoscopy (11/2020): Small cecal AVM, 4 sigmoid hyperplastic polyps ranging 1-3 mm (path:), 3 benign rectal hyperplastic polyps.  Post polypectomy scar in rectum (biopsied: Normal, benign tissue).  Sigmoid diverticulosis, medium sized grade 2 internal hemorrhoids.   She hasrecent chest pain on exertion, shortness of breath on minimal exertion, pre-syncopal episodes, palpitations, leg cramps and fatigue. She had not noticed any recent bleeding such as epistaxis, hematuria or hematochezia The patient takes Aleve regularly for chronic back pain. She is not on antiplatelets agents. Her last colonoscopy was last year She had no prior history or diagnosis of cancer but but have history of abnormal Pap smear, treated with laser therapy. Her age appropriate screening programs are up-to-date. She denies any pica and eats a variety of diet. She donated blood once but has never received blood transfusion The patient  was prescribed oral iron supplements and she takes 1 daily for 2 years without success of improving her blood count She stated that the oral iron supplement cause constipation and bloating In April 2023, she received 3 doses of intravenous iron sucrose with  resolution of anemia She received 2 more doses of intravenous iron sucrose in December 2023  I have reviewed the past medical history, past surgical history, social history and family history with the patient and they are unchanged from previous note.  ALLERGIES:  has No Known Allergies.  MEDICATIONS:  Current Outpatient Medications  Medication Sig Dispense Refill   cetirizine (ZYRTEC) 10 MG tablet Take 10 mg by mouth daily.     esomeprazole (NEXIUM) 20 MG capsule Take 20 mg by mouth daily at 12 noon.     folic acid (FOLVITE) 1 MG tablet Take 1 mg by mouth daily.     lisinopril (ZESTRIL) 20 MG tablet Take 1 tablet by mouth once daily 30 tablet 1   Magnesium 500 MG TABS Take 500 mg by mouth daily.     Multiple Vitamin (MULTIVITAMIN PO) Take 1 tablet by mouth daily.     umeclidinium bromide (INCRUSE ELLIPTA) 62.5 MCG/ACT AEPB Inhale 1 puff into the lungs daily. 1 each 4   VENTOLIN HFA 108 (90 Base) MCG/ACT inhaler INHALE 1 TO 2 PUFFS INTO LUNGS EVERY 6 HOURS AS NEEDED FOR WHEEZING OR SHORTNESS OF BREATH 18 g 1   vitamin B-12 (CYANOCOBALAMIN) 1000 MCG tablet Take 1,000 mcg by mouth daily.     No current facility-administered medications for this visit.     REVIEW OF SYSTEMS:   Constitutional: Denies fevers, chills or night sweats Eyes: Denies blurriness of vision Ears, nose, mouth, throat, and face: Denies mucositis or sore throat Respiratory: Denies cough, dyspnea or wheezes Cardiovascular: Denies palpitation, chest discomfort or lower extremity swelling Gastrointestinal:  Denies nausea, heartburn or change in bowel habits Skin: Denies abnormal skin rashes Lymphatics: Denies new lymphadenopathy or easy bruising Neurological:Denies numbness, tingling or new weaknesses Behavioral/Psych: Mood is stable, no new changes  All other systems were reviewed with the patient and are negative.  PHYSICAL EXAMINATION: ECOG PERFORMANCE STATUS: 1 - Symptomatic but completely ambulatory  Vitals:    01/19/23 1006  BP: (!) 176/99  Pulse: 98  Resp: 18  Temp: 97.9 F (36.6 C)  SpO2: 100%   Filed Weights   01/19/23 1006  Weight: 129 lb 12.8 oz (58.9 kg)    GENERAL:alert, no distress and comfortable  NEURO: alert & oriented x 3 with fluent speech, no focal motor/sensory deficits  LABORATORY DATA:  I have reviewed the data as listed     Component Value Date/Time   NA 140 12/23/2019 1047   K 4.7 12/23/2019 1047   CL 107 12/23/2019 1047   CO2 26 12/23/2019 1047   GLUCOSE 107 (H) 12/23/2019 1047   BUN 12 12/23/2019 1047   CREATININE 0.85 12/23/2019 1047   CALCIUM 9.5 12/23/2019 1047   PROT 7.4 12/23/2019 1047   ALBUMIN 4.4 12/23/2019 1047   AST 19 12/23/2019 1047   ALT 17 12/23/2019 1047   ALKPHOS 121 (H) 12/23/2019 1047   BILITOT 0.3 12/23/2019 1047   GFRNONAA >60 08/23/2015 1336   GFRAA >60 08/23/2015 1336    No results found for: "SPEP", "UPEP"  Lab Results  Component Value Date   WBC 7.2 01/19/2023   NEUTROABS 4.6 01/19/2023   HGB 10.3 (L) 01/19/2023   HCT 30.7 (L) 01/19/2023   MCV 97.2 01/19/2023  PLT 340 01/19/2023      Chemistry      Component Value Date/Time   NA 140 12/23/2019 1047   K 4.7 12/23/2019 1047   CL 107 12/23/2019 1047   CO2 26 12/23/2019 1047   BUN 12 12/23/2019 1047   CREATININE 0.85 12/23/2019 1047      Component Value Date/Time   CALCIUM 9.5 12/23/2019 1047   ALKPHOS 121 (H) 12/23/2019 1047   AST 19 12/23/2019 1047   ALT 17 12/23/2019 1047   BILITOT 0.3 12/23/2019 1047

## 2023-01-22 NOTE — Telephone Encounter (Signed)
Pt scheduled for 02/28/23.

## 2023-01-26 ENCOUNTER — Inpatient Hospital Stay: Payer: Medicaid Other

## 2023-01-26 ENCOUNTER — Encounter: Payer: Self-pay | Admitting: Hematology and Oncology

## 2023-01-28 ENCOUNTER — Other Ambulatory Visit: Payer: Self-pay | Admitting: Acute Care

## 2023-01-28 DIAGNOSIS — Z87891 Personal history of nicotine dependence: Secondary | ICD-10-CM

## 2023-01-28 DIAGNOSIS — Z122 Encounter for screening for malignant neoplasm of respiratory organs: Secondary | ICD-10-CM

## 2023-01-28 DIAGNOSIS — F17210 Nicotine dependence, cigarettes, uncomplicated: Secondary | ICD-10-CM

## 2023-02-05 ENCOUNTER — Ambulatory Visit (HOSPITAL_BASED_OUTPATIENT_CLINIC_OR_DEPARTMENT_OTHER): Payer: Commercial Managed Care - HMO | Admitting: Primary Care

## 2023-02-06 ENCOUNTER — Other Ambulatory Visit: Payer: Self-pay

## 2023-02-06 ENCOUNTER — Inpatient Hospital Stay: Payer: Medicaid Other | Attending: Hematology and Oncology

## 2023-02-06 VITALS — BP 170/82 | HR 84 | Temp 98.1°F | Resp 18

## 2023-02-06 DIAGNOSIS — D509 Iron deficiency anemia, unspecified: Secondary | ICD-10-CM | POA: Diagnosis present

## 2023-02-06 MED ORDER — SODIUM CHLORIDE 0.9 % IV SOLN
400.0000 mg | Freq: Once | INTRAVENOUS | Status: AC
  Administered 2023-02-06: 400 mg via INTRAVENOUS
  Filled 2023-02-06: qty 400

## 2023-02-06 MED ORDER — SODIUM CHLORIDE 0.9 % IV SOLN: Freq: Once | INTRAVENOUS | Status: AC

## 2023-02-06 NOTE — Progress Notes (Signed)
Pt declined post observation for venofer infusion. Pt tolerated tx well without incident. VSS at d/c.

## 2023-02-20 ENCOUNTER — Other Ambulatory Visit: Payer: Self-pay

## 2023-02-20 ENCOUNTER — Inpatient Hospital Stay: Payer: Medicaid Other

## 2023-02-20 VITALS — BP 165/78 | HR 99 | Temp 98.6°F | Resp 18 | Ht 63.0 in | Wt 130.8 lb

## 2023-02-20 DIAGNOSIS — D509 Iron deficiency anemia, unspecified: Secondary | ICD-10-CM

## 2023-02-20 MED ORDER — SODIUM CHLORIDE 0.9 % IV SOLN: Freq: Once | INTRAVENOUS | Status: AC

## 2023-02-20 MED ORDER — SODIUM CHLORIDE 0.9 % IV SOLN
400.0000 mg | Freq: Once | INTRAVENOUS | Status: AC
  Administered 2023-02-20: 400 mg via INTRAVENOUS
  Filled 2023-02-20: qty 400

## 2023-02-20 NOTE — Patient Instructions (Signed)
Iron Sucrose Injection What is this medication? IRON SUCROSE (EYE ern SOO krose) treats low levels of iron (iron deficiency anemia) in people with kidney disease. Iron is a mineral that plays an important role in making red blood cells, which carry oxygen from your lungs to the rest of your body. This medicine may be used for other purposes; ask your health care provider or pharmacist if you have questions. COMMON BRAND NAME(S): Venofer What should I tell my care team before I take this medication? They need to know if you have any of these conditions: Anemia not caused by low iron levels Heart disease High levels of iron in the blood Kidney disease Liver disease An unusual or allergic reaction to iron, other medications, foods, dyes, or preservatives Pregnant or trying to get pregnant Breastfeeding How should I use this medication? This medication is for infusion into a vein. It is given in a hospital or clinic setting. Talk to your care team about the use of this medication in children. While this medication may be prescribed for children as  as 2 years for selected conditions, precautions do apply. Overdosage: If you think you have taken too much of this medicine contact a poison control center or emergency room at once. NOTE: This medicine is only for you. Do not share this medicine with others. What if I miss a dose? Keep appointments for follow-up doses. It is important not to miss your dose. Call your care team if you are unable to keep an appointment. What may interact with this medication? Do not take this medication with any of the following: Deferoxamine Dimercaprol Other iron products This medication may also interact with the following: Chloramphenicol Deferasirox This list may not describe all possible interactions. Give your health care provider a list of all the medicines, herbs, non-prescription drugs, or dietary supplements you use. Also tell them if you smoke,  drink alcohol, or use illegal drugs. Some items may interact with your medicine. What should I watch for while using this medication? Visit your care team regularly. Tell your care team if your symptoms do not start to get better or if they get worse. You may need blood work done while you are taking this medication. You may need to follow a special diet. Talk to your care team. Foods that contain iron include: whole grains/cereals, dried fruits, beans, or peas, leafy green vegetables, and organ meats (liver, kidney). What side effects may I notice from receiving this medication? Side effects that you should report to your care team as soon as possible: Allergic reactions--skin rash, itching, hives, swelling of the face, lips, tongue, or throat Low blood pressure--dizziness, feeling faint or lightheaded, blurry vision Shortness of breath Side effects that usually do not require medical attention (report to your care team if they continue or are bothersome): Flushing Headache Joint pain Muscle pain Nausea Pain, redness, or irritation at injection site This list may not describe all possible side effects. Call your doctor for medical advice about side effects. You may report side effects to FDA at 1-800-FDA-1088. Where should I keep my medication? This medication is given in a hospital or clinic. It will not be stored at home. NOTE: This sheet is a summary. It may not cover all possible information. If you have questions about this medicine, talk to your doctor, pharmacist, or health care provider.  2024 Elsevier/Gold Standard (2022-12-22 00:00:00)

## 2023-02-20 NOTE — Progress Notes (Signed)
Pt declined to stay for 30 minute post-iron observation. VSS at discharge. Pt verbalizes understanding to call [facility] with concerns

## 2023-02-26 NOTE — Progress Notes (Deleted)
@Patient  ID: Valerie Russell, female    DOB: Apr 04, 1961, 62 y.o.   MRN: [id number]  No chief complaint on file.   Referring provider: [name]  HPI: 62 year old female, current everyday smoker.  Past medical history significant for hypertension, aortic atherosclerosis, COPD, pulmonary emphysema, reactive airway disease.  Previous [facility] pulmonary encounter:  05/17/2022 Patient presents today for overdue follow-up. She is doing alright. She has a morning cough with mucus production. She experiences dyspnea with exertion. Associated allergy symptoms. She is not currently on Spiriva d/t cost. She uses albuterol 2-3 times a day. She is still smoking but cut back. She tried chantix but did not tolerate.   CAT score- 25 SABA use- 2-3 times daily   02/27/2023- Interim hx  Patient presents today for 6 month         LDCT 04/17/22 showed lung RADS 2, benign appearance or behavior. Biapical pleuroparenchymal scarring. Centrilobular and paraseptal emphysema. Pulmonary nodules measure 5.32mm or less. No new or suspicious pulmonary nodules.      No Known Allergies  Immunization History  Administered Date(s) Administered   Janssen (J&J) SARS-COV-2 Vaccination 05/29/2020   PFIZER(Purple Top)SARS-COV-2 Vaccination 10/10/2019, 11/03/2019   Tdap 07/05/2014, 09/19/2016    Past Medical History:  Diagnosis Date   Allergy    seasonal allergies   Anemia    on meds   Cervical cancer (HCC)    in her 31s pt states, laser surgery to remove   COPD (chronic obstructive pulmonary disease) (HCC)    uses inhaler   Diverticulitis    GERD (gastroesophageal reflux disease)    on meds   Hypertension    on meds   Lung cancer (HCC) 2022   per pt (12/09/2020)   Seasonal allergies     Tobacco History: Social History   Tobacco Use  Smoking Status Every Day   Current packs/day: 0.25   Average packs/day: 0.3 packs/day for 25.0 years (6.3 ttl pk-yrs)   Types: Cigarettes  Smokeless  Tobacco Never   Ready to quit: Not Answered Counseling given: Not Answered   Outpatient Medications Prior to Visit  Medication Sig Dispense Refill   cetirizine (ZYRTEC) 10 MG tablet Take 10 mg by mouth daily.     esomeprazole (NEXIUM) 20 MG capsule Take 20 mg by mouth daily at 12 noon.     folic acid (FOLVITE) 1 MG tablet Take 1 mg by mouth daily.     lisinopril (ZESTRIL) 20 MG tablet Take 1 tablet by mouth once daily 30 tablet 1   Magnesium 500 MG TABS Take 500 mg by mouth daily.     Multiple Vitamin (MULTIVITAMIN PO) Take 1 tablet by mouth daily.     umeclidinium bromide (INCRUSE ELLIPTA) 62.5 MCG/ACT AEPB Inhale 1 puff into the lungs daily. 1 each 4   VENTOLIN HFA 108 (90 Base) MCG/ACT inhaler INHALE 1 TO 2 PUFFS INTO LUNGS EVERY 6 HOURS AS NEEDED FOR WHEEZING OR SHORTNESS OF BREATH 18 g 1   vitamin B-12 (CYANOCOBALAMIN) 1000 MCG tablet Take 1,000 mcg by mouth daily.     No facility-administered medications prior to visit.      Review of Systems  Review of Systems   Physical Exam  There were no vitals taken for this visit. Physical Exam   Lab Results:  CBC    Component Value Date/Time   WBC 7.2 01/19/2023 0937   WBC 4.8 12/27/2021 1139   RBC 3.16 (L) 01/19/2023 0937   HGB 10.3 (L) 01/19/2023 4401  HCT 30.7 (L) 01/19/2023 0937   PLT 340 01/19/2023 0937   MCV 97.2 01/19/2023 0937   MCH 32.6 01/19/2023 0937   MCHC 33.6 01/19/2023 0937   RDW 12.8 01/19/2023 0937   LYMPHSABS 1.7 01/19/2023 0937   MONOABS 0.6 01/19/2023 0937   EOSABS 0.2 01/19/2023 0937   BASOSABS 0.1 01/19/2023 0937    BMET    Component Value Date/Time   NA 140 12/23/2019 1047   K 4.7 12/23/2019 1047   CL 107 12/23/2019 1047   CO2 26 12/23/2019 1047   GLUCOSE 107 (H) 12/23/2019 1047   BUN 12 12/23/2019 1047   CREATININE 0.85 12/23/2019 1047   CALCIUM 9.5 12/23/2019 1047   GFRNONAA >60 08/23/2015 1336   GFRAA >60 08/23/2015 1336    BNP No results found for: "BNP"  ProBNP No  results found for: "PROBNP"  Imaging: No results found.   Assessment & Plan:   No problem-specific Assessment & Plan notes found for this encounter.     Glenford Bayley, NP 02/26/2023

## 2023-02-27 ENCOUNTER — Ambulatory Visit (HOSPITAL_BASED_OUTPATIENT_CLINIC_OR_DEPARTMENT_OTHER): Payer: Commercial Managed Care - HMO | Admitting: Primary Care

## 2023-02-28 ENCOUNTER — Encounter: Payer: Medicaid Other | Admitting: Internal Medicine

## 2023-03-07 ENCOUNTER — Encounter: Payer: Medicaid Other | Admitting: Internal Medicine

## 2023-03-19 ENCOUNTER — Ambulatory Visit: Payer: Commercial Managed Care - HMO | Admitting: Primary Care

## 2023-03-28 ENCOUNTER — Other Ambulatory Visit: Payer: Self-pay | Admitting: Family Medicine

## 2023-03-28 DIAGNOSIS — I10 Essential (primary) hypertension: Secondary | ICD-10-CM

## 2023-04-03 ENCOUNTER — Encounter: Payer: Self-pay | Admitting: Hematology and Oncology

## 2023-04-16 ENCOUNTER — Other Ambulatory Visit: Payer: Self-pay | Admitting: Primary Care

## 2023-04-16 DIAGNOSIS — J4541 Moderate persistent asthma with (acute) exacerbation: Secondary | ICD-10-CM

## 2023-04-19 ENCOUNTER — Inpatient Hospital Stay: Admission: RE | Admit: 2023-04-19 | Payer: Medicaid Other | Source: Ambulatory Visit

## 2023-05-04 ENCOUNTER — Ambulatory Visit: Payer: Medicaid Other | Admitting: Physician Assistant

## 2023-07-05 ENCOUNTER — Emergency Department (HOSPITAL_COMMUNITY)
Admission: EM | Admit: 2023-07-05 | Discharge: 2023-07-05 | Discharge status: Against Medical Advice | Payer: Medicaid Other | Attending: Emergency Medicine | Admitting: Emergency Medicine

## 2023-07-05 ENCOUNTER — Telehealth: Payer: Self-pay

## 2023-07-05 ENCOUNTER — Encounter (HOSPITAL_COMMUNITY): Payer: Self-pay

## 2023-07-05 ENCOUNTER — Other Ambulatory Visit: Payer: Self-pay

## 2023-07-05 ENCOUNTER — Other Ambulatory Visit: Payer: Self-pay | Admitting: *Deleted

## 2023-07-05 ENCOUNTER — Telehealth: Payer: Self-pay | Admitting: Primary Care

## 2023-07-05 DIAGNOSIS — R531 Weakness: Secondary | ICD-10-CM | POA: Diagnosis present

## 2023-07-05 DIAGNOSIS — Z5321 Procedure and treatment not carried out due to patient leaving prior to being seen by health care provider: Secondary | ICD-10-CM | POA: Diagnosis not present

## 2023-07-05 LAB — CBC
HCT: 35.2 % — ABNORMAL LOW (ref 36.0–46.0)
Hemoglobin: 11.4 g/dL — ABNORMAL LOW (ref 12.0–15.0)
MCH: 31.8 pg (ref 26.0–34.0)
MCHC: 32.4 g/dL (ref 30.0–36.0)
MCV: 98.1 fL (ref 80.0–100.0)
Platelets: 360 10*3/uL (ref 150–400)
RBC: 3.59 MIL/uL — ABNORMAL LOW (ref 3.87–5.11)
RDW: 13.3 % (ref 11.5–15.5)
WBC: 6.6 10*3/uL (ref 4.0–10.5)
nRBC: 0 % (ref 0.0–0.2)

## 2023-07-05 LAB — BASIC METABOLIC PANEL
Anion gap: 12 (ref 5–15)
BUN: 29 mg/dL — ABNORMAL HIGH (ref 8–23)
CO2: 25 mmol/L (ref 22–32)
Calcium: 9.8 mg/dL (ref 8.9–10.3)
Chloride: 98 mmol/L (ref 98–111)
Creatinine, Ser: 1.7 mg/dL — ABNORMAL HIGH (ref 0.44–1.00)
GFR, Estimated: 33 mL/min — ABNORMAL LOW (ref >60)
Glucose, Bld: 139 mg/dL — ABNORMAL HIGH (ref 70–99)
Potassium: 4.3 mmol/L (ref 3.5–5.1)
Sodium: 135 mmol/L (ref 135–145)

## 2023-07-05 MED ORDER — ALBUTEROL SULFATE HFA 108 (90 BASE) MCG/ACT IN AERS: 2.0000 | INHALATION_SPRAY | Freq: Four times a day (QID) | RESPIRATORY_TRACT | 0 refills | Status: DC | PRN

## 2023-07-05 MED ORDER — ALBUTEROL SULFATE HFA 108 (90 BASE) MCG/ACT IN AERS: 2.0000 | INHALATION_SPRAY | Freq: Four times a day (QID) | RESPIRATORY_TRACT | 0 refills | Status: AC | PRN

## 2023-07-05 NOTE — ED Triage Notes (Signed)
Patient is here for evaluation of low blood levels. Reports that she is anemic and she feels like her blood levels are low and that she needs an iron transfusion. Reports that she gets iron transfusions about every 4 months, but cannot wait until 12/16 for her next infusion. Pt reports cramping all over and generalized weakness. States she feels like she is going to pass out.

## 2023-07-05 NOTE — Telephone Encounter (Signed)
Patient states needs refill for Ventolin. Pharmacy is [facility]. Patient scheduled 08/30/2023 with Ames Dura NP. Patient out of medication.  Patient phone number is [phone].

## 2023-07-05 NOTE — Telephone Encounter (Signed)
Called and spoke with patient, advised that the refill of her Ventolin (Albuterol) has been sent in to her pharmacy.  Advised that she will need to keep her appointment on 08/30/23 at 11:30 am to get further refills.  Advised to arrive by 11:15 am for check in.  She verbalized understanding.  Nothing further needed.

## 2023-07-05 NOTE — Telephone Encounter (Signed)
Pt called and reports she is "not feeling well at all" In her VM, she states she is "trembling, dizzy, and I feel like I am going to pass out." Pt has appt scheduled for labs and MD visit 12/10. When called back for further info, she states her pulse is 115 and she can "feel my heart beating fast. I feel awful."  She reports she is currently in triage. I advised pt I would make MD aware of ED visit. She verbalized thanks and understanding.

## 2023-07-06 ENCOUNTER — Telehealth: Payer: Self-pay

## 2023-07-06 NOTE — Telephone Encounter (Signed)
Called and given below message. She verbalized understanding. She would like to cancel 12/10 appt with Dr. Bertis Ruddy. She will call PCP office today regarding kidney function. She left the ER after waiting 3 hours and was not seen.

## 2023-07-06 NOTE — Telephone Encounter (Signed)
-----   Message from Artis Delay sent at 07/06/2023 10:32 AM EST ----- She went to ER yesterday because she thought she needs IV iron Her hemoglobin is good but she is in acute renal failure She needs to  see her PCP to address that She can keep her appt with me or cancel it No need lab appt Please call her and let me know

## 2023-07-10 ENCOUNTER — Inpatient Hospital Stay: Payer: Medicaid Other | Admitting: Hematology and Oncology

## 2023-07-10 ENCOUNTER — Inpatient Hospital Stay: Payer: Medicaid Other

## 2023-07-19 ENCOUNTER — Encounter: Payer: Self-pay | Admitting: Hematology and Oncology

## 2023-07-24 ENCOUNTER — Other Ambulatory Visit: Payer: Self-pay | Admitting: Primary Care

## 2023-08-02 ENCOUNTER — Telehealth: Payer: Self-pay

## 2023-08-02 NOTE — Telephone Encounter (Signed)
 Called and left a message asking her to call the office back regarding a request for lab work.

## 2023-08-10 ENCOUNTER — Telehealth: Payer: Self-pay

## 2023-08-10 NOTE — Telephone Encounter (Signed)
 Returned her call. She said that she already got her appts fixed now. She had a conflict.

## 2023-08-14 ENCOUNTER — Other Ambulatory Visit: Payer: Self-pay

## 2023-08-14 ENCOUNTER — Telehealth: Payer: Self-pay

## 2023-08-14 ENCOUNTER — Encounter (HOSPITAL_BASED_OUTPATIENT_CLINIC_OR_DEPARTMENT_OTHER): Payer: Self-pay | Admitting: Emergency Medicine

## 2023-08-14 ENCOUNTER — Emergency Department (HOSPITAL_BASED_OUTPATIENT_CLINIC_OR_DEPARTMENT_OTHER)
Admission: EM | Admit: 2023-08-14 | Discharge: 2023-08-14 | Disposition: A | Discharge status: Home / Self Care | Payer: Medicaid Other | Attending: Emergency Medicine | Admitting: Emergency Medicine

## 2023-08-14 DIAGNOSIS — K625 Hemorrhage of anus and rectum: Secondary | ICD-10-CM | POA: Diagnosis present

## 2023-08-14 DIAGNOSIS — R110 Nausea: Secondary | ICD-10-CM | POA: Insufficient documentation

## 2023-08-14 DIAGNOSIS — Z79899 Other long term (current) drug therapy: Secondary | ICD-10-CM | POA: Insufficient documentation

## 2023-08-14 DIAGNOSIS — R531 Weakness: Secondary | ICD-10-CM | POA: Insufficient documentation

## 2023-08-14 LAB — COMPREHENSIVE METABOLIC PANEL
ALT: 19 U/L (ref 0–44)
AST: 26 U/L (ref 15–41)
Albumin: 4.8 g/dL (ref 3.5–5.0)
Alkaline Phosphatase: 80 U/L (ref 38–126)
Anion gap: 11 (ref 5–15)
BUN: 15 mg/dL (ref 8–23)
CO2: 25 mmol/L (ref 22–32)
Calcium: 10.2 mg/dL (ref 8.9–10.3)
Chloride: 99 mmol/L (ref 98–111)
Creatinine, Ser: 1 mg/dL (ref 0.44–1.00)
GFR, Estimated: >60 mL/min (ref >60)
Glucose, Bld: 81 mg/dL (ref 70–99)
Potassium: 3.9 mmol/L (ref 3.5–5.1)
Sodium: 135 mmol/L (ref 135–145)
Total Bilirubin: 0.3 mg/dL (ref 0.0–1.2)
Total Protein: 7.7 g/dL (ref 6.5–8.1)

## 2023-08-14 LAB — CBC
HCT: 31.7 % — ABNORMAL LOW (ref 36.0–46.0)
Hemoglobin: 10.4 g/dL — ABNORMAL LOW (ref 12.0–15.0)
MCH: 29.5 pg (ref 26.0–34.0)
MCHC: 32.8 g/dL (ref 30.0–36.0)
MCV: 90.1 fL (ref 80.0–100.0)
Platelets: 389 10*3/uL (ref 150–400)
RBC: 3.52 MIL/uL — ABNORMAL LOW (ref 3.87–5.11)
RDW: 13.8 % (ref 11.5–15.5)
WBC: 5.4 10*3/uL (ref 4.0–10.5)
nRBC: 0 % (ref 0.0–0.2)

## 2023-08-14 LAB — OCCULT BLOOD X 1 CARD TO LAB, STOOL: Fecal Occult Bld: NEGATIVE

## 2023-08-14 NOTE — ED Provider Notes (Signed)
 [place] EMERGENCY DEPARTMENT AT [facility] Provider Note   CSN: [id number] Arrival date & time: 08/14/23  1356     History  Chief Complaint  Patient presents with   GI Bleeding    Valerie Russell is a 63 y.o. female.  Patient presents to the ED with complaint of large amount of bright red blood in bowel movement this morning. She has history of IDA with periodic iron  injections. She endorses mild nausea and feeling generally weak. Denies abdominal pain, fever, chills. History of diverticulosis, rectal and sigmoid polyps, internal hemorrhoids.  The history is provided by the patient.  GI Problem This is a new problem. The current episode started 3 to 5 hours ago.       Home Medications Prior to Admission medications   Medication Sig Start Date End Date Taking? Authorizing Provider  albuterol  (VENTOLIN  HFA) 108 (90 Base) MCG/ACT inhaler Inhale 2 puffs into the lungs every 6 (six) hours as needed for wheezing or shortness of breath. F/u for further refills Fill insurance preference. 07/05/23   Hope Almarie ORN, NP  cetirizine (ZYRTEC) 10 MG tablet Take 10 mg by mouth daily.    [provider]  esomeprazole (NEXIUM) 20 MG capsule Take 20 mg by mouth daily at 12 noon.    [provider]  folic acid (FOLVITE) 1 MG tablet Take 1 mg by mouth daily.    [provider]  lisinopril  (ZESTRIL ) 20 MG tablet Take 1 tablet by mouth once daily 07/07/22   Berneta Elsie Sayre, MD  Magnesium 500 MG TABS Take 500 mg by mouth daily.    [provider]  Multiple Vitamin (MULTIVITAMIN PO) Take 1 tablet by mouth daily.    [provider]  umeclidinium bromide  (INCRUSE ELLIPTA ) 62.5 MCG/ACT AEPB Inhale 1 puff into the lungs daily. 05/18/22   Hope Almarie ORN, NP  VENTOLIN  HFA 108 (90 Base) MCG/ACT inhaler INHALE 1 TO 2 PUFFS INTO LUNGS EVERY 6 HOURS AS NEEDED FOR WHEEZING OR SHORTNESS OF BREATH 11/27/22   Hope Almarie ORN, NP  vitamin B-12  (CYANOCOBALAMIN) 1000 MCG tablet Take 1,000 mcg by mouth daily.    [provider]      Allergies    Patient has no known allergies.    Review of Systems   Review of Systems  Gastrointestinal:  Positive for blood in stool.  Neurological:  Positive for weakness.  All other systems reviewed and are negative.   Physical Exam Updated Vital Signs BP (!) 116/96 (BP Location: Left Arm)   Pulse (!) 119   Temp 99.4 F (37.4 C)   Resp 20   SpO2 97%  Physical Exam Vitals and nursing note reviewed.  Constitutional:      Appearance: Normal appearance.  HENT:     Head: Normocephalic.  Cardiovascular:     Rate and Rhythm: Normal rate and regular rhythm.  Pulmonary:     Effort: Pulmonary effort is normal.     Breath sounds: Normal breath sounds.  Abdominal:     General: There is no distension.     Palpations: Abdomen is soft.     Tenderness: There is no abdominal tenderness.  Genitourinary:    Rectum: Normal.  Musculoskeletal:        General: Normal range of motion.  Skin:    General: Skin is warm and dry.  Neurological:     Mental Status: She is alert and oriented to person, place, and time.  Psychiatric:  Mood and Affect: Mood normal.        Behavior: Behavior normal.     ED Results / Procedures / Treatments   Labs (all labs ordered are listed, but only abnormal results are displayed) Labs Reviewed  CBC - Abnormal; Notable for the following components:      Result Value   RBC 3.52 (*)    Hemoglobin 10.4 (*)    HCT 31.7 (*)    All other components within normal limits  COMPREHENSIVE METABOLIC PANEL  OCCULT BLOOD X 1 CARD TO LAB, STOOL    EKG None  Radiology No results found.  Procedures Procedures    Medications Ordered in ED Medications - No data to display  ED Course/ Medical Decision Making/ A&P                                 Medical Decision Making Amount and/or Complexity of Data Reviewed Labs: ordered.   Patient with one  episode of bright red rectal bleeding. She is currently hemoccult negative, with anemia at baseline.  Low suspicion for inflammatory bowel disorder, rectal ulcer or rectal foreign body. Presentation not consistent with other acute, emergent causes of upper or lower GI bleeding. No evidence of hemorrhagic shock. Care instructions and return precautions provided. Patient appears safe for discharge at this time.        Final Clinical Impression(s) / ED Diagnoses Final diagnoses:  Bright red rectal bleeding    Rx / DC Orders ED Discharge Orders     None         Claudene Lenis, NP 08/15/23 0030    Yolande Lamar BROCKS, MD 08/16/23 1258

## 2023-08-14 NOTE — Telephone Encounter (Signed)
 Returned her call. She had a bm this am with a large amount of blood in the toilet. She is feeling weak now. Instructed to go to the ER to be seen now. She is going to [facility] ER now.  FYI

## 2023-08-14 NOTE — ED Triage Notes (Signed)
 Red in toilet after BM this am Feels weak and light headed nausea Hx of anemia

## 2023-08-14 NOTE — Discharge Instructions (Signed)
 Please refer to the attached instructions. Follow up with your oncologist and gastroenterologist as discussed.

## 2023-08-14 NOTE — ED Notes (Signed)
 Discharge paperwork given and verbally understood.

## 2023-08-15 ENCOUNTER — Telehealth: Payer: Self-pay | Admitting: *Deleted

## 2023-08-15 ENCOUNTER — Other Ambulatory Visit: Payer: Self-pay | Admitting: Hematology and Oncology

## 2023-08-15 ENCOUNTER — Telehealth: Payer: Self-pay | Admitting: Gastroenterology

## 2023-08-15 NOTE — Telephone Encounter (Signed)
 I have spoken to patient who says that she recently went to the ER for large volume brb. Had 1 episode of bright red blood on toilet tissue and in the toilet bowl. She is unable to confirm whether she saw clots of blood in the toilet or stool. She says that at times, she has a "stinging" sensation in her rectum after bowel movements. She does have some issues with constipation at times as well. Patient denies any black stool, abdominal pain, fever. No vomiting although occasional nausea.  Patient has chronic IDA for which she receives periodic iron  infusions. 2022 colonoscopy suggestions was for repeat in 3 years (11/2023).   I offered the patient an appointment within the week with one of our extenders. However, patient states that she prefers to see only Dr Karene Oto since he knows her history and she trusts him. I have scheduled her for appointment with Dr Karene Oto on 09/19/23. She is asked to watch for any recurrent large volume bleeding, severe abdominal pain, increased shortness of breath, chest pain, dizziness and seek emergent help in the mean time should she develop these symptoms. She should also make our office aware. She verbalizes understanding of this information.

## 2023-08-15 NOTE — Telephone Encounter (Addendum)
 TCT patient per MD message below. Ms. Tenbrink stated she was tired, but otherwise feeling ok today. No bleeding noted today. Patient states she called her GI this AM and left message. Encouraged her to call them back this afternoon if she does not hear from them. She knows scheduling will call her regarding iron  infusion appts.   ----- Message from Almeda Jacobs sent at 08/15/2023  8:34 AM EST ----- She went to the ER yesterday due to rectal bleeding She needs to call GI: Dr. Karene Oto for repeat colonoscopy I will get her scheduled for IV iron  next week, scheduler will call her

## 2023-08-15 NOTE — Telephone Encounter (Signed)
 Inbound call from patient stating that she is having blood in her stool. Patient was seen in the ED yesterday and was advised to make an appointment with Dr. Karene Oto. I advised her that he is currently booked up  and we could schedule her with a PA. Patient states she wants to see he due to he knows her history. Patient is requesting a call to discuss. Please advise.

## 2023-08-15 NOTE — Telephone Encounter (Signed)
 Routing to Dr. Andre Kawasaki nurse - Vale Garrison, RN

## 2023-08-23 ENCOUNTER — Other Ambulatory Visit: Payer: Self-pay

## 2023-08-23 ENCOUNTER — Inpatient Hospital Stay: Payer: Medicaid Other | Admitting: Hematology and Oncology

## 2023-08-23 ENCOUNTER — Inpatient Hospital Stay: Payer: Medicaid Other | Attending: Hematology and Oncology

## 2023-08-23 ENCOUNTER — Inpatient Hospital Stay: Payer: Medicaid Other

## 2023-08-23 VITALS — BP 143/73 | HR 95 | Temp 98.9°F | Resp 20

## 2023-08-23 DIAGNOSIS — D509 Iron deficiency anemia, unspecified: Secondary | ICD-10-CM | POA: Diagnosis present

## 2023-08-23 MED ORDER — SODIUM CHLORIDE 0.9 % IV SOLN
400.0000 mg | Freq: Once | INTRAVENOUS | Status: AC
  Administered 2023-08-23: 400 mg via INTRAVENOUS
  Filled 2023-08-23: qty 400

## 2023-08-23 MED ORDER — SODIUM CHLORIDE 0.9 % IV SOLN: Freq: Once | INTRAVENOUS | Status: AC

## 2023-08-23 NOTE — Patient Instructions (Signed)

## 2023-08-23 NOTE — Progress Notes (Signed)
Patient declined post iron infusion observation.  Tolerated treatment well without incident.  VSS at discharge.  Ambulated to lobby.

## 2023-08-30 ENCOUNTER — Ambulatory Visit: Payer: Medicaid Other | Admitting: Primary Care

## 2023-09-04 ENCOUNTER — Inpatient Hospital Stay: Payer: Medicaid Other

## 2023-09-04 ENCOUNTER — Inpatient Hospital Stay: Payer: Medicaid Other | Admitting: Hematology and Oncology

## 2023-09-07 ENCOUNTER — Telehealth: Payer: Self-pay | Admitting: Primary Care

## 2023-09-07 ENCOUNTER — Encounter: Payer: Medicaid Other | Admitting: Primary Care

## 2023-09-07 NOTE — Telephone Encounter (Signed)
 Attempted to connect with patient for virtual visit but unable, left message to reschedule

## 2023-09-07 NOTE — Progress Notes (Signed)
 This encounter was created in error - please disregard.

## 2023-09-10 NOTE — Telephone Encounter (Signed)
 Routing to front desk to reschedule pts appointment.

## 2023-09-11 NOTE — Telephone Encounter (Signed)
Patient was sent no show letter and given courtesy call to reschedule. Closing encounter.

## 2023-09-19 ENCOUNTER — Ambulatory Visit: Payer: Medicaid Other | Admitting: Gastroenterology

## 2023-10-17 ENCOUNTER — Ambulatory Visit: Payer: Medicaid Other | Admitting: Gastroenterology

## 2023-11-24 ENCOUNTER — Emergency Department (HOSPITAL_BASED_OUTPATIENT_CLINIC_OR_DEPARTMENT_OTHER): Admission: EM | Admit: 2023-11-24 | Discharge: 2023-11-24 | Disposition: A | Discharge status: Home / Self Care

## 2023-11-24 ENCOUNTER — Other Ambulatory Visit: Payer: Self-pay

## 2023-11-24 ENCOUNTER — Emergency Department (HOSPITAL_BASED_OUTPATIENT_CLINIC_OR_DEPARTMENT_OTHER)

## 2023-11-24 DIAGNOSIS — M545 Low back pain, unspecified: Secondary | ICD-10-CM | POA: Diagnosis present

## 2023-11-24 DIAGNOSIS — K5732 Diverticulitis of large intestine without perforation or abscess without bleeding: Secondary | ICD-10-CM | POA: Insufficient documentation

## 2023-11-24 DIAGNOSIS — M79604 Pain in right leg: Secondary | ICD-10-CM | POA: Insufficient documentation

## 2023-11-24 DIAGNOSIS — G8929 Other chronic pain: Secondary | ICD-10-CM | POA: Diagnosis not present

## 2023-11-24 DIAGNOSIS — R1032 Left lower quadrant pain: Secondary | ICD-10-CM | POA: Insufficient documentation

## 2023-11-24 DIAGNOSIS — D72829 Elevated white blood cell count, unspecified: Secondary | ICD-10-CM | POA: Insufficient documentation

## 2023-11-24 DIAGNOSIS — M79605 Pain in left leg: Secondary | ICD-10-CM | POA: Diagnosis not present

## 2023-11-24 LAB — LIPASE, BLOOD: Lipase: 26 U/L (ref 11–51)

## 2023-11-24 LAB — COMPREHENSIVE METABOLIC PANEL WITH GFR
ALT: 10 U/L (ref 0–44)
AST: 18 U/L (ref 15–41)
Albumin: 4.5 g/dL (ref 3.5–5.0)
Alkaline Phosphatase: 117 U/L (ref 38–126)
Anion gap: 12 (ref 5–15)
BUN: 11 mg/dL (ref 8–23)
CO2: 23 mmol/L (ref 22–32)
Calcium: 9.9 mg/dL (ref 8.9–10.3)
Chloride: 102 mmol/L (ref 98–111)
Creatinine, Ser: 1.15 mg/dL — ABNORMAL HIGH (ref 0.44–1.00)
GFR, Estimated: 53 mL/min — ABNORMAL LOW (ref >60)
Glucose, Bld: 115 mg/dL — ABNORMAL HIGH (ref 70–99)
Potassium: 4.3 mmol/L (ref 3.5–5.1)
Sodium: 137 mmol/L (ref 135–145)
Total Bilirubin: 0.2 mg/dL (ref 0.0–1.2)
Total Protein: 7.1 g/dL (ref 6.5–8.1)

## 2023-11-24 LAB — CBC
HCT: 28.3 % — ABNORMAL LOW (ref 36.0–46.0)
Hemoglobin: 9.2 g/dL — ABNORMAL LOW (ref 12.0–15.0)
MCH: 29.3 pg (ref 26.0–34.0)
MCHC: 32.5 g/dL (ref 30.0–36.0)
MCV: 90.1 fL (ref 80.0–100.0)
Platelets: 432 10*3/uL — ABNORMAL HIGH (ref 150–400)
RBC: 3.14 MIL/uL — ABNORMAL LOW (ref 3.87–5.11)
RDW: 16.6 % — ABNORMAL HIGH (ref 11.5–15.5)
WBC: 11.3 10*3/uL — ABNORMAL HIGH (ref 4.0–10.5)
nRBC: 0 % (ref 0.0–0.2)

## 2023-11-24 MED ORDER — KETOROLAC TROMETHAMINE 15 MG/ML IJ SOLN
15.0000 mg | Freq: Once | INTRAMUSCULAR | Status: AC
  Administered 2023-11-24: 15 mg via INTRAVENOUS
  Filled 2023-11-24: qty 1

## 2023-11-24 MED ORDER — LIDOCAINE 5 % EX PTCH
1.0000 | MEDICATED_PATCH | Freq: Once | CUTANEOUS | Status: DC
  Administered 2023-11-24: 1 via TRANSDERMAL
  Filled 2023-11-24: qty 1

## 2023-11-24 MED ORDER — ACETAMINOPHEN 325 MG PO TABS
650.0000 mg | ORAL_TABLET | Freq: Once | ORAL | Status: AC
  Administered 2023-11-24: 650 mg via ORAL
  Filled 2023-11-24: qty 2

## 2023-11-24 MED ORDER — IOHEXOL 300 MG/ML  SOLN
100.0000 mL | Freq: Once | INTRAMUSCULAR | Status: AC | PRN
  Administered 2023-11-24: 80 mL via INTRAVENOUS

## 2023-11-24 MED ORDER — METHOCARBAMOL 500 MG PO TABS
500.0000 mg | ORAL_TABLET | Freq: Once | ORAL | Status: AC
  Administered 2023-11-24: 500 mg via ORAL
  Filled 2023-11-24: qty 1

## 2023-11-24 MED ORDER — LIDOCAINE 5 % EX PTCH: 1.0000 | MEDICATED_PATCH | CUTANEOUS | 0 refills | Status: DC

## 2023-11-24 MED ORDER — AMOXICILLIN-POT CLAVULANATE 875-125 MG PO TABS: 1.0000 | ORAL_TABLET | Freq: Two times a day (BID) | ORAL | 0 refills | Status: DC

## 2023-11-24 NOTE — Discharge Instructions (Signed)
 Please follow-up with your primary doctor.  Return immediately for fevers, chills, chest pain, shortness of breath, worsening back pain, bowel or bladder incontinence or inability to void, severe pain, weakness, your legs become blue, cold or pale or you develop any new or worsening symptoms that are concerning to you.

## 2023-11-24 NOTE — ED Triage Notes (Signed)
 Dull lower back pain traveling down her leg several years. Normally takes aleve but has not helped pain since last night. Also having lower abdominal pain/ cramping for 3 days ago.

## 2023-11-24 NOTE — ED Provider Notes (Signed)
 [place] EMERGENCY DEPARTMENT AT [facility] Provider Note   CSN: [id number] Arrival date & time: 11/24/23  1749     History  No chief complaint on file.   Valerie Russell is a 63 y.o. female.  This is a 63 year old female presenting emergency department for bilateral leg pain as well as lower abdominal pain.  Symptoms been ongoing for the past 3 days.  Reports history of chronic pain to her low back and has had similar radiation down bilateral legs in the past, but not for the duration that she has had for the past several days.  Times of not improved with over-the-counter medications.  Reports normal bowel movements.  No nausea no vomiting.  No fevers.  No bowel or bladder incontinence.  No saddle anesthesia.        Home Medications Prior to Admission medications   Medication Sig Start Date End Date Taking? Authorizing Provider  amoxicillin -clavulanate (AUGMENTIN ) 875-125 MG tablet Take 1 tablet by mouth every 12 (twelve) hours. 11/24/23  Yes Teshaun Olarte, Lajean Pike, DO  lidocaine  (LIDODERM ) 5 % Place 1 patch onto the skin daily. Remove & Discard patch within 12 hours or as directed by MD 11/24/23  Yes Rolinda Climes, DO  albuterol  (VENTOLIN  HFA) 108 (90 Base) MCG/ACT inhaler Inhale 2 puffs into the lungs every 6 (six) hours as needed for wheezing or shortness of breath. F/u for further refills Fill insurance preference. 07/05/23   Antonio Baumgarten, NP  cetirizine (ZYRTEC) 10 MG tablet Take 10 mg by mouth daily.    [provider]  esomeprazole (NEXIUM) 20 MG capsule Take 20 mg by mouth daily at 12 noon.    [provider]  folic acid (FOLVITE) 1 MG tablet Take 1 mg by mouth daily.    [provider]  lisinopril  (ZESTRIL ) 20 MG tablet Take 1 tablet by mouth once daily 07/07/22   Tonna Frederic, MD  Magnesium 500 MG TABS Take 500 mg by mouth daily.    [provider]  Multiple Vitamin (MULTIVITAMIN PO) Take 1 tablet by mouth daily.     [provider]  umeclidinium bromide  (INCRUSE ELLIPTA ) 62.5 MCG/ACT AEPB Inhale 1 puff into the lungs daily. 05/18/22   Antonio Baumgarten, NP  VENTOLIN  HFA 108 (90 Base) MCG/ACT inhaler INHALE 1 TO 2 PUFFS INTO LUNGS EVERY 6 HOURS AS NEEDED FOR WHEEZING OR SHORTNESS OF BREATH 11/27/22   Antonio Baumgarten, NP  vitamin B-12 (CYANOCOBALAMIN) 1000 MCG tablet Take 1,000 mcg by mouth daily.    [provider]      Allergies    Patient has no known allergies.    Review of Systems   Review of Systems  Physical Exam Updated Vital Signs BP (!) 101/59   Pulse 86   Temp 98.3 F (36.8 C) (Oral)   Resp 20   SpO2 99%  Physical Exam Vitals and nursing note reviewed.  Constitutional:      General: She is not in acute distress.    Appearance: She is not toxic-appearing.  HENT:     Head: Normocephalic.     Nose: Nose normal.     Mouth/Throat:     Mouth: Mucous membranes are moist.  Eyes:     Conjunctiva/sclera: Conjunctivae normal.  Cardiovascular:     Rate and Rhythm: Normal rate and regular rhythm.  Pulmonary:     Effort: Pulmonary effort is normal.     Breath sounds: Normal breath sounds.  Abdominal:  General: Abdomen is flat. There is no distension.     Palpations: Abdomen is soft.     Tenderness: There is abdominal tenderness (mild llq). There is no guarding or rebound.  Musculoskeletal:        General: Normal range of motion.     Comments: No midline spinal tenderness.  5 out of 5 plantarflexion dorsiflexion.  Normal sensation.  2+ DP pulses bilaterally.  Full range of motion to the hip.  Skin:    General: Skin is warm.     Capillary Refill: Capillary refill takes less than 2 seconds.  Neurological:     General: No focal deficit present.     Mental Status: She is alert and oriented to person, place, and time.  Psychiatric:        Mood and Affect: Mood normal.        Behavior: Behavior normal.     ED Results / Procedures / Treatments   Labs (all labs  ordered are listed, but only abnormal results are displayed) Labs Reviewed  COMPREHENSIVE METABOLIC PANEL WITH GFR - Abnormal; Notable for the following components:      Result Value   Glucose, Bld 115 (*)    Creatinine, Ser 1.15 (*)    GFR, Estimated 53 (*)    All other components within normal limits  CBC - Abnormal; Notable for the following components:   WBC 11.3 (*)    RBC 3.14 (*)    Hemoglobin 9.2 (*)    HCT 28.3 (*)    RDW 16.6 (*)    Platelets 432 (*)    All other components within normal limits  LIPASE, BLOOD    EKG None  Radiology CT L-SPINE NO CHARGE Result Date: 11/24/2023 CLINICAL DATA:  Low back pain with lower extremity radiculopathy EXAM: CT LUMBAR SPINE WITHOUT CONTRAST TECHNIQUE: Multidetector CT imaging of the lumbar spine was performed without intravenous contrast administration. Multiplanar CT image reconstructions were also generated. RADIATION DOSE REDUCTION: This exam was performed according to the departmental dose-optimization program which includes automated exposure control, adjustment of the mA and/or kV according to patient size and/or use of iterative reconstruction technique. COMPARISON:  03/10/2021 FINDINGS: Segmentation: 5 lumbar type vertebrae. The last complete disc space will be designated as L5/S1, with sacralization of the L5 vertebral body on the right again noted. Alignment: Alignment is anatomic. Vertebrae: Chronic T11 compression deformity. No acute fractures. No destructive bony abnormalities. Paraspinal and other soft tissues: The paraspinal soft tissues are unremarkable. Please refer to CT abdomen and pelvis report describing acute uncomplicated diverticulitis of the descending colon as well as significant aortic atherosclerosis. Disc levels: At L1-2 there is moderate disc space narrowing and broad-based disc bulge resulting in mild central canal stenosis and left greater than right neural foraminal encroachment. At L2-3 there is circumferential  disc bulge with bilateral facet and ligamentum flavum hypertrophy resulting in mild-to-moderate central canal stenosis and symmetrical neural foraminal encroachment. Mild spondylosis and facet hypertrophy at L3-4, L4-5, and L5-S1 without significant compressive sequela. Reconstructed images demonstrate no additional findings. IMPRESSION: 1. Chronic T11 compression deformity. No acute lumbar spine fracture. 2. Multilevel lumbar spondylosis greatest at L1-2 and L2-3, without significant change since previous MRI. 3. Please refer to separately reported CT abdomen and pelvis exam for findings in that region. Electronically Signed   By: Bobbye Burrow M.D.   On: 11/24/2023 21:20   CT ABDOMEN PELVIS W CONTRAST Result Date: 11/24/2023 CLINICAL DATA:  Lower back pain, lower abdominal pain and cramping  for 3 days EXAM: CT ABDOMEN AND PELVIS WITH CONTRAST TECHNIQUE: Multidetector CT imaging of the abdomen and pelvis was performed using the standard protocol following bolus administration of intravenous contrast. RADIATION DOSE REDUCTION: This exam was performed according to the departmental dose-optimization program which includes automated exposure control, adjustment of the mA and/or kV according to patient size and/or use of iterative reconstruction technique. CONTRAST:  80mL OMNIPAQUE  IOHEXOL  300 MG/ML  SOLN COMPARISON:  09/18/2013 FINDINGS: Lower chest: No acute pleural or parenchymal lung disease. Hepatobiliary: No focal liver abnormality is seen. No gallstones, gallbladder wall thickening, or biliary dilatation. Pancreas: Unremarkable. No pancreatic ductal dilatation or surrounding inflammatory changes. Spleen: Normal in size without focal abnormality. Adrenals/Urinary Tract: Kidneys enhance normally. No urinary tract calculi or obstruction. The bladder is only minimally distended, limiting its evaluation. The adrenals are unremarkable. Stomach/Bowel: No bowel obstruction or ileus. Colonic diverticulosis, with mild  wall thickening and pericolonic fat stranding involving the descending colon consistent with acute uncomplicated diverticulitis. No perforation, fluid collection, or abscess. Vascular/Lymphatic: Atherosclerosis of the aorta, with high-grade stenosis at the origin of the bilateral common iliac arteries right greater than left. Please correlate with symptoms of lower extremity claudication. No pathologic adenopathy. Reproductive: Uterus and bilateral adnexa are unremarkable. Other: Trace fluid within the left paracolic gutter. No free intraperitoneal gas. No abdominal wall hernia. Musculoskeletal: No acute or destructive bony abnormalities. Chronic T11 compression deformity. Reconstructed images demonstrate no additional findings. IMPRESSION: 1. Acute uncomplicated diverticulitis of the descending colon. No perforation, fluid collection, or abscess. 2. Aortic atherosclerosis, with high-grade stenosis at the origin of the bilateral common iliac arteries right greater than left. Please correlate with symptoms of lower extremity claudication. Electronically Signed   By: Bobbye Burrow M.D.   On: 11/24/2023 21:15    Procedures Procedures    Medications Ordered in ED Medications  lidocaine  (LIDODERM ) 5 % 1 patch (1 patch Transdermal Patch Applied 11/24/23 1905)  ketorolac  (TORADOL ) 15 MG/ML injection 15 mg (15 mg Intravenous Given 11/24/23 1903)  acetaminophen  (TYLENOL ) tablet 650 mg (650 mg Oral Given 11/24/23 1906)  methocarbamol (ROBAXIN) tablet 500 mg (500 mg Oral Given 11/24/23 1910)  iohexol  (OMNIPAQUE ) 300 MG/ML solution 100 mL (80 mLs Intravenous Contrast Given 11/24/23 1930)    ED Course/ Medical Decision Making/ A&P Clinical Course as of 11/24/23 2243  Sat Nov 24, 2023  2130 CT ABDOMEN PELVIS W CONTRAST IMPRESSION: 1. Acute uncomplicated diverticulitis of the descending colon. No perforation, fluid collection, or abscess. 2. Aortic atherosclerosis, with high-grade stenosis at the origin of the  bilateral common iliac arteries right greater than left. Please correlate with symptoms of lower extremity claudication.   [TY]  2130 CT L-SPINE NO CHARGE IMPRESSION: 1. Chronic T11 compression deformity. No acute lumbar spine fracture. 2. Multilevel lumbar spondylosis greatest at L1-2 and L2-3, without significant change since previous MRI. 3. Please refer to separately reported CT abdomen and pelvis exam for findings in that region.   Electronically Signed   [TY]    Clinical Course User Index [TY] Rolinda Climes, DO                                 Medical Decision Making This is 63 year old female presenting emergency department for low back pain.  Is afebrile, slightly elevated heart rate.  Normotensive.  Reassuring exam with no red flags for acute cord compression/cauda equina.  Equal pulses with warm well-perfused extremities.  Low suspicion  for limb ischemia.  She reports history of chronic back pain with similar type symptoms.  Given her tachycardia screening labs ordered.  Minor leukocytosis.  No overt source of infection.  Is having some lower abdominal pain.  Per review does appear to have a history of diverticulitis.  Will get CT scan to evaluate.  Baseline anemia.  No significant metabolic derangements.  Normal kidney function.  No transaminitis to suggest hepatobiliary disease.  Lipase normal.  Pancreatitis unlikely.  Patient treated with multimodal pain medications Toradol , Tylenol , Robaxin and lidocaine  patch.    Did MDM.  Patient feeling improved after medications.  CT scans with diverticulitis.  No acute spinal pathology.  There was some significant aortic atherosclerosis/stenosis at the femoral bases.  Her symptoms are seemingly exacerbated by walking per her report, which could be claudication, but also could be secondary to her chronic back pain.  She is feeling improved after pain medications.  I have low suspicion for acute limb ischemia as her extremities are warm  and well-perfused and she has palpable pulses in her bilateral lower extremities.  Will give referral to vascular surgery.  Care plan discussed with patient, all questions answered.  Stable for discharge at this time.  Amount and/or Complexity of Data Reviewed Labs: ordered. Radiology: ordered. Decision-making details documented in ED Course.  Risk OTC drugs. Prescription drug management.         Final Clinical Impression(s) / ED Diagnoses Final diagnoses:  Chronic bilateral low back pain, unspecified whether sciatica present    Rx / DC Orders ED Discharge Orders          Ordered    Ambulatory referral to Vascular Surgery        11/24/23 2157    lidocaine  (LIDODERM ) 5 %  Every 24 hours        11/24/23 2158    amoxicillin -clavulanate (AUGMENTIN ) 875-125 MG tablet  Every 12 hours        11/24/23 2241              Rolinda Climes, DO 11/24/23 2243

## 2023-11-28 ENCOUNTER — Telehealth: Payer: Self-pay

## 2023-11-28 NOTE — Telephone Encounter (Signed)
 Pt was offered appt for 5/1 and 5/2 but is currently out of town for work. She accepted appt for 5/6 at 1140.

## 2023-11-28 NOTE — Telephone Encounter (Signed)
 Can she come in tomorrow or Friday? I have a few slots open. No need labs, please schedule

## 2023-11-28 NOTE — Telephone Encounter (Signed)
 Patient called and has several questions regarding her anemia. She was recently seen for leg pain and on her labs, her hgb is noted to be 9.2 on 4/26. She is concerned about anemia starting when she donated blood. She doesn't currently have an MD f/u. Advised pt I would make MD aware and we would be in touch.

## 2023-12-04 ENCOUNTER — Inpatient Hospital Stay: Attending: Hematology and Oncology | Admitting: Hematology and Oncology

## 2023-12-04 ENCOUNTER — Encounter: Payer: Self-pay | Admitting: Hematology and Oncology

## 2023-12-04 ENCOUNTER — Telehealth: Payer: Self-pay

## 2023-12-04 VITALS — BP 167/81 | HR 98 | Temp 97.8°F | Resp 18 | Ht 63.0 in | Wt 129.6 lb

## 2023-12-04 DIAGNOSIS — F17200 Nicotine dependence, unspecified, uncomplicated: Secondary | ICD-10-CM | POA: Insufficient documentation

## 2023-12-04 DIAGNOSIS — M79605 Pain in left leg: Secondary | ICD-10-CM

## 2023-12-04 DIAGNOSIS — Z720 Tobacco use: Secondary | ICD-10-CM | POA: Diagnosis not present

## 2023-12-04 DIAGNOSIS — M79604 Pain in right leg: Secondary | ICD-10-CM

## 2023-12-04 DIAGNOSIS — D509 Iron deficiency anemia, unspecified: Secondary | ICD-10-CM

## 2023-12-04 NOTE — Progress Notes (Signed)
 [facility] OFFICE PROGRESS NOTE  Danella Dunn, MD  ASSESSMENT & PLAN:  Assessment & Plan Iron  deficiency anemia, unspecified iron  deficiency anemia type She has history of known AVM She is somewhat symptomatic from her anemia I recommend 2 doses of intravenous iron  and plan to see her again in 2 months for further follow-up  The most likely cause of her anemia is due to chronic blood loss/malabsorption syndrome. We discussed some of the risks, benefits, and alternatives of intravenous iron  infusions. The patient is symptomatic from anemia and the iron  level is critically low. She tolerated oral iron  supplement poorly and desires to achieved higher levels of iron  faster for adequate hematopoesis. Some of the side-effects to be expected including risks of infusion reactions, phlebitis, headaches, nausea and fatigue.  The patient is willing to proceed. Patient education material was dispensed.  Goal is to keep ferritin level greater than 50 and resolution of anemia  Tobacco use The patient is motivated to quit smoking I encouraged the patient to quit Pain in both lower extremities She has chronic lower extremity pain for many years, with component of sciatica and probably insufficiency/poor circulation from tobacco abuse She has appointment to follow-up with vascular    No orders of the defined types were placed in this encounter.   INTERVAL HISTORY: Patient returns for recurrent anemia Symptoms of anemia includes fatigue and numbness/paresthesias We reviewed recent blood work from April 2025 The patient denies any recent signs or symptoms of bleeding such as spontaneous epistaxis, hematuria or hematochezia. She has appointment to follow-up with gastroenterologist next month for further evaluation  SUMMARY OF HEMATOLOGIC HISTORY:   She was found to have abnormal CBC from blood work I have reviewed her CBC dated back to 2021 She used to have history of normal  hemoglobin but starting in 2021, she become anemic may have microcytosis She had numerous endoscopy evaluation as listed: - Colonoscopy (02/2020): 3 subcentimeter adenomas in the descending/ascending colon, 8 mm TA in sigmoid, 6 smaller sigmoid hyperplastic polyps.  25 mm polyp in the proximal rectum not resected in favor of EMR at the hospital.  Single small cecal AVM.  Internal hemorrhoids, sigmoid diverticulosis.  Normal TI - Colonoscopy (04/2020): 30 mm tubular adenoma with focal high grade dysplasia via piecemeal technique EMR.  Sigmoid diverticulosis - EGD (04/2020): 3 cm HH, otherwise normal - Colonoscopy (11/2020): Small cecal AVM, 4 sigmoid hyperplastic polyps ranging 1-3 mm (path:), 3 benign rectal hyperplastic polyps.  Post polypectomy scar in rectum (biopsied: Normal, benign tissue).  Sigmoid diverticulosis, medium sized grade 2 internal hemorrhoids.   She hasrecent chest pain on exertion, shortness of breath on minimal exertion, pre-syncopal episodes, palpitations, leg cramps and fatigue. She had not noticed any recent bleeding such as epistaxis, hematuria or hematochezia The patient takes Aleve regularly for chronic back pain. She is not on antiplatelets agents. Her last colonoscopy was last year She had no prior history or diagnosis of cancer but but have history of abnormal Pap smear, treated with laser therapy. Her age appropriate screening programs are up-to-date. She denies any pica and eats a variety of diet. She donated blood once but has never received blood transfusion The patient was prescribed oral iron  supplements and she takes 1 daily for 2 years without success of improving her blood count She stated that the oral iron  supplement cause constipation and bloating From 20 23-20 25, she has received multiple doses of intravenous iron  infusion  Vitals:   12/04/23 1200  BP: (!) 167/81  Pulse: 98  Resp: 18  Temp: 97.8 F (36.6 C)  SpO2: 100%

## 2023-12-04 NOTE — Assessment & Plan Note (Addendum)
The patient is motivated to quit smoking ?I encouraged the patient to quit ?

## 2023-12-04 NOTE — Assessment & Plan Note (Addendum)
 She has history of known AVM She is somewhat symptomatic from her anemia I recommend 2 doses of intravenous iron  and plan to see her again in 2 months for further follow-up  The most likely cause of her anemia is due to chronic blood loss/malabsorption syndrome. We discussed some of the risks, benefits, and alternatives of intravenous iron  infusions. The patient is symptomatic from anemia and the iron  level is critically low. She tolerated oral iron  supplement poorly and desires to achieved higher levels of iron  faster for adequate hematopoesis. Some of the side-effects to be expected including risks of infusion reactions, phlebitis, headaches, nausea and fatigue.  The patient is willing to proceed. Patient education material was dispensed.  Goal is to keep ferritin level greater than 50 and resolution of anemia

## 2023-12-04 NOTE — Telephone Encounter (Signed)
 Dr. Marton Sleeper, patient will be scheduled as soon as possible.  Auth Submission: NO AUTH NEEDED Site of care: Site of care: [facility] Payer: [employer] medicaid Medication & CPT/J Code(s) submitted: Feraheme (ferumoxytol) [id number] Route of submission (phone, fax, portal):  Phone # Fax # Auth type: Buy/Bill PB Units/visits requested: 510mg  x 2 doses Reference number:  Approval from: 12/04/23 to 04/05/24

## 2023-12-04 NOTE — Assessment & Plan Note (Addendum)
 She has chronic lower extremity pain for many years, with component of sciatica and probably insufficiency/poor circulation from tobacco abuse She has appointment to follow-up with vascular

## 2023-12-07 ENCOUNTER — Ambulatory Visit (INDEPENDENT_AMBULATORY_CARE_PROVIDER_SITE_OTHER)

## 2023-12-07 VITALS — BP 143/59 | HR 92 | Temp 97.8°F | Resp 16 | Ht 63.0 in | Wt 128.8 lb

## 2023-12-07 DIAGNOSIS — D509 Iron deficiency anemia, unspecified: Secondary | ICD-10-CM | POA: Diagnosis not present

## 2023-12-07 MED ORDER — SODIUM CHLORIDE 0.9 % IV SOLN
510.0000 mg | Freq: Once | INTRAVENOUS | Status: AC
  Administered 2023-12-07: 510 mg via INTRAVENOUS
  Filled 2023-12-07: qty 17

## 2023-12-07 NOTE — Patient Instructions (Signed)

## 2023-12-07 NOTE — Progress Notes (Signed)
 Diagnosis: Iron  Deficiency Anemia  Provider:  Praveen Mannam MD  Procedure: IV Infusion  IV Type: Peripheral, IV Location: R Forearm  Feraheme (Ferumoxytol), Dose: 510 mg  Infusion Start Time: 0901  Infusion Stop Time: 0919  Post Infusion IV Care: Observation period completed and Peripheral IV Discontinued  Discharge: Condition: Good, Destination: Home . AVS Provided  Performed by:  Maurisa Tesmer, RN

## 2023-12-14 ENCOUNTER — Other Ambulatory Visit (INDEPENDENT_AMBULATORY_CARE_PROVIDER_SITE_OTHER): Payer: Self-pay

## 2023-12-14 ENCOUNTER — Ambulatory Visit

## 2023-12-14 ENCOUNTER — Other Ambulatory Visit: Payer: Self-pay

## 2023-12-14 ENCOUNTER — Ambulatory Visit: Admitting: Sports Medicine

## 2023-12-14 ENCOUNTER — Encounter: Payer: Self-pay | Admitting: Sports Medicine

## 2023-12-14 VITALS — BP 150/81 | HR 98 | Temp 98.1°F | Resp 18 | Ht 63.0 in | Wt 127.4 lb

## 2023-12-14 DIAGNOSIS — M25551 Pain in right hip: Secondary | ICD-10-CM

## 2023-12-14 DIAGNOSIS — D509 Iron deficiency anemia, unspecified: Secondary | ICD-10-CM

## 2023-12-14 DIAGNOSIS — M7061 Trochanteric bursitis, right hip: Secondary | ICD-10-CM

## 2023-12-14 DIAGNOSIS — R29898 Other symptoms and signs involving the musculoskeletal system: Secondary | ICD-10-CM | POA: Diagnosis not present

## 2023-12-14 MED ORDER — FERUMOXYTOL INJECTION 510 MG/17 ML
510.0000 mg | Freq: Once | INTRAVENOUS | Status: AC
  Administered 2023-12-14: 510 mg via INTRAVENOUS
  Filled 2023-12-14: qty 17

## 2023-12-14 MED ORDER — BUPIVACAINE HCL 0.25 % IJ SOLN
2.0000 mL | INTRAMUSCULAR | Status: AC | PRN
  Administered 2023-12-14: 2 mL via INTRA_ARTICULAR

## 2023-12-14 MED ORDER — LIDOCAINE HCL 1 % IJ SOLN
2.0000 mL | INTRAMUSCULAR | Status: AC | PRN
  Administered 2023-12-14: 2 mL

## 2023-12-14 MED ORDER — BETAMETHASONE SOD PHOS & ACET 6 (3-3) MG/ML IJ SUSP
6.0000 mg | INTRAMUSCULAR | Status: AC | PRN
  Administered 2023-12-14: 6 mg via INTRA_ARTICULAR

## 2023-12-14 NOTE — Progress Notes (Signed)
 Diagnosis: Iron  Deficiency Anemia  Provider:  Praveen Mannam MD  Procedure: IV Infusion  IV Type: Peripheral, IV Location: R Forearm  Feraheme (Ferumoxytol), Dose: 510 mg  Infusion Start Time: 1022  Infusion Stop Time: 1040  Post Infusion IV Care: Patient declined observation and Peripheral IV Discontinued  Discharge: Condition: Good, Destination: Home . AVS Declined  Performed by:  Acheron Sugg, RN

## 2023-12-14 NOTE — Progress Notes (Signed)
 Valerie Russell - 63 y.o. female MRN [id number]  Date of birth: 11/27/1960  Office Visit Note: Visit Date: 12/14/2023 PCP: Danella Dunn, MD Referred by: Danella Dunn, MD  Subjective: Chief Complaint  Patient presents with   Right Hip - Pain   HPI: Valerie Russell is a pleasant 63 y.o. female who presents today for acute on chronic right lateral hip pain.  Back in 2022 she had a similar pain over the lateral hip that received excellent relief from a greater trochanteric injection.  Her pain has been worse over the last 2 weeks.  She is using Biofreeze and infrequent Aleve as needed without significant pain.  She has no pain in the groin, no radicular symptoms.  Pertinent ROS were reviewed with the patient and found to be negative unless otherwise specified above in HPI.   Assessment & Plan: Visit Diagnoses:  1. Trochanteric bursitis, right hip   2. Pain in right hip   3. Weakness of right hip    Plan: Impression is chronic lateral hip pain with evidence of trochanteric bursitis as well as weakness with her hip abductor musculature.  Through shared decision-making, did proceed with ultrasound-guided greater trochanteric injection, patient tolerated well and had good relief from the anesthetic portion.  I discussed the importance of strengthening her hip abductors and gluteal tendons to prevent reoccurrence and improve strength.  She prefers home therapy versus formal PT.  We did print out a customized handout for hip strengthening and stabilization exercises which my athletic trainer, Lonzell Robin, did review in the room with her today.  Starting on Monday she may perform these once daily.  I would like to see her back in about 1 month to reevaluate. Ok for Tylenol , ice/heat, as needed.  Follow-up: Return in about 1 month (around 01/14/2024) for R-lateral hip.   Meds & Orders: No orders of the defined types were placed in this encounter.   Orders Placed This Encounter  Procedures   Large Joint  Inj   XR HIP UNILAT W OR W/O PELVIS 2-3 VIEWS RIGHT   US  Guided Needle Placement - No Linked Charges     Procedures: Large Joint Inj: R greater trochanter on 12/14/2023 2:52 PM Indications: pain Details: 22 G 3.5 in needle, ultrasound-guided lateral approach Medications: 2 mL lidocaine  1 %; 2 mL bupivacaine  0.25 %; 6 mg betamethasone  acetate-betamethasone  sodium phosphate 6 (3-3) MG/ML Outcome: tolerated well, no immediate complications  US -Guided Greater Trochanteric Bursa Injection, Right After discussion on risks/benefits/indications and informed verbal consent was obtained, a timeout was performed. The patient was lying in lateral recumbent position on exam table. Using ultrasound guidance, the greater trochanter was identified. The area overlying the trochanteric bursa was then prepped with Betadine and alcohol swabs. Following sterile precautions, ultrasound was reapplied to visualize needle guidance with a 22-gauge 3.5" needle utilizing an in-plane approach to inject the bursa with 2:2:1 lidocaine :bupivicaine:betamethasone . Delivery of the injectate was visualized into the region of hypoechoic fluid of the greater trochanteric bursa. Patient tolerated procedure well without immediate complications.    Procedure, treatment alternatives, risks and benefits explained, specific risks discussed. Consent was given by the patient. Immediately prior to procedure a time out was called to verify the correct patient, procedure, equipment, support staff and site/side marked as required. Patient was prepped and draped in the usual sterile fashion.          Clinical History: No specialty comments available.  She reports that she has been smoking cigarettes. She has a 6.3 pack-year  smoking history. She has never used smokeless tobacco. No results for input(s): "HGBA1C", "LABURIC" in the last 8760 hours.  Objective:    Physical Exam  Gen: Well-appearing, in no acute distress; non-toxic CV:  Well-perfused. Warm.  Resp: Breathing unlabored on room air; no wheezing. Psych: Fluid speech in conversation; appropriate affect; normal thought process  Ortho Exam - Right hip: + TTP overlying the greater trochanteric region.  There is a mild degree of soft tissue swelling here but no significant redness or warmth.  There is weakness with hip abduction, 4/5 strength compared to 5/5 strength of the contralateral side.  There is no limitation with internal or external logroll.  Negative FADIR, positive FABER testing.  Imaging: XR HIP UNILAT W OR W/O PELVIS 2-3 VIEWS RIGHT Result Date: 12/14/2023 2 view x-ray of the right hip including AP and lateral film were ordered and reviewed by myself today.  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  No acute fracture or bony abnormality noted.   Past Medical/Family/Surgical/Social History: Medications & Allergies reviewed per EMR, new medications updated. Patient Active Problem List   Diagnosis Date Noted   Osteoporosis 11/01/2021   Protrusion of lumbar intervertebral disc 03/16/2021   Aortic atherosclerosis (HCC) 02/01/2021   Snores 02/01/2021   Multiple polyps of sigmoid colon    Grade II internal hemorrhoids    History of rectal polyps    Iron  deficiency anemia    Diverticulosis of colon without hemorrhage    Rectal polyp    Iron  deficiency 03/08/2020   Hematochezia 03/08/2020   COPD (chronic obstructive pulmonary disease) (HCC) 03/08/2020   Microcytic anemia 01/28/2020   Tobacco use 01/28/2020   Elevated LDL cholesterol level 01/28/2020   Pain in both lower extremities 01/28/2020   Dysthymia 01/28/2020   Essential hypertension 12/23/2019   Healthcare maintenance 12/23/2019   Past Medical History:  Diagnosis Date   Allergy    seasonal allergies   Anemia    on meds   Cervical cancer (HCC)    in her 8s pt states, laser surgery to remove   COPD (chronic  obstructive pulmonary disease) (HCC)    uses inhaler   Diverticulitis    GERD (gastroesophageal reflux disease)    on meds   Hypertension    on meds   Lung cancer (HCC) 2022   per pt (12/09/2020)   Seasonal allergies    Family History  Problem Relation Age of Onset   Cancer Other    Hypertension Mother    COPD Mother    Stroke Mother    Colon cancer Neg Hx    Colon polyps Neg Hx    Esophageal cancer Neg Hx    Rectal cancer Neg Hx    Stomach cancer Neg Hx    Past Surgical History:  Procedure Laterality Date   APPENDECTOMY     BIOPSY  05/10/2020   Procedure: BIOPSY;  Surgeon: Annis Kinder, DO;  Location: [facility];  Service: Gastroenterology;;   BIOPSY  12/23/2020   Procedure: BIOPSY;  Surgeon: Annis Kinder, DO;  Location: [facility];  Service: Gastroenterology;;   CESAREAN SECTION  05/04/1997   COLONOSCOPY WITH PROPOFOL  N/A 05/10/2020   Procedure: COLONOSCOPY WITH PROPOFOL ;  Surgeon: Annis Kinder, DO;  Location: [facility];  Service: Gastroenterology;  Laterality: N/A;  colonoscopy with EMR   COLONOSCOPY WITH PROPOFOL  N/A 12/23/2020   Procedure: COLONOSCOPY WITH PROPOFOL ;  Surgeon: Annis Kinder, DO;  Location:  [facility];  Service: Gastroenterology;  Laterality: N/A;   ENDOSCOPIC MUCOSAL RESECTION N/A 05/10/2020   Procedure: ENDOSCOPIC MUCOSAL RESECTION;  Surgeon: Annis Kinder, DO;  Location: [facility];  Service: Gastroenterology;  Laterality: N/A;   ESOPHAGOGASTRODUODENOSCOPY (EGD) WITH PROPOFOL  N/A 05/10/2020   Procedure: ESOPHAGOGASTRODUODENOSCOPY (EGD) WITH PROPOFOL ;  Surgeon: Annis Kinder, DO;  Location: [facility];  Service: Gastroenterology;  Laterality: N/A;   HEMOSTASIS CLIP PLACEMENT  05/10/2020   Procedure: HEMOSTASIS CLIP PLACEMENT;  Surgeon: Annis Kinder, DO;  Location: [facility];  Service: Gastroenterology;;   LASER ABLATION CONDYLOMA CERVICAL / VULVAR     POLYPECTOMY  12/23/2020   Procedure: POLYPECTOMY;   Surgeon: Annis Kinder, DO;  Location: [facility];  Service: Gastroenterology;;   SUBMUCOSAL LIFTING INJECTION  05/10/2020   Procedure: SUBMUCOSAL LIFTING INJECTION;  Surgeon: Annis Kinder, DO;  Location: [facility];  Service: Gastroenterology;;   WISDOM TOOTH EXTRACTION     Social History   Occupational History   Not on file  Tobacco Use   Smoking status: Every Day    Current packs/day: 0.25    Average packs/day: 0.3 packs/day for 25.0 years (6.3 ttl pk-yrs)    Types: Cigarettes   Smokeless tobacco: Never  Vaping Use   Vaping status: Never Used  Substance and Sexual Activity   Alcohol use: Yes    Alcohol/week: 4.0 standard drinks of alcohol    Types: 4 Standard drinks or equivalent per week    Comment: occasionally   Drug use: Not Currently    Types: Marijuana    Comment: sometimes   Sexual activity: Yes    Birth control/protection: None

## 2023-12-14 NOTE — Progress Notes (Signed)
 Patient says that she has had right hip pain for about 2 weeks now. She has had trouble with this hip in the past, but had an injection which gave her great relief. Her pain is over the lateral aspect of the hip. She does mention having some aching in the legs above the knees when she walks for awhile. She has gotten some relief with BioFreeze and Aleve as needed. She says that at rest her hip feels okay, but after being on her feet for awhile will have pain. She has been given home exercises in the past, but says that she is active on her own so has not been doing those exercises specifically.   Patient was instructed in 10 minutes of therapeutic exercises for right hip to improve strength, ROM and function according to my instructions and plan of care by a Certified Athletic Trainer during the office visit. A customized handout was provided and demonstration of proper technique shown and discussed. Patient did perform exercises and demonstrate understanding through teachback.  All questions discussed and answered.

## 2023-12-19 ENCOUNTER — Other Ambulatory Visit: Payer: Self-pay | Admitting: *Deleted

## 2023-12-19 DIAGNOSIS — M79604 Pain in right leg: Secondary | ICD-10-CM

## 2023-12-31 NOTE — Progress Notes (Signed)
 [facility] [place]  ASSESSMENT / PLAN: 63 y.o. 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  She has claudication symptoms which I think are neurogenic in origin.  Recommend:  Abstinence from all tobacco products. Blood glucose control with goal A1c < 7%. Blood pressure control with goal blood pressure < 130/80 mmHg. Lipid reduction therapy with goal LDL-C < 55 mg/dL. Aspirin 81mg  by mouth daily. Atorvastatin 40-80mg  PO QD (or other "high intensity" statin therapy). Daily walking to and past the point of discomfort. Will refer to supervised exercise program.  Follow-up in 3 months.  Will encourage her to follow-up with her primary care physician or orthopedist to evaluate degenerative spinal disease as a source for the symptoms.  CHIEF COMPLAINT: Leg pain  HISTORY OF PRESENT ILLNESS: Valerie Russell is a 63 y.o. female referred to clinic for evaluation of claudication symptoms.  The patient has a fairly mixed history for claudication.  She reports cramping pain in the thighs radiating to the legs.  She mostly notices this when walking, especially uphill.  Simply stopping walking does not always relieve her pain.  She has to sit down for symptom relief to occur.  She does have issues with chronic lower back pain and has been diagnosed with degenerative disc disease.  She does not have any symptoms typical of rest pain or ischemic ulceration.  Past Medical History:  Diagnosis Date   Allergy    seasonal allergies   Anemia    on meds   Cervical cancer (HCC)    in her 79s pt states, laser surgery to remove   COPD (chronic obstructive pulmonary disease) (HCC)    uses inhaler   Diverticulitis    GERD (gastroesophageal reflux disease)    on meds   Hypertension    on meds   Lung cancer (HCC) 2022   per pt (12/09/2020)   Seasonal allergies     Past Surgical History:  Procedure Laterality Date   APPENDECTOMY     BIOPSY   05/10/2020   Procedure: BIOPSY;  Surgeon: Annis Kinder, DO;  Location: [facility];  Service: Gastroenterology;;   BIOPSY  12/23/2020   Procedure: BIOPSY;  Surgeon: Annis Kinder, DO;  Location: [facility];  Service: Gastroenterology;;   CESAREAN SECTION  05/04/1997   COLONOSCOPY WITH PROPOFOL  N/A 05/10/2020   Procedure: COLONOSCOPY WITH PROPOFOL ;  Surgeon: Annis Kinder, DO;  Location: [facility];  Service: Gastroenterology;  Laterality: N/A;  colonoscopy with EMR   COLONOSCOPY WITH PROPOFOL  N/A 12/23/2020   Procedure: COLONOSCOPY WITH PROPOFOL ;  Surgeon: Annis Kinder, DO;  Location: [facility];  Service: Gastroenterology;  Laterality: N/A;   ENDOSCOPIC MUCOSAL RESECTION N/A 05/10/2020   Procedure: ENDOSCOPIC MUCOSAL RESECTION;  Surgeon: Annis Kinder, DO;  Location: [facility];  Service: Gastroenterology;  Laterality: N/A;   ESOPHAGOGASTRODUODENOSCOPY (EGD) WITH PROPOFOL  N/A 05/10/2020   Procedure: ESOPHAGOGASTRODUODENOSCOPY (EGD) WITH PROPOFOL ;  Surgeon: Annis Kinder, DO;  Location: [facility];  Service: Gastroenterology;  Laterality: N/A;   HEMOSTASIS CLIP PLACEMENT  05/10/2020   Procedure: HEMOSTASIS CLIP PLACEMENT;  Surgeon: Annis Kinder, DO;  Location: [facility];  Service: Gastroenterology;;   LASER ABLATION CONDYLOMA CERVICAL / VULVAR     POLYPECTOMY  12/23/2020   Procedure: POLYPECTOMY;  Surgeon: Annis Kinder, DO;  Location: [facility];  Service: Gastroenterology;;   SUBMUCOSAL LIFTING INJECTION  05/10/2020   Procedure: SUBMUCOSAL LIFTING INJECTION;  Surgeon: Annis Kinder, DO;  Location: [facility];  Service: Gastroenterology;;  WISDOM TOOTH EXTRACTION      Family History  Problem Relation Age of Onset   Cancer Other    Hypertension Mother    COPD Mother    Stroke Mother    Colon cancer Neg Hx    Colon polyps Neg Hx    Esophageal cancer Neg Hx    Rectal cancer Neg Hx    Stomach cancer Neg Hx     Social  History   Socioeconomic History   Marital status: Married    Spouse name: Not on file   Number of children: 1   Years of education: Not on file   Highest education level: Not on file  Occupational History   Not on file  Tobacco Use   Smoking status: Every Day    Current packs/day: 0.25    Average packs/day: 0.3 packs/day for 25.0 years (6.3 ttl pk-yrs)    Types: Cigarettes   Smokeless tobacco: Never  Vaping Use   Vaping status: Never Used  Substance and Sexual Activity   Alcohol use: Yes    Alcohol/week: 4.0 standard drinks of alcohol    Types: 4 Standard drinks or equivalent per week    Comment: occasionally   Drug use: Not Currently    Types: Marijuana    Comment: sometimes   Sexual activity: Yes    Birth control/protection: None  Other Topics Concern   Not on file  Social History Narrative   Not on file   Social Drivers of Health   Financial Resource Strain: Not on file  Food Insecurity: Not on file  Transportation Needs: Not on file  Physical Activity: Not on file  Stress: Not on file  Social Connections: Unknown (08/07/2022)   Received from [facility], [facility]   Social Network    Social Network: Not on file  Intimate Partner Violence: Unknown (08/07/2022)   Received from [employer], [facility]   HITS    Physically Hurt: Not on file    Insult or Talk Down To: Not on file    Threaten Physical Harm: Not on file    Scream or Curse: Not on file    No Known Allergies  Current Outpatient Medications  Medication Sig Dispense Refill   albuterol  (VENTOLIN  HFA) 108 (90 Base) MCG/ACT inhaler Inhale 2 puffs into the lungs every 6 (six) hours as needed for wheezing or shortness of breath. F/u for further refills Fill insurance preference. 18 g 0   amoxicillin -clavulanate (AUGMENTIN ) 875-125 MG tablet Take 1 tablet by mouth every 12 (twelve) hours. 14 tablet 0   cetirizine (ZYRTEC) 10 MG tablet Take 10 mg by mouth daily.     esomeprazole (NEXIUM) 20 MG  capsule Take 20 mg by mouth daily at 12 noon.     folic acid (FOLVITE) 1 MG tablet Take 1 mg by mouth daily.     lidocaine  (LIDODERM ) 5 % Place 1 patch onto the skin daily. Remove & Discard patch within 12 hours or as directed by MD 30 patch 0   lisinopril  (ZESTRIL ) 20 MG tablet Take 1 tablet by mouth once daily 30 tablet 1   Magnesium 500 MG TABS Take 500 mg by mouth daily.     Multiple Vitamin (MULTIVITAMIN PO) Take 1 tablet by mouth daily.     umeclidinium bromide  (INCRUSE ELLIPTA ) 62.5 MCG/ACT AEPB Inhale 1 puff into the lungs daily. 1 each 4   VENTOLIN  HFA 108 (90 Base) MCG/ACT inhaler INHALE 1 TO 2 PUFFS INTO LUNGS EVERY 6 HOURS  AS NEEDED FOR WHEEZING OR SHORTNESS OF BREATH 18 g 1   vitamin B-12 (CYANOCOBALAMIN) 1000 MCG tablet Take 1,000 mcg by mouth daily.     No current facility-administered medications for this visit.    PHYSICAL EXAM Vitals:   01/01/24 1014  BP: (!) 181/83  Pulse: 96  Temp: 98.3 F (36.8 C)  TempSrc: Temporal  SpO2: 97%  Weight: 126 lb (57.2 kg)  Height: 5\' 3"  (1.6 m)    Chronically ill woman in no distress Regular rate and rhythm Unlabored breathing Weakly palpable posterior tibial pulses bilaterally    PERTINENT LABORATORY AND RADIOLOGIC DATA  Most recent CBC    Latest Ref Rng & Units 11/24/2023    6:03 PM 08/14/2023    4:05 PM 07/05/2023    3:02 PM  CBC  WBC 4.0 - 10.5 K/uL 11.3  5.4  6.6   Hemoglobin 12.0 - 15.0 g/dL 9.2  16.1  09.6   Hematocrit 36.0 - 46.0 % 28.3  31.7  35.2   Platelets 150 - 400 K/uL 432  389  360      Most recent CMP    Latest Ref Rng & Units 11/24/2023    6:03 PM 08/14/2023    4:05 PM 07/05/2023    3:02 PM  CMP  Glucose 70 - 99 mg/dL 045  81  409   BUN 8 - 23 mg/dL 11  15  29    Creatinine 0.44 - 1.00 mg/dL 8.11  9.14  7.82   Sodium 135 - 145 mmol/L 137  135  135   Potassium 3.5 - 5.1 mmol/L 4.3  3.9  4.3   Chloride 98 - 111 mmol/L 102  99  98   CO2 22 - 32 mmol/L 23  25  25    Calcium 8.9 - 10.3 mg/dL 9.9   95.6  9.8   Total Protein 6.5 - 8.1 g/dL 7.1  7.7    Total Bilirubin 0.0 - 1.2 mg/dL 0.2  0.3    Alkaline Phos 38 - 126 U/L 117  80    AST 15 - 41 U/L 18  26    ALT 0 - 44 U/L 10  19      Renal function CrCl cannot be calculated (Patient's most recent lab result is older than the maximum 21 days allowed.).  No results found for: "HGBA1C"  LDL Cholesterol  Date Value Ref Range Status  12/23/2019 142 (H) 0 - 99 mg/dL Final   Direct LDL  Date Value Ref Range Status  12/23/2019 144.0 mg/dL Final    Comment:    Optimal:  <100 mg/dLNear or Above Optimal:  100-129 mg/dLBorderline High:  130-159 mg/dLHigh:  160-189 mg/dLVery High:  >190 mg/dL     +-------+-----------+-----------+------------+------------+  ABI/TBIToday's ABIToday's TBIPrevious ABIPrevious TBI  +-------+-----------+-----------+------------+------------+  Right 1.02       0.67       0.97                      +-------+-----------+-----------+------------+------------+  Left  1.16       073        0.99                      +-------+-----------+-----------+------------+------------+   Heber Little. Edgardo Goodwill, MD FACS [facility] Phone Number: [phone] 12/31/2023 8:52 AM   Total time spent on preparing this encounter including chart review, data review, collecting history, examining the patient, and coordinating  care: 45 minutes  Portions of this report may have been transcribed using voice recognition software.  Every effort has been made to ensure accuracy; however, inadvertent computerized transcription errors may still be present.

## 2024-01-01 ENCOUNTER — Ambulatory Visit: Admitting: Vascular Surgery

## 2024-01-01 ENCOUNTER — Ambulatory Visit (HOSPITAL_COMMUNITY)
Admission: RE | Admit: 2024-01-01 | Discharge: 2024-01-01 | Disposition: A | Discharge status: Home / Self Care | Source: Ambulatory Visit | Attending: Vascular Surgery | Admitting: Vascular Surgery

## 2024-01-01 VITALS — BP 181/83 | HR 96 | Temp 98.3°F | Ht 63.0 in | Wt 126.0 lb

## 2024-01-01 DIAGNOSIS — I700 Atherosclerosis of aorta: Secondary | ICD-10-CM

## 2024-01-01 DIAGNOSIS — M79604 Pain in right leg: Secondary | ICD-10-CM | POA: Diagnosis present

## 2024-01-01 DIAGNOSIS — M79605 Pain in left leg: Secondary | ICD-10-CM

## 2024-01-01 LAB — VAS US ABI WITH/WO TBI
Left ABI: 1.16
Right ABI: 1.02

## 2024-01-02 ENCOUNTER — Ambulatory Visit: Admitting: Gastroenterology

## 2024-01-03 ENCOUNTER — Other Ambulatory Visit: Payer: Self-pay | Admitting: *Deleted

## 2024-01-03 DIAGNOSIS — M79604 Pain in right leg: Secondary | ICD-10-CM

## 2024-01-25 ENCOUNTER — Ambulatory Visit: Admitting: Sports Medicine

## 2024-02-04 ENCOUNTER — Inpatient Hospital Stay

## 2024-02-04 ENCOUNTER — Telehealth: Payer: Self-pay | Admitting: *Deleted

## 2024-02-07 ENCOUNTER — Telehealth: Payer: Self-pay | Admitting: *Deleted

## 2024-02-07 NOTE — Telephone Encounter (Signed)
 TCT patient at Dr. Lonn request: Lab appt on 7/7 cxld as patient had lab work in ED 02/03/24. Ms. Birchmeier is currently scheduled for MD appt on 7/15 for f/u. Per Dr. Lonn, she has reviewed ED labs. Appt for 7/15 can be cxld and offer patient appt with MD in 2 months with labs the week before.   Patient not available - LVM with message above and requested patient contact office either by phone or MyChart to schedule appt in September.

## 2024-02-08 ENCOUNTER — Telehealth: Payer: Self-pay

## 2024-02-08 NOTE — Telephone Encounter (Signed)
 Patient informed that based on recent lab work, patient does not need to be seen by Dr. Lonn on 7/15.  Dr. Lonn would like to have patient follow-up again in 74-months time for lab work and follow-up visit the following week.  Patient scheduled for lab appointment on 8/26 and MD visit on 9/2. Appointment dates and times confirmed. Patient voiced appreciation for call and knows to contact the office in the interim should she have any questions or concerns.

## 2024-02-12 ENCOUNTER — Ambulatory Visit: Admitting: Hematology and Oncology

## 2024-02-13 NOTE — Telephone Encounter (Signed)
 No entry

## 2024-03-12 ENCOUNTER — Ambulatory Visit: Admitting: Sports Medicine

## 2024-03-12 ENCOUNTER — Ambulatory Visit: Admitting: Gastroenterology

## 2024-03-18 ENCOUNTER — Ambulatory Visit (INDEPENDENT_AMBULATORY_CARE_PROVIDER_SITE_OTHER): Admitting: Gastroenterology

## 2024-03-18 ENCOUNTER — Encounter: Payer: Self-pay | Admitting: Gastroenterology

## 2024-03-18 ENCOUNTER — Other Ambulatory Visit: Payer: Self-pay

## 2024-03-18 VITALS — BP 176/76 | HR 108 | Ht 62.0 in | Wt 128.1 lb

## 2024-03-18 DIAGNOSIS — K5520 Angiodysplasia of colon without hemorrhage: Secondary | ICD-10-CM

## 2024-03-18 DIAGNOSIS — K921 Melena: Secondary | ICD-10-CM

## 2024-03-18 DIAGNOSIS — D509 Iron deficiency anemia, unspecified: Secondary | ICD-10-CM | POA: Diagnosis not present

## 2024-03-18 DIAGNOSIS — Z860100 Personal history of colon polyps, unspecified: Secondary | ICD-10-CM

## 2024-03-18 DIAGNOSIS — K5900 Constipation, unspecified: Secondary | ICD-10-CM | POA: Diagnosis not present

## 2024-03-18 DIAGNOSIS — K649 Unspecified hemorrhoids: Secondary | ICD-10-CM | POA: Diagnosis not present

## 2024-03-18 DIAGNOSIS — K648 Other hemorrhoids: Secondary | ICD-10-CM

## 2024-03-18 DIAGNOSIS — K5730 Diverticulosis of large intestine without perforation or abscess without bleeding: Secondary | ICD-10-CM

## 2024-03-18 DIAGNOSIS — K5732 Diverticulitis of large intestine without perforation or abscess without bleeding: Secondary | ICD-10-CM

## 2024-03-18 DIAGNOSIS — K219 Gastro-esophageal reflux disease without esophagitis: Secondary | ICD-10-CM

## 2024-03-18 DIAGNOSIS — Z8719 Personal history of other diseases of the digestive system: Secondary | ICD-10-CM

## 2024-03-18 MED ORDER — NA SULFATE-K SULFATE-MG SULF 17.5-3.13-1.6 GM/177ML PO SOLN
1.0000 | Freq: Once | ORAL | 0 refills | Status: AC
Start: 2024-03-18 — End: 2024-03-18

## 2024-03-18 NOTE — Progress Notes (Signed)
 Chief Complaint:    Rectal bleeding, constipation, iron  deficiency anemia  GI History: 63 year old female with a history of COPD, HTN, tobacco use, diverticulosis with history of diverticulitis, follows in the GI clinic for the following:  History of symptomatic hemorrhoids (intermittent BRBPR, rectal itching/irritation).  - 09/26/21: Banding of LL hemorrhoid - 10/25/2021: Banding of RA hemorrhoid    Separately, history of IDA.  Was evaluated with EGD/colonoscopy along with VCE as below.  Follows in the Hematology Clinic. Has not been responsive to oral iron  in the past, and has been treated with multiple doses of IV iron  over the last few years.  She was last seen in the Hematology Clinic 12/04/2023 and again recommended IV iron .   Previously evaluated with EGD and colonoscopy in 04/2020, repeat colonoscopy in 2022, and VCE in 2023 as above.  IDA mainly attributed to chronic, slow GI blood loss small bowel erosions and possibly AVMs.    Endoscopic History: - Colonoscopy (02/2020): 3 subcentimeter adenomas in the descending/ascending colon, 8 mm TA in sigmoid, 6 smaller sigmoid hyperplastic polyps.  25 mm polyp in the proximal rectum not resected in favor of EMR at the hospital.  Single small cecal AVM.  Internal hemorrhoids, sigmoid diverticulosis.  Normal TI - Colonoscopy (04/2020): 30 mm tubular adenoma with focal high grade dysplasia via piecemeal technique EMR.  Sigmoid diverticulosis - EGD (04/2020): 3 cm HH, otherwise normal - Colonoscopy (11/2020): Small cecal AVM, 4 sigmoid hyperplastic polyps ranging 1-3 mm (path:), 3 benign rectal hyperplastic polyps.  Post polypectomy scar in rectum (biopsied: Normal, benign tissue).  Sigmoid diverticulosis, medium sized grade 2 internal hemorrhoids.  Repeat in 3 years - Video Capsule Endoscopy (10/2021): A few small, nonbleeding erosions in the proximal small bowel which may represent etiology of IDA.  Recommended stopping NSAIDs and continue monitoring of  labs  HPI:     Patient is a 63 y.o. female presenting to the Gastroenterology Clinic for evaluation of constipation and intermittent rectal bleeding.  History of internal hemorrhoids and underwent hemorrhoid banding in 2023 as outlined above.  Reports symptoms had improved significantly with last banding on 10/17/2021.  Recently has had intermittent BRBPR once every 1-2 months, described as small volume BRB on the tissue paper.  Last episode was yesterday after constipation with straining to have BM.  Continues to follow with Dr. Lonn in the Hematology Clinic for IDA.  Has not been responsive to oral iron  in the past, and has been treated with multiple doses of IV iron  over the last few years.  She was last seen in the Hematology Clinic 12/04/2023 and again recommended IV iron .   Previously evaluated with EGD and colonoscopy in 04/2020, repeat colonoscopy in 2022, and VCE in 2023 as above.  IDA mainly attributed to chronic, slow GI blood loss small bowel erosions and possibly AVMs.    She is otherwise due for repeat colonoscopy for ongoing polyp surveillance.  H/H has mainly been 10.4-11.9 for the last 18 months or so with most recent labs as below:  - 01/19/2023: Ferritin 7, iron  20, TIBC 470, sat 4% with H/H10.3/30.7 -11/24/2023: H/H 9.2/20.3, MCV 90, RDW 16.6 -02/03/2024: H/H 11.6/35.2 with MCV 99.7.  BG 151, otherwise normal CMP  She does take Aleve regularly for back pain, but has reduced how often she is using it.   Separately, was diagnosed with acute uncomplicated descending colon diverticulitis by CT on 11/24/2023, treated with Augmentin  with resolution.  Had PNA last month, treated with 2 rounds of steroids  and Abx.   Review of systems:     No chest pain, no SOB, no fevers, no urinary sx   Past Medical History:  Diagnosis Date   Allergy    seasonal allergies   Anemia    on meds   Cervical cancer (HCC)    in her 72s pt states, laser surgery to remove   COPD (chronic obstructive  pulmonary disease) (HCC)    uses inhaler   Diverticulitis    GERD (gastroesophageal reflux disease)    on meds   Hypertension    on meds   Lung cancer (HCC) 2022   per pt (12/09/2020)   Seasonal allergies     Patient's surgical history, family medical history, social history, medications and allergies were all reviewed in Epic    Current Outpatient Medications  Medication Sig Dispense Refill   albuterol  (VENTOLIN  HFA) 108 (90 Base) MCG/ACT inhaler Inhale 2 puffs into the lungs every 6 (six) hours as needed for wheezing or shortness of breath. F/u for further refills Fill insurance preference. 18 g 0   cetirizine (ZYRTEC) 10 MG tablet Take 10 mg by mouth daily.     esomeprazole (NEXIUM) 20 MG capsule Take 20 mg by mouth daily at 12 noon.     folic acid (FOLVITE) 1 MG tablet Take 1 mg by mouth daily.     HYDROcodone -acetaminophen  (NORCO/VICODIN) 5-325 MG tablet Take 1 tablet by mouth as needed.     lisinopril  (ZESTRIL ) 20 MG tablet Take 1 tablet by mouth once daily 30 tablet 1   Magnesium 500 MG TABS Take 500 mg by mouth daily.     Multiple Vitamin (MULTIVITAMIN PO) Take 1 tablet by mouth daily.     naproxen sodium (ALEVE) 220 MG tablet Take 220 mg by mouth as needed.     neomycin-polymyxin b-dexamethasone (MAXITROL) 3.5-10000-0.1 SUSP Place 1 drop into the right eye 2 (two) times daily.     umeclidinium bromide  (INCRUSE ELLIPTA ) 62.5 MCG/ACT AEPB Inhale 1 puff into the lungs daily. 1 each 4   vitamin B-12 (CYANOCOBALAMIN) 1000 MCG tablet Take 1,000 mcg by mouth daily.     No current facility-administered medications for this visit.    Physical Exam:     BP (!) 176/76 (BP Location: Left Arm, Patient Position: Sitting, Cuff Size: Normal)   Pulse (!) 108   Ht 5' 2 (1.575 m) Comment: height measured without shoes  Wt 128 lb 2 oz (58.1 kg)   BMI 23.43 kg/m   GENERAL:  Pleasant female in NAD PSYCH: : Cooperative, normal affect CARDIAC:  RRR, no murmur heard, no peripheral  edema PULM: Normal respiratory effort, lungs CTA bilaterally, no wheezing ABDOMEN:  Nondistended, soft, nontender. No obvious masses, no hepatomegaly,  normal bowel sounds SKIN:  turgor, no lesions seen Musculoskeletal:  Normal muscle tone, normal strength NEURO: Alert and oriented x 3, no focal neurologic deficits   IMPRESSION and PLAN:    1) History of colon polyps - Due for repeat colonoscopy for ongoing polyp surveillance - Will schedule colonoscopy as below  2) Iron  deficiency anemia Has had good response to IV iron  in the past.  Did not tolerate oral iron .  Presumably from slow, mostly occult GI blood loss which may be from NSAID induced small bowel erosions seen on prior VCE, or at least 1 AVM noted on prior colonoscopy.  Discussed GI sources at length today with plan for the following: - Colonoscopy and push enteroscopy to be scheduled in the hospital endoscopy unit  for additional endoscopic capabilities for diagnostic and therapeutic intent - Continue follow-up in the Hematology clinic with labs and additional iron  as needed per protocol - Patient planning on travel from 9/8 - 9/22.  Per her preference, will be done after she returns as she is otherwise clinically stable  3) Symptomatic hemorrhoids 4) Hematochezia 5) Constipation Did have good response to hemorrhoid banding in the past, but recent exacerbation of known hemorrhoids with constipation and straining.  Increase fluids, okay to use stool softener, laxatives, etc. to facilitate soft stools without straining to have BM. - Evaluate at time of colonoscopy as above  6) GERD - Well-controlled on Nexium  7) Colonic AVM - Colon noscapine as above for diagnostic and therapeutic intent  8) Recent CAP - 4 weeks of probiotic after recent completion of antibiotics - Postponing endoscopic procedures until after upcoming travel which will also allow for healing after recent pneumonia  9) Diverticulitis Recent episode of  acute, uncomplicated DC diverticulitis in April. - Evaluate for appropriate mucosal healing at time of colonoscopy as above  The indications, risks, and benefits of EGD and colonoscopy were explained to the patient in detail. Risks include but are not limited to bleeding, perforation, adverse reaction to medications, and cardiopulmonary compromise. Sequelae include but are not limited to the possibility of surgery, hospitalization, and mortality. The patient verbalized understanding and wished to proceed. All questions answered, referred to scheduler and bowel prep ordered. Further recommendations pending results of the exam.        Sandor LULLA Flatter ,DO, FACG 03/18/2024, 3:02 PM

## 2024-03-18 NOTE — Patient Instructions (Signed)
 You have been scheduled for an endoscopy and colonoscopy. Please follow the written instructions given to you at your visit today.  If you use inhalers (even only as needed), please bring them with you on the day of your procedure.  DO NOT TAKE 7 DAYS PRIOR TO TEST- Trulicity (dulaglutide) Ozempic, Wegovy (semaglutide) Mounjaro (tirzepatide) Bydureon Bcise (exanatide extended release)  DO NOT TAKE 1 DAY PRIOR TO YOUR TEST Rybelsus (semaglutide) Adlyxin (lixisenatide) Victoza (liraglutide) Byetta (exanatide) ___________________________________________________________________________ _______________________________________________________  If your blood pressure at your visit was 140/90 or greater, please contact your primary care physician to follow up on this.  _______________________________________________________  If you are age 72 or older, your body mass index should be between 23-30. Your Body mass index is 23.43 kg/m. If this is out of the aforementioned range listed, please consider follow up with your Primary Care Provider.  If you are age 57 or younger, your body mass index should be between 19-25. Your Body mass index is 23.43 kg/m. If this is out of the aformentioned range listed, please consider follow up with your Primary Care Provider.   ________________________________________________________  The [facility] providers would like to encourage you to use MYCHART to communicate with providers for non-urgent requests or questions.  Due to long hold times on the telephone, sending your provider a message by [facility] may be a faster and more efficient way to get a response.  Please allow 48 business hours for a response.  Please remember that this is for non-urgent requests.  _______________________________________________________  [facility] is using a team-based approach to care.  Your team is made up of your doctor and two to three APPS. Our APPS (Nurse  Practitioners and Physician Assistants) work with your physician to ensure care continuity for you. They are fully qualified to address your health concerns and develop a treatment plan. They communicate directly with your gastroenterologist to care for you. Seeing the Advanced Practice Practitioners on your physician's team can help you by facilitating care more promptly, often allowing for earlier appointments, access to diagnostic testing, procedures, and other specialty referrals.

## 2024-03-25 ENCOUNTER — Inpatient Hospital Stay: Attending: Hematology and Oncology

## 2024-03-25 DIAGNOSIS — D75838 Other thrombocytosis: Secondary | ICD-10-CM | POA: Insufficient documentation

## 2024-03-25 DIAGNOSIS — D509 Iron deficiency anemia, unspecified: Secondary | ICD-10-CM | POA: Diagnosis present

## 2024-03-25 LAB — CBC WITH DIFFERENTIAL (CANCER CENTER ONLY)
Abs Immature Granulocytes: 0.03 K/uL (ref 0.00–0.07)
Basophils Absolute: 0.1 K/uL (ref 0.0–0.1)
Basophils Relative: 1 %
Eosinophils Absolute: 0.2 K/uL (ref 0.0–0.5)
Eosinophils Relative: 3 %
HCT: 30.5 % — ABNORMAL LOW (ref 36.0–46.0)
Hemoglobin: 10.3 g/dL — ABNORMAL LOW (ref 12.0–15.0)
Immature Granulocytes: 0 %
Lymphocytes Relative: 23 %
Lymphs Abs: 1.8 K/uL (ref 0.7–4.0)
MCH: 32.2 pg (ref 26.0–34.0)
MCHC: 33.8 g/dL (ref 30.0–36.0)
MCV: 95.3 fL (ref 80.0–100.0)
Monocytes Absolute: 0.6 K/uL (ref 0.1–1.0)
Monocytes Relative: 8 %
Neutro Abs: 5.3 K/uL (ref 1.7–7.7)
Neutrophils Relative %: 65 %
Platelet Count: 419 K/uL — ABNORMAL HIGH (ref 150–400)
RBC: 3.2 MIL/uL — ABNORMAL LOW (ref 3.87–5.11)
RDW: 13.5 % (ref 11.5–15.5)
WBC Count: 8.1 K/uL (ref 4.0–10.5)
nRBC: 0 % (ref 0.0–0.2)

## 2024-03-25 LAB — IRON AND IRON BINDING CAPACITY (CC-WL,HP ONLY)
Iron: 24 ug/dL — ABNORMAL LOW (ref 28–170)
Saturation Ratios: 5 % — ABNORMAL LOW (ref 10.4–31.8)
TIBC: 532 ug/dL — ABNORMAL HIGH (ref 250–450)
UIBC: 508 ug/dL — ABNORMAL HIGH (ref 148–442)

## 2024-03-25 LAB — FERRITIN: Ferritin: 18 ng/mL (ref 11–307)

## 2024-03-26 ENCOUNTER — Encounter: Payer: Self-pay | Admitting: Acute Care

## 2024-03-26 ENCOUNTER — Encounter: Payer: Self-pay | Admitting: *Deleted

## 2024-03-26 ENCOUNTER — Ambulatory Visit: Admitting: Acute Care

## 2024-03-31 NOTE — Progress Notes (Deleted)
 [facility] [place]  ASSESSMENT / PLAN: 63 y.o. 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  She has claudication symptoms which I think are neurogenic in origin.  Recommend:  Abstinence from all tobacco products. Blood glucose control with goal A1c < 7%. Blood pressure control with goal blood pressure < 130/80 mmHg. Lipid reduction therapy with goal LDL-C < 55 mg/dL. Aspirin 81mg  by mouth daily. Atorvastatin 40-80mg  PO QD (or other high intensity statin therapy). Daily walking to and past the point of discomfort. Will refer to supervised exercise program.  Follow-up in 3 months.  Will encourage her to follow-up with her primary care physician or orthopedist to evaluate degenerative spinal disease as a source for the symptoms.  CHIEF COMPLAINT: Leg pain  HISTORY OF PRESENT ILLNESS: Valerie Russell is a 64 y.o. female referred to clinic for evaluation of claudication symptoms.  The patient has a fairly mixed history for claudication.  She reports cramping pain in the thighs radiating to the legs.  She mostly notices this when walking, especially uphill.  Simply stopping walking does not always relieve her pain.  She has to sit down for symptom relief to occur.  She does have issues with chronic lower back pain and has been diagnosed with degenerative disc disease.  She does not have any symptoms typical of rest pain or ischemic ulceration.  Past Medical History:  Diagnosis Date   Allergy    seasonal allergies   Anemia    on meds   Cervical cancer (HCC)    in her 75s pt states, laser surgery to remove   COPD (chronic obstructive pulmonary disease) (HCC)    uses inhaler   Diverticulitis    GERD (gastroesophageal reflux disease)    on meds   Hypertension    on meds   Lung cancer (HCC) 2022   per pt (12/09/2020)   Seasonal allergies     Past Surgical History:  Procedure Laterality Date   APPENDECTOMY     BIOPSY   05/10/2020   Procedure: BIOPSY;  Surgeon: San Sandor GAILS, DO;  Location: [facility];  Service: Gastroenterology;;   BIOPSY  12/23/2020   Procedure: BIOPSY;  Surgeon: San Sandor GAILS, DO;  Location: [facility];  Service: Gastroenterology;;   CESAREAN SECTION  05/04/1997   COLONOSCOPY WITH PROPOFOL  N/A 05/10/2020   Procedure: COLONOSCOPY WITH PROPOFOL ;  Surgeon: San Sandor GAILS, DO;  Location: [facility];  Service: Gastroenterology;  Laterality: N/A;  colonoscopy with EMR   COLONOSCOPY WITH PROPOFOL  N/A 12/23/2020   Procedure: COLONOSCOPY WITH PROPOFOL ;  Surgeon: San Sandor GAILS, DO;  Location: [facility];  Service: Gastroenterology;  Laterality: N/A;   ENDOSCOPIC MUCOSAL RESECTION N/A 05/10/2020   Procedure: ENDOSCOPIC MUCOSAL RESECTION;  Surgeon: San Sandor GAILS, DO;  Location: [facility];  Service: Gastroenterology;  Laterality: N/A;   ESOPHAGOGASTRODUODENOSCOPY (EGD) WITH PROPOFOL  N/A 05/10/2020   Procedure: ESOPHAGOGASTRODUODENOSCOPY (EGD) WITH PROPOFOL ;  Surgeon: San Sandor GAILS, DO;  Location: [facility];  Service: Gastroenterology;  Laterality: N/A;   HEMOSTASIS CLIP PLACEMENT  05/10/2020   Procedure: HEMOSTASIS CLIP PLACEMENT;  Surgeon: San Sandor GAILS, DO;  Location: [facility];  Service: Gastroenterology;;   LASER ABLATION CONDYLOMA CERVICAL / VULVAR     POLYPECTOMY  12/23/2020   Procedure: POLYPECTOMY;  Surgeon: San Sandor GAILS, DO;  Location: [facility];  Service: Gastroenterology;;   SUBMUCOSAL LIFTING INJECTION  05/10/2020   Procedure: SUBMUCOSAL LIFTING INJECTION;  Surgeon: San Sandor GAILS, DO;  Location: [facility];  Service: Gastroenterology;;  WISDOM TOOTH EXTRACTION      Family History  Problem Relation Age of Onset   Cancer Other    Hypertension Mother    COPD Mother    Stroke Mother    Colon cancer Neg Hx    Colon polyps Neg Hx    Esophageal cancer Neg Hx    Rectal cancer Neg Hx    Stomach cancer Neg Hx     Social  History   Socioeconomic History   Marital status: Married    Spouse name: Not on file   Number of children: 1   Years of education: Not on file   Highest education level: Not on file  Occupational History   Not on file  Tobacco Use   Smoking status: Every Day    Current packs/day: 0.25    Average packs/day: 0.3 packs/day for 25.0 years (6.3 ttl pk-yrs)    Types: Cigarettes   Smokeless tobacco: Never  Vaping Use   Vaping status: Never Used  Substance and Sexual Activity   Alcohol use: Yes    Alcohol/week: 4.0 standard drinks of alcohol    Types: 4 Standard drinks or equivalent per week    Comment: occasionally   Drug use: Not Currently    Types: Marijuana    Comment: sometimes   Sexual activity: Yes    Birth control/protection: None  Other Topics Concern   Not on file  Social History Narrative   Not on file   Social Drivers of Health   Financial Resource Strain: Not on file  Food Insecurity: Not on file  Transportation Needs: Not on file  Physical Activity: Not on file  Stress: Not on file  Social Connections: Unknown (08/07/2022)   [facility]   Social Network    Social Network: Not on file  Intimate Partner Violence: Not At Risk (03/04/2024)   Received from [facility]   HITS    Over the last 12 months how often did your partner physically hurt you?: Never    Over the last 12 months how often did your partner insult you or talk down to you?: Never    Over the last 12 months how often did your partner threaten you with physical harm?: Never    Over the last 12 months how often did your partner scream or curse at you?: Never    No Known Allergies  Current Outpatient Medications  Medication Sig Dispense Refill   albuterol  (VENTOLIN  HFA) 108 (90 Base) MCG/ACT inhaler Inhale 2 puffs into the lungs every 6 (six) hours as needed for wheezing or shortness of breath. F/u for further refills Fill insurance preference. 18 g 0   cetirizine (ZYRTEC) 10 MG  tablet Take 10 mg by mouth daily.     esomeprazole (NEXIUM) 20 MG capsule Take 20 mg by mouth daily at 12 noon.     folic acid (FOLVITE) 1 MG tablet Take 1 mg by mouth daily.     HYDROcodone -acetaminophen  (NORCO/VICODIN) 5-325 MG tablet Take 1 tablet by mouth as needed.     lisinopril  (ZESTRIL ) 20 MG tablet Take 1 tablet by mouth once daily 30 tablet 1   Magnesium 500 MG TABS Take 500 mg by mouth daily.     Multiple Vitamin (MULTIVITAMIN PO) Take 1 tablet by mouth daily.     naproxen sodium (ALEVE) 220 MG tablet Take 220 mg by mouth as needed.     neomycin-polymyxin b-dexamethasone (MAXITROL) 3.5-10000-0.1 SUSP Place 1 drop into the right eye 2 (  two) times daily.     umeclidinium bromide  (INCRUSE ELLIPTA ) 62.5 MCG/ACT AEPB Inhale 1 puff into the lungs daily. 1 each 4   vitamin B-12 (CYANOCOBALAMIN) 1000 MCG tablet Take 1,000 mcg by mouth daily.     No current facility-administered medications for this visit.    PHYSICAL EXAM There were no vitals filed for this visit.   Chronically ill woman in no distress Regular rate and rhythm Unlabored breathing Weakly palpable posterior tibial pulses bilaterally    PERTINENT LABORATORY AND RADIOLOGIC DATA  Most recent CBC    Latest Ref Rng & Units 03/25/2024   10:58 AM 11/24/2023    6:03 PM 08/14/2023    4:05 PM  CBC  WBC 4.0 - 10.5 K/uL 8.1  11.3  5.4   Hemoglobin 12.0 - 15.0 g/dL 89.6  9.2  89.5   Hematocrit 36.0 - 46.0 % 30.5  28.3  31.7   Platelets 150 - 400 K/uL 419  432  389      Most recent CMP    Latest Ref Rng & Units 11/24/2023    6:03 PM 08/14/2023    4:05 PM 07/05/2023    3:02 PM  CMP  Glucose 70 - 99 mg/dL 884  81  860   BUN 8 - 23 mg/dL 11  15  29    Creatinine 0.44 - 1.00 mg/dL 8.84  8.99  8.29   Sodium 135 - 145 mmol/L 137  135  135   Potassium 3.5 - 5.1 mmol/L 4.3  3.9  4.3   Chloride 98 - 111 mmol/L 102  99  98   CO2 22 - 32 mmol/L 23  25  25    Calcium 8.9 - 10.3 mg/dL 9.9  89.7  9.8   Total Protein 6.5 - 8.1  g/dL 7.1  7.7    Total Bilirubin 0.0 - 1.2 mg/dL 0.2  0.3    Alkaline Phos 38 - 126 U/L 117  80    AST 15 - 41 U/L 18  26    ALT 0 - 44 U/L 10  19      Renal function CrCl cannot be calculated (Patient's most recent lab result is older than the maximum 21 days allowed.).  No results found for: HGBA1C  LDL Cholesterol  Date Value Ref Range Status  12/23/2019 142 (H) 0 - 99 mg/dL Final   Direct LDL  Date Value Ref Range Status  12/23/2019 144.0 mg/dL Final    Comment:    Optimal:  <100 mg/dLNear or Above Optimal:  100-129 mg/dLBorderline High:  130-159 mg/dLHigh:  160-189 mg/dLVery High:  >190 mg/dL     +-------+-----------+-----------+------------+------------+  ABI/TBIToday's ABIToday's TBIPrevious ABIPrevious TBI  +-------+-----------+-----------+------------+------------+  Right 1.02       0.67       0.97                      +-------+-----------+-----------+------------+------------+  Left  1.16       073        0.99                      +-------+-----------+-----------+------------+------------+   Debby SAILOR. Magda, MD [facility] Phone Number: [phone] 03/31/2024 5:37 PM   Total time spent on preparing this encounter including chart review, data review, collecting history, examining the patient, and coordinating care: 45 minutes  Portions of this report may have been transcribed using voice recognition software.  Every effort has been made to ensure accuracy; however, inadvertent computerized transcription errors may still be present.

## 2024-04-01 ENCOUNTER — Ambulatory Visit: Admitting: Vascular Surgery

## 2024-04-01 ENCOUNTER — Inpatient Hospital Stay: Attending: Hematology and Oncology | Admitting: Hematology and Oncology

## 2024-04-01 ENCOUNTER — Ambulatory Visit (HOSPITAL_COMMUNITY)

## 2024-04-01 ENCOUNTER — Encounter: Payer: Self-pay | Admitting: Hematology and Oncology

## 2024-04-01 VITALS — BP 138/83 | HR 117 | Temp 97.7°F | Resp 18 | Ht 62.0 in | Wt 127.5 lb

## 2024-04-01 DIAGNOSIS — D75838 Other thrombocytosis: Secondary | ICD-10-CM | POA: Insufficient documentation

## 2024-04-01 DIAGNOSIS — D509 Iron deficiency anemia, unspecified: Secondary | ICD-10-CM | POA: Insufficient documentation

## 2024-04-01 DIAGNOSIS — Q2730 Arteriovenous malformation, site unspecified: Secondary | ICD-10-CM | POA: Insufficient documentation

## 2024-04-01 NOTE — Assessment & Plan Note (Addendum)
 The elevated platelet count is likely due to low iron  I anticipate resolution with improvement of her anemia and iron  deficiency

## 2024-04-01 NOTE — Progress Notes (Signed)
   [facility] OFFICE PROGRESS NOTE  Valerie Crigler, MD  ASSESSMENT & PLAN:  Assessment & Plan Iron  deficiency anemia, unspecified iron  deficiency anemia type She has history of known AVM She is somewhat symptomatic from her anemia I recommend 2 doses of intravenous iron  and plan to see her again in 2 months for further follow-up  She has appointment to follow-up with GI soon Reactive thrombocytosis The elevated platelet count is likely due to low iron  I anticipate resolution with improvement of her anemia and iron  deficiency    Orders Placed This Encounter  Procedures   Ferritin    Standing Status:   Future    Expiration Date:   04/01/2025   Iron  and Iron  Binding Capacity ([facility] only)    Standing Status:   Future    Expiration Date:   04/01/2025   CBC with Differential (Cancer Center Only)    Standing Status:   Future    Expiration Date:   04/01/2025    INTERVAL HISTORY: Patient returns for recurrent anemia Symptoms of anemia includes fatigue and pallor We reviewed CBC and iron  studies today She felt dramatic improvement after recent intravenous iron  infusion but started to feel fatigued again  SUMMARY OF HEMATOLOGIC HISTORY:  She was found to have abnormal CBC from blood work I have reviewed her CBC dated back to 2021 She used to have history of normal hemoglobin but starting in 2021, she become anemic may have microcytosis She had numerous endoscopy evaluation as listed: - Colonoscopy (02/2020): 3 subcentimeter adenomas in the descending/ascending colon, 8 mm TA in sigmoid, 6 smaller sigmoid hyperplastic polyps.  25 mm polyp in the proximal rectum not resected in favor of EMR at the hospital.  Single small cecal AVM.  Internal hemorrhoids, sigmoid diverticulosis.  Normal TI - Colonoscopy (04/2020): 30 mm tubular adenoma with focal high grade dysplasia via piecemeal technique EMR.  Sigmoid diverticulosis - EGD (04/2020): 3 cm HH, otherwise normal - Colonoscopy  (11/2020): Small cecal AVM, 4 sigmoid hyperplastic polyps ranging 1-3 mm (path:), 3 benign rectal hyperplastic polyps.  Post polypectomy scar in rectum (biopsied: Normal, benign tissue).  Sigmoid diverticulosis, medium sized grade 2 internal hemorrhoids.   She hasrecent chest pain on exertion, shortness of breath on minimal exertion, pre-syncopal episodes, palpitations, leg cramps and fatigue. She had not noticed any recent bleeding such as epistaxis, hematuria or hematochezia The patient takes Aleve regularly for chronic back pain. She is not on antiplatelets agents. Her last colonoscopy was last year She had no prior history or diagnosis of cancer but but have history of abnormal Pap smear, treated with laser therapy. Her age appropriate screening programs are up-to-date. She denies any pica and eats a variety of diet. She donated blood once but has never received blood transfusion The patient was prescribed oral iron  supplements and she takes 1 daily for 2 years without success of improving her blood count She stated that the oral iron  supplement cause constipation and bloating From 2023-2025, she has received multiple doses of intravenous iron  infusion  Lab Results  Component Value Date   VITAMINB12 367 12/27/2021   FERRITIN 18 03/25/2024   Vitals:   04/01/24 1030  BP: 138/83  Pulse: (!) 117  Resp: 18  Temp: 97.7 F (36.5 C)  SpO2: 100%

## 2024-04-01 NOTE — Assessment & Plan Note (Addendum)
 She has history of known AVM She is somewhat symptomatic from her anemia I recommend 2 doses of intravenous iron  and plan to see her again in 2 months for further follow-up  She has appointment to follow-up with GI soon

## 2024-04-02 ENCOUNTER — Ambulatory Visit (INDEPENDENT_AMBULATORY_CARE_PROVIDER_SITE_OTHER)

## 2024-04-02 VITALS — BP 154/86 | HR 90 | Temp 97.7°F | Resp 20 | Ht 63.0 in | Wt 129.4 lb

## 2024-04-02 DIAGNOSIS — D509 Iron deficiency anemia, unspecified: Secondary | ICD-10-CM

## 2024-04-02 MED ORDER — DIPHENHYDRAMINE HCL 25 MG PO CAPS
25.0000 mg | ORAL_CAPSULE | Freq: Once | ORAL | Status: DC
Start: 2024-04-02 — End: 2024-04-02

## 2024-04-02 MED ORDER — SODIUM CHLORIDE 0.9 % IV SOLN
510.0000 mg | Freq: Once | INTRAVENOUS | Status: AC
  Administered 2024-04-02: 510 mg via INTRAVENOUS
  Filled 2024-04-02: qty 17

## 2024-04-02 MED ORDER — ACETAMINOPHEN 325 MG PO TABS: 650.0000 mg | ORAL_TABLET | Freq: Once | ORAL | Status: DC

## 2024-04-02 NOTE — Progress Notes (Signed)
 Diagnosis: Iron  Deficiency Anemia  Provider:  Praveen Mannam MD  Procedure: IV Infusion  IV Type: Peripheral, IV Location: R Forearm  Feraheme (Ferumoxytol ), Dose: 510 mg  Infusion Start Time: 1434  Infusion Stop Time : 1453  Post Infusion IV Care: Patient declined observation and Peripheral IV Discontinued  Discharge: Condition: Good, Destination: Home . AVS Declined  Performed by:  Dakisha Schoof, RN   =

## 2024-05-01 ENCOUNTER — Ambulatory Visit

## 2024-05-01 VITALS — BP 176/90 | HR 91 | Temp 99.1°F | Resp 16 | Ht 64.0 in | Wt 126.2 lb

## 2024-05-01 DIAGNOSIS — D509 Iron deficiency anemia, unspecified: Secondary | ICD-10-CM

## 2024-05-01 MED ORDER — SODIUM CHLORIDE 0.9 % IV SOLN
510.0000 mg | Freq: Once | INTRAVENOUS | Status: AC
  Administered 2024-05-01: 510 mg via INTRAVENOUS
  Filled 2024-05-01: qty 17

## 2024-05-01 MED ORDER — DIPHENHYDRAMINE HCL 25 MG PO CAPS: 25.0000 mg | ORAL_CAPSULE | Freq: Once | ORAL | Status: DC

## 2024-05-01 MED ORDER — ACETAMINOPHEN 325 MG PO TABS: 650.0000 mg | ORAL_TABLET | Freq: Once | ORAL | Status: DC

## 2024-05-01 NOTE — Progress Notes (Signed)
 Diagnosis: Iron  Deficiency Anemia  Provider:  Lonna Coder MD  Procedure: IV Infusion  IV Type: Peripheral, IV Location: L Forearm  Feraheme (Ferumoxytol ), Dose: 510 mg  Infusion Start Time: 1211  Infusion Stop Time: 1228  Post Infusion IV Care: Patient declined observation and Peripheral IV Discontinued  Discharge: Condition: Good, Destination: Home . AVS Declined  Performed by:  Leita FORBES Miles, LPN

## 2024-05-12 NOTE — Progress Notes (Deleted)
 [facility] [place]  ASSESSMENT / PLAN: 63 y.o. 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  She has claudication symptoms which I think are neurogenic in origin.  Recommend:  Abstinence from all tobacco products. Blood glucose control with goal A1c < 7%. Blood pressure control with goal blood pressure < 130/80 mmHg. Lipid reduction therapy with goal LDL-C < 55 mg/dL. Aspirin 81mg  by mouth daily. Atorvastatin 40-80mg  PO QD (or other high intensity statin therapy). Daily walking to and past the point of discomfort. Will refer to supervised exercise program.  Follow-up in 3 months.  Will encourage her to follow-up with her primary care physician or orthopedist to evaluate degenerative spinal disease as a source for the symptoms.  CHIEF COMPLAINT: Leg pain  HISTORY OF PRESENT ILLNESS: Valerie Russell is a 63 y.o. female referred to clinic for evaluation of claudication symptoms.  The patient has a fairly mixed history for claudication.  She reports cramping pain in the thighs radiating to the legs.  She mostly notices this when walking, especially uphill.  Simply stopping walking does not always relieve her pain.  She has to sit down for symptom relief to occur.  She does have issues with chronic lower back pain and has been diagnosed with degenerative disc disease.  She does not have any symptoms typical of rest pain or ischemic ulceration.  Past Medical History:  Diagnosis Date   Allergy    seasonal allergies   Anemia    on meds   Cervical cancer (HCC)    in her 68s pt states, laser surgery to remove   COPD (chronic obstructive pulmonary disease) (HCC)    uses inhaler   Diverticulitis    GERD (gastroesophageal reflux disease)    on meds   Hypertension    on meds   Lung cancer (HCC) 2022   per pt (12/09/2020)   Seasonal allergies     Past Surgical History:  Procedure Laterality Date   APPENDECTOMY     BIOPSY   05/10/2020   Procedure: BIOPSY;  Surgeon: San Sandor GAILS, DO;  Location: [facility];  Service: Gastroenterology;;   BIOPSY  12/23/2020   Procedure: BIOPSY;  Surgeon: San Sandor GAILS, DO;  Location: [facility];  Service: Gastroenterology;;   CESAREAN SECTION  05/04/1997   COLONOSCOPY WITH PROPOFOL  N/A 05/10/2020   Procedure: COLONOSCOPY WITH PROPOFOL ;  Surgeon: San Sandor GAILS, DO;  Location: [facility];  Service: Gastroenterology;  Laterality: N/A;  colonoscopy with EMR   COLONOSCOPY WITH PROPOFOL  N/A 12/23/2020   Procedure: COLONOSCOPY WITH PROPOFOL ;  Surgeon: San Sandor GAILS, DO;  Location: [facility];  Service: Gastroenterology;  Laterality: N/A;   ENDOSCOPIC MUCOSAL RESECTION N/A 05/10/2020   Procedure: ENDOSCOPIC MUCOSAL RESECTION;  Surgeon: San Sandor GAILS, DO;  Location: [facility];  Service: Gastroenterology;  Laterality: N/A;   ESOPHAGOGASTRODUODENOSCOPY (EGD) WITH PROPOFOL  N/A 05/10/2020   Procedure: ESOPHAGOGASTRODUODENOSCOPY (EGD) WITH PROPOFOL ;  Surgeon: San Sandor GAILS, DO;  Location: [facility];  Service: Gastroenterology;  Laterality: N/A;   HEMOSTASIS CLIP PLACEMENT  05/10/2020   Procedure: HEMOSTASIS CLIP PLACEMENT;  Surgeon: San Sandor GAILS, DO;  Location: [facility];  Service: Gastroenterology;;   LASER ABLATION CONDYLOMA CERVICAL / VULVAR     POLYPECTOMY  12/23/2020   Procedure: POLYPECTOMY;  Surgeon: San Sandor GAILS, DO;  Location: [facility];  Service: Gastroenterology;;   SUBMUCOSAL LIFTING INJECTION  05/10/2020   Procedure: SUBMUCOSAL LIFTING INJECTION;  Surgeon: San Sandor GAILS, DO;  Location: [facility];  Service: Gastroenterology;;  WISDOM TOOTH EXTRACTION      Family History  Problem Relation Age of Onset   Cancer Other    Hypertension Mother    COPD Mother    Stroke Mother    Colon cancer Neg Hx    Colon polyps Neg Hx    Esophageal cancer Neg Hx    Rectal cancer Neg Hx    Stomach cancer Neg Hx     Social  History   Socioeconomic History   Marital status: Married    Spouse name: Not on file   Number of children: 1   Years of education: Not on file   Highest education level: Not on file  Occupational History   Not on file  Tobacco Use   Smoking status: Every Day    Current packs/day: 0.25    Average packs/day: 0.3 packs/day for 25.0 years (6.3 ttl pk-yrs)    Types: Cigarettes   Smokeless tobacco: Never  Vaping Use   Vaping status: Never Used  Substance and Sexual Activity   Alcohol use: Yes    Alcohol/week: 4.0 standard drinks of alcohol    Types: 4 Standard drinks or equivalent per week    Comment: occasionally   Drug use: Not Currently    Types: Marijuana    Comment: sometimes   Sexual activity: Yes    Birth control/protection: None  Other Topics Concern   Not on file  Social History Narrative   Not on file   Social Drivers of Health   Financial Resource Strain: Not on file  Food Insecurity: Not on file  Transportation Needs: Not on file  Physical Activity: Not on file  Stress: Not on file  Social Connections: Unknown (08/07/2022)   Received from [facility]   Social Network    Social Network: Not on file  Intimate Partner Violence: Not At Risk (03/04/2024)   Received from [facility]   HITS    Over the last 12 months how often did your partner physically hurt you?: Never    Over the last 12 months how often did your partner insult you or talk down to you?: Never    Over the last 12 months how often did your partner threaten you with physical harm?: Never    Over the last 12 months how often did your partner scream or curse at you?: Never    No Known Allergies  Current Outpatient Medications  Medication Sig Dispense Refill   albuterol  (VENTOLIN  HFA) 108 (90 Base) MCG/ACT inhaler Inhale 2 puffs into the lungs every 6 (six) hours as needed for wheezing or shortness of breath. F/u for further refills Fill insurance preference. 18 g 0   cetirizine (ZYRTEC) 10 MG  tablet Take 10 mg by mouth daily.     esomeprazole (NEXIUM) 20 MG capsule Take 20 mg by mouth daily at 12 noon.     folic acid (FOLVITE) 1 MG tablet Take 1 mg by mouth daily.     HYDROcodone -acetaminophen  (NORCO/VICODIN) 5-325 MG tablet Take 1 tablet by mouth as needed.     lisinopril  (ZESTRIL ) 20 MG tablet Take 1 tablet by mouth once daily 30 tablet 1   Magnesium 500 MG TABS Take 500 mg by mouth daily.     Multiple Vitamin (MULTIVITAMIN PO) Take 1 tablet by mouth daily.     naproxen sodium (ALEVE) 220 MG tablet Take 220 mg by mouth as needed.     neomycin-polymyxin b-dexamethasone (MAXITROL) 3.5-10000-0.1 SUSP Place 1 drop into the right eye 2 (  two) times daily.     umeclidinium bromide  (INCRUSE ELLIPTA ) 62.5 MCG/ACT AEPB Inhale 1 puff into the lungs daily. 1 each 4   vitamin B-12 (CYANOCOBALAMIN) 1000 MCG tablet Take 1,000 mcg by mouth daily.     No current facility-administered medications for this visit.    PHYSICAL EXAM There were no vitals filed for this visit.   Chronically ill woman in no distress Regular rate and rhythm Unlabored breathing Weakly palpable posterior tibial pulses bilaterally    PERTINENT LABORATORY AND RADIOLOGIC DATA  Most recent CBC    Latest Ref Rng & Units 03/25/2024   10:58 AM 11/24/2023    6:03 PM 08/14/2023    4:05 PM  CBC  WBC 4.0 - 10.5 K/uL 8.1  11.3  5.4   Hemoglobin 12.0 - 15.0 g/dL 89.6  9.2  89.5   Hematocrit 36.0 - 46.0 % 30.5  28.3  31.7   Platelets 150 - 400 K/uL 419  432  389      Most recent CMP    Latest Ref Rng & Units 11/24/2023    6:03 PM 08/14/2023    4:05 PM 07/05/2023    3:02 PM  CMP  Glucose 70 - 99 mg/dL 884  81  860   BUN 8 - 23 mg/dL 11  15  29    Creatinine 0.44 - 1.00 mg/dL 8.84  8.99  8.29   Sodium 135 - 145 mmol/L 137  135  135   Potassium 3.5 - 5.1 mmol/L 4.3  3.9  4.3   Chloride 98 - 111 mmol/L 102  99  98   CO2 22 - 32 mmol/L 23  25  25    Calcium 8.9 - 10.3 mg/dL 9.9  89.7  9.8   Total Protein 6.5 - 8.1  g/dL 7.1  7.7    Total Bilirubin 0.0 - 1.2 mg/dL 0.2  0.3    Alkaline Phos 38 - 126 U/L 117  80    AST 15 - 41 U/L 18  26    ALT 0 - 44 U/L 10  19      Renal function CrCl cannot be calculated (Patient's most recent lab result is older than the maximum 21 days allowed.).  No results found for: HGBA1C  LDL Cholesterol  Date Value Ref Range Status  12/23/2019 142 (H) 0 - 99 mg/dL Final   Direct LDL  Date Value Ref Range Status  12/23/2019 144.0 mg/dL Final    Comment:    Optimal:  <100 mg/dLNear or Above Optimal:  100-129 mg/dLBorderline High:  130-159 mg/dLHigh:  160-189 mg/dLVery High:  >190 mg/dL     +-------+-----------+-----------+------------+------------+  ABI/TBIToday's ABIToday's TBIPrevious ABIPrevious TBI  +-------+-----------+-----------+------------+------------+  Right 1.02       0.67       0.97                      +-------+-----------+-----------+------------+------------+  Left  1.16       073        0.99                      +-------+-----------+-----------+------------+------------+   Valerie SAILOR. Magda, MD FACS [facility] Phone Number: [phone] 05/12/2024 9:06 PM   Total time spent on preparing this encounter including chart review, data review, collecting history, examining the patient, and coordinating care: 45 minutes  Portions of this report may have been transcribed using voice recognition software.  Every effort has been made to ensure accuracy; however, inadvertent computerized transcription errors may still be present.

## 2024-05-13 ENCOUNTER — Ambulatory Visit (HOSPITAL_COMMUNITY): Admission: RE | Admit: 2024-05-13

## 2024-05-13 ENCOUNTER — Ambulatory Visit (HOSPITAL_COMMUNITY): Attending: Vascular Surgery | Admitting: Vascular Surgery

## 2024-05-14 ENCOUNTER — Telehealth: Payer: Self-pay | Admitting: Gastroenterology

## 2024-05-14 NOTE — Telephone Encounter (Addendum)
 Procedure:Colonoscopy/Endoscopy Procedure date: 05/22/24 Procedure location: [facility] Arrival Time: 7:30 am Spoke with the patient Y/N: Yes Any prep concerns? No Has the patient obtained the prep from the pharmacy ? Yes Do you have a care partner and transportation: Yes Any additional concerns? No

## 2024-05-21 ENCOUNTER — Telehealth: Payer: Self-pay | Admitting: Gastroenterology

## 2024-05-21 ENCOUNTER — Telehealth: Payer: Self-pay

## 2024-05-21 NOTE — Telephone Encounter (Signed)
  Cirigliano, Vito V, DO  [place], [place], RN; [place], [place], RN; Wonda Standing, RN Ok thanks for letting us  know. Hopefully we have a soon enough spot available to reschedule.  ----- Message ----- From: Gregoria Odean SQUIBB, RN Sent: 05/21/2024   9:58 AM EDT To: Standing Wonda, RN; Sandor San GAILS, DO; Bro* Subject: Pt needs to be canceled for tomorrow          Hello, I just pre-called this pt scheduled for 10/23 and she said she had nausea and a fever of 101. We asked her to contact the office to reschedule. We are going to go ahead and cancel her off the schedule. Thanks, Martine

## 2024-05-21 NOTE — Telephone Encounter (Signed)
 Inbound call from patient wanting to schedule her procedure due to her being sick and having a fever. Patient is requesting a call back. Please advise.

## 2024-05-21 NOTE — Telephone Encounter (Signed)
See 10/22 TE

## 2024-05-21 NOTE — Telephone Encounter (Signed)
 Called & spoke with patient. Hospital EGD/colonoscopy have been rescheduled to Tuesday, 06/03/24 at 2:45 pm. Patient is aware that this appt will be at [facility] and she will need to arrive at 1:15 pm with a care partner. Patient knows to expect updated instructions via MyChart today and a hard copy will come in the mail. Patient verbalized understanding and had no concerns at the end of the call.

## 2024-05-22 ENCOUNTER — Ambulatory Visit (HOSPITAL_COMMUNITY): Admission: RE | Admit: 2024-05-22 | Source: Home / Self Care | Admitting: Gastroenterology

## 2024-05-22 ENCOUNTER — Encounter (HOSPITAL_COMMUNITY): Admission: RE | Payer: Self-pay | Source: Home / Self Care

## 2024-05-22 SURGERY — COLONOSCOPY
Anesthesia: Monitor Anesthesia Care

## 2024-05-26 ENCOUNTER — Telehealth: Payer: Self-pay

## 2024-05-26 ENCOUNTER — Inpatient Hospital Stay

## 2024-05-26 NOTE — Telephone Encounter (Signed)
 Called regarding missing lab appt today. Moved to 10/28 per her request. She is aware of time.

## 2024-05-27 ENCOUNTER — Inpatient Hospital Stay

## 2024-05-28 NOTE — Progress Notes (Signed)
 Attempted to obtain medical history for pre op call via telephone, unable to reach at this time. HIPAA compliant voicemail message left requesting return call to pre surgical testing department.

## 2024-05-29 ENCOUNTER — Inpatient Hospital Stay: Attending: Hematology and Oncology

## 2024-05-29 ENCOUNTER — Telehealth: Payer: Self-pay | Admitting: Gastroenterology

## 2024-05-29 ENCOUNTER — Telehealth: Payer: Self-pay

## 2024-05-29 DIAGNOSIS — D509 Iron deficiency anemia, unspecified: Secondary | ICD-10-CM | POA: Insufficient documentation

## 2024-05-29 DIAGNOSIS — D75838 Other thrombocytosis: Secondary | ICD-10-CM

## 2024-05-29 LAB — CBC WITH DIFFERENTIAL (CANCER CENTER ONLY)
Abs Immature Granulocytes: 0.01 K/uL (ref 0.00–0.07)
Basophils Absolute: 0.1 K/uL (ref 0.0–0.1)
Basophils Relative: 1 %
Eosinophils Absolute: 0.1 K/uL (ref 0.0–0.5)
Eosinophils Relative: 2 %
HCT: 36.8 % (ref 36.0–46.0)
Hemoglobin: 12.9 g/dL (ref 12.0–15.0)
Immature Granulocytes: 0 %
Lymphocytes Relative: 23 %
Lymphs Abs: 1.4 K/uL (ref 0.7–4.0)
MCH: 34.9 pg — ABNORMAL HIGH (ref 26.0–34.0)
MCHC: 35.1 g/dL (ref 30.0–36.0)
MCV: 99.5 fL (ref 80.0–100.0)
Monocytes Absolute: 0.5 K/uL (ref 0.1–1.0)
Monocytes Relative: 8 %
Neutro Abs: 4.1 K/uL (ref 1.7–7.7)
Neutrophils Relative %: 66 %
Platelet Count: 264 K/uL (ref 150–400)
RBC: 3.7 MIL/uL — ABNORMAL LOW (ref 3.87–5.11)
RDW: 16.1 % — ABNORMAL HIGH (ref 11.5–15.5)
WBC Count: 6.2 K/uL (ref 4.0–10.5)
nRBC: 0 % (ref 0.0–0.2)

## 2024-05-29 LAB — IRON AND IRON BINDING CAPACITY (CC-WL,HP ONLY)
Iron: 139 ug/dL (ref 28–170)
Saturation Ratios: 42 % — ABNORMAL HIGH (ref 10.4–31.8)
TIBC: 332 ug/dL (ref 250–450)
UIBC: 193 ug/dL (ref 148–442)

## 2024-05-29 LAB — FERRITIN: Ferritin: 203 ng/mL (ref 11–307)

## 2024-05-29 NOTE — Telephone Encounter (Signed)
 Called & spoke with patient. Patient is aware that Dr. Rennis next available hospital slot is 07/29/24 at 8:54 am. Patient is okay with the new appt time & date. Patient knows that we will send new prep instructions via MyChart today & a copy will be mailed to her as well. Patient verbalized understanding.

## 2024-05-29 NOTE — Telephone Encounter (Signed)
 Procedure:COLON Procedure date: 06/03/24 Procedure location: [facility] Arrival Time: 1:15 Spoke with the patient Y/N: N Any prep concerns? N  Has the patient obtained the prep from the pharmacy ? N Do you have a care partner and transportation: N Any additional concerns? N

## 2024-05-29 NOTE — Telephone Encounter (Signed)
 Inbound call from patient stating she is going to have to reschedule procedure on 06/03/24 at [facility] due to having another appointment with her hematologist  Requesting a call back  Please advise  Thank you

## 2024-06-02 ENCOUNTER — Encounter: Payer: Self-pay | Admitting: Radiology

## 2024-06-03 ENCOUNTER — Encounter: Payer: Self-pay | Admitting: Hematology and Oncology

## 2024-06-03 ENCOUNTER — Inpatient Hospital Stay: Attending: Hematology and Oncology | Admitting: Hematology and Oncology

## 2024-06-03 VITALS — BP 168/88 | HR 111 | Temp 98.2°F | Resp 18 | Ht 64.0 in | Wt 126.4 lb

## 2024-06-03 DIAGNOSIS — D509 Iron deficiency anemia, unspecified: Secondary | ICD-10-CM | POA: Diagnosis not present

## 2024-06-03 DIAGNOSIS — K5520 Angiodysplasia of colon without hemorrhage: Secondary | ICD-10-CM | POA: Diagnosis not present

## 2024-06-03 NOTE — Progress Notes (Signed)
   [facility] OFFICE PROGRESS NOTE  Ilah Crigler, MD  ASSESSMENT & PLAN:  Assessment & Plan Iron  deficiency anemia, unspecified iron  deficiency anemia type She has history of known AVM She has received numerous doses of intravenous iron  infusion over the past 3 years She just received her last dose of intravenous iron  a month ago Repeat CBC and iron  studies from a week ago showed resolution of anemia with adequate ferritin level Plan to repeat labs and see her again in 2 months    Orders Placed This Encounter  Procedures   Iron  and Iron  Binding Capacity ([facility] only)    Standing Status:   Future    Expiration Date:   06/03/2025   CBC with Differential (Cancer Center Only)    Standing Status:   Future    Expiration Date:   06/03/2025   Ferritin    Standing Status:   Future    Expiration Date:   06/03/2025    INTERVAL HISTORY: Patient returns for recurrent anemia Symptoms of anemia includes none We reviewed CBC and iron  studies  SUMMARY OF HEMATOLOGIC HISTORY:  She was found to have abnormal CBC from blood work I have reviewed her CBC dated back to 2021 She used to have history of normal hemoglobin but starting in 2021, she become anemic may have microcytosis She had numerous endoscopy evaluation as listed: - Colonoscopy (02/2020): 3 subcentimeter adenomas in the descending/ascending colon, 8 mm TA in sigmoid, 6 smaller sigmoid hyperplastic polyps.  25 mm polyp in the proximal rectum not resected in favor of EMR at the hospital.  Single small cecal AVM.  Internal hemorrhoids, sigmoid diverticulosis.  Normal TI - Colonoscopy (04/2020): 30 mm tubular adenoma with focal high grade dysplasia via piecemeal technique EMR.  Sigmoid diverticulosis - EGD (04/2020): 3 cm HH, otherwise normal - Colonoscopy (11/2020): Small cecal AVM, 4 sigmoid hyperplastic polyps ranging 1-3 mm (path:), 3 benign rectal hyperplastic polyps.  Post polypectomy scar in rectum (biopsied: Normal,  benign tissue).  Sigmoid diverticulosis, medium sized grade 2 internal hemorrhoids.   She hasrecent chest pain on exertion, shortness of breath on minimal exertion, pre-syncopal episodes, palpitations, leg cramps and fatigue. She had not noticed any recent bleeding such as epistaxis, hematuria or hematochezia The patient takes Aleve regularly for chronic back pain. She is not on antiplatelets agents. Her last colonoscopy was last year She had no prior history or diagnosis of cancer but but have history of abnormal Pap smear, treated with laser therapy. Her age appropriate screening programs are up-to-date. She denies any pica and eats a variety of diet. She donated blood once but has never received blood transfusion The patient was prescribed oral iron  supplements and she takes 1 daily for 2 years without success of improving her blood count She stated that the oral iron  supplement cause constipation and bloating From 2023-2025, she has received multiple doses of intravenous iron  infusion, last dose of intravenous iron  in October 2025  Lab Results  Component Value Date   VITAMINB12 367 12/27/2021   FERRITIN 203 05/29/2024   HGB 12.9 05/29/2024   RBC 3.70 (L) 05/29/2024   Vitals:   06/03/24 1029  BP: (!) 168/88  Pulse: (!) 111  Resp: 18  Temp: 98.2 F (36.8 C)  SpO2: 98%

## 2024-06-03 NOTE — Assessment & Plan Note (Addendum)
 She has history of known AVM She has received numerous doses of intravenous iron  infusion over the past 3 years She just received her last dose of intravenous iron  a month ago Repeat CBC and iron  studies from a week ago showed resolution of anemia with adequate ferritin level Plan to repeat labs and see her again in 2 months

## 2024-07-18 ENCOUNTER — Encounter (HOSPITAL_COMMUNITY): Payer: Self-pay | Admitting: Gastroenterology

## 2024-07-18 ENCOUNTER — Telehealth: Payer: Self-pay

## 2024-07-18 NOTE — Telephone Encounter (Signed)
 Procedure:COLON Procedure date: 07/29/24 Procedure location: [facility] Arrival Time: 7:15 Spoke with the patient Y/N: N Any prep concerns?  Has the patient obtained the prep from the pharmacy ? N Do you have a care partner and transportation: N Any additional concerns? N   I called patient on 3 different occasions. I left a detailed message about the procedure and left the office number in case patient has questions are concerns.

## 2024-07-18 NOTE — Progress Notes (Signed)
 Attempted to obtain medical history for pre op call via telephone, unable to reach at this time. HIPAA compliant voicemail message left requesting return call to pre surgical testing department.

## 2024-07-22 NOTE — Telephone Encounter (Signed)
 Left detailed message for patient to call back & confirm colon date. Mychart message sent as well.

## 2024-07-25 NOTE — Telephone Encounter (Signed)
 Left message for patient to call back

## 2024-07-28 ENCOUNTER — Inpatient Hospital Stay

## 2024-07-28 NOTE — Anesthesia Preprocedure Evaluation (Signed)
"                                    Anesthesia Evaluation    Reviewed: Allergy & Precautions, Patient's Chart, lab work & pertinent test results  Airway        Dental   Pulmonary COPD,  COPD inhaler, Current Smoker and Patient abstained from smoking.          Cardiovascular hypertension,      Neuro/Psych  PSYCHIATRIC DISORDERS  Depression       GI/Hepatic Neg liver ROS,GERD  ,,  Endo/Other  negative endocrine ROS    Renal/GU      Musculoskeletal   Abdominal   Peds  Hematology   Anesthesia Other Findings   Reproductive/Obstetrics                              Anesthesia Physical Anesthesia Plan  ASA: 2  Anesthesia Plan: MAC   Post-op Pain Management: Minimal or no pain anticipated   Induction:   PONV Risk Score and Plan: Treatment may vary due to age or medical condition and Propofol  infusion  Airway Management Planned: Natural Airway and Nasal Cannula  Additional Equipment: None  Intra-op Plan:   Post-operative Plan:   Informed Consent:   Plan Discussed with:   Anesthesia Plan Comments: (Hx of colon polyps and Anemia for Colonoscopy)        Anesthesia Quick Evaluation  "

## 2024-07-29 ENCOUNTER — Ambulatory Visit (HOSPITAL_COMMUNITY): Admission: RE | Admit: 2024-07-29 | Source: Home / Self Care | Admitting: Gastroenterology

## 2024-07-29 ENCOUNTER — Encounter (HOSPITAL_COMMUNITY): Payer: Self-pay | Admitting: Anesthesiology

## 2024-07-29 ENCOUNTER — Telehealth (HOSPITAL_COMMUNITY): Payer: Self-pay

## 2024-07-29 NOTE — Telephone Encounter (Signed)
 Valerie Russell was scheduled for colonoscopy  with Dr. San on July 29, 2024, at [facility].   Patient returned phone call on day of procedure to cancel their procedure due to flu.  Dr San & office notified. Patient instructed to call physician's office to reschedule their procedure. Patient demonstrated understanding.

## 2024-08-01 NOTE — Telephone Encounter (Signed)
 See phone note 07/29/24.

## 2024-08-04 ENCOUNTER — Inpatient Hospital Stay: Admitting: Hematology and Oncology

## 2024-08-05 ENCOUNTER — Inpatient Hospital Stay: Attending: Hematology and Oncology

## 2024-08-05 DIAGNOSIS — K5520 Angiodysplasia of colon without hemorrhage: Secondary | ICD-10-CM | POA: Insufficient documentation

## 2024-08-05 DIAGNOSIS — D509 Iron deficiency anemia, unspecified: Secondary | ICD-10-CM | POA: Diagnosis present

## 2024-08-05 LAB — CBC WITH DIFFERENTIAL (CANCER CENTER ONLY)
Abs Immature Granulocytes: 0.02 K/uL (ref 0.00–0.07)
Basophils Absolute: 0.1 K/uL (ref 0.0–0.1)
Basophils Relative: 1 %
Eosinophils Absolute: 0.2 K/uL (ref 0.0–0.5)
Eosinophils Relative: 4 %
HCT: 34.9 % — ABNORMAL LOW (ref 36.0–46.0)
Hemoglobin: 12.1 g/dL (ref 12.0–15.0)
Immature Granulocytes: 0 %
Lymphocytes Relative: 27 %
Lymphs Abs: 1.7 K/uL (ref 0.7–4.0)
MCH: 36.2 pg — ABNORMAL HIGH (ref 26.0–34.0)
MCHC: 34.7 g/dL (ref 30.0–36.0)
MCV: 104.5 fL — ABNORMAL HIGH (ref 80.0–100.0)
Monocytes Absolute: 0.7 K/uL (ref 0.1–1.0)
Monocytes Relative: 10 %
Neutro Abs: 3.8 K/uL (ref 1.7–7.7)
Neutrophils Relative %: 58 %
Platelet Count: 347 K/uL (ref 150–400)
RBC: 3.34 MIL/uL — ABNORMAL LOW (ref 3.87–5.11)
RDW: 12.7 % (ref 11.5–15.5)
WBC Count: 6.5 K/uL (ref 4.0–10.5)
nRBC: 0 % (ref 0.0–0.2)

## 2024-08-05 LAB — IRON AND IRON BINDING CAPACITY (CC-WL,HP ONLY)
Iron: 372 ug/dL — ABNORMAL HIGH (ref 28–170)
Saturation Ratios: 84 % — ABNORMAL HIGH (ref 10.4–31.8)
TIBC: 445 ug/dL (ref 250–450)
UIBC: 73 ug/dL

## 2024-08-05 LAB — FERRITIN: Ferritin: 63 ng/mL (ref 11–307)

## 2024-08-05 NOTE — Telephone Encounter (Signed)
 Left message for pt to call back: Note Dr. San has availability on 08/19/2024, 09/18/2024

## 2024-08-07 NOTE — Telephone Encounter (Signed)
 Multiple attempts to reach patient. Left detailed message for patient to call back & reschedule. Letter sent by mail to pt.

## 2024-08-12 ENCOUNTER — Inpatient Hospital Stay: Admitting: Hematology and Oncology

## 2024-08-12 VITALS — BP 145/78 | HR 100 | Temp 98.2°F | Resp 18 | Ht 64.0 in | Wt 128.0 lb

## 2024-08-12 DIAGNOSIS — D509 Iron deficiency anemia, unspecified: Secondary | ICD-10-CM

## 2024-08-12 NOTE — Assessment & Plan Note (Addendum)
 She has history of known AVM She has received numerous doses of intravenous iron  infusion over the past 3 years Repeat CBC and iron  studies from a week ago showed resolution of anemia with adequate ferritin level Plan to repeat labs and see her again in 6 months

## 2024-08-12 NOTE — Progress Notes (Signed)
 "  [facility] OFFICE PROGRESS NOTE  Valerie Crigler, MD  ASSESSMENT & PLAN:  Assessment & Plan Iron  deficiency anemia, unspecified iron  deficiency anemia type She has history of known AVM She has received numerous doses of intravenous iron  infusion over the past 3 years Repeat CBC and iron  studies from a week ago showed resolution of anemia with adequate ferritin level Plan to repeat labs and see her again in 6 months    Orders Placed This Encounter  Procedures   Iron  and Iron  Binding Capacity ([facility] only)    Standing Status:   Future    Expiration Date:   08/12/2025   CBC with Differential (Cancer Center Only)    Standing Status:   Future    Expiration Date:   08/12/2025   Sedimentation rate    Standing Status:   Future    Expiration Date:   08/12/2025   Reticulocytes    Standing Status:   Future    Expiration Date:   08/12/2025   Ferritin    Standing Status:   Future    Expiration Date:   08/12/2025   Vitamin B12    Standing Status:   Future    Expiration Date:   08/12/2025    INTERVAL HISTORY: Patient returns for recurrent anemia Symptoms of anemia includes none We reviewed CBC and iron  studies  SUMMARY OF HEMATOLOGIC HISTORY:  She was found to have abnormal CBC from blood work I have reviewed her CBC dated back to 2021 She used to have history of normal hemoglobin but starting in 2021, she become anemic may have microcytosis She had numerous endoscopy evaluation as listed: - Colonoscopy (02/2020): 3 subcentimeter adenomas in the descending/ascending colon, 8 mm TA in sigmoid, 6 smaller sigmoid hyperplastic polyps.  25 mm polyp in the proximal rectum not resected in favor of EMR at the hospital.  Single small cecal AVM.  Internal hemorrhoids, sigmoid diverticulosis.  Normal TI - Colonoscopy (04/2020): 30 mm tubular adenoma with focal high grade dysplasia via piecemeal technique EMR.  Sigmoid diverticulosis - EGD (04/2020): 3 cm HH, otherwise normal -  Colonoscopy (11/2020): Small cecal AVM, 4 sigmoid hyperplastic polyps ranging 1-3 mm (path:), 3 benign rectal hyperplastic polyps.  Post polypectomy scar in rectum (biopsied: Normal, benign tissue).  Sigmoid diverticulosis, medium sized grade 2 internal hemorrhoids.   She hasrecent chest pain on exertion, shortness of breath on minimal exertion, pre-syncopal episodes, palpitations, leg cramps and fatigue. She had not noticed any recent bleeding such as epistaxis, hematuria or hematochezia The patient takes Aleve regularly for chronic back pain. She is not on antiplatelets agents. Her last colonoscopy was last year She had no prior history or diagnosis of cancer but but have history of abnormal Pap smear, treated with laser therapy. Her age appropriate screening programs are up-to-date. She denies any pica and eats a variety of diet. She donated blood once but has never received blood transfusion The patient was prescribed oral iron  supplements and she takes 1 daily for 2 years without success of improving her blood count She stated that the oral iron  supplement cause constipation and bloating From 2023-2025, she has received multiple doses of intravenous iron  infusion, last dose of intravenous iron  in October 2025  Lab Results  Component Value Date   VITAMINB12 367 12/27/2021   FERRITIN 63 08/05/2024   HGB 12.1 08/05/2024   RBC 3.34 (L) 08/05/2024   Vitals:   08/12/24 1204  BP: (!) 145/78  Pulse: 100  Resp: 18  Temp: 98.2 F (36.8 C)  SpO2: 100%   "

## 2025-02-02 ENCOUNTER — Inpatient Hospital Stay

## 2025-02-09 ENCOUNTER — Inpatient Hospital Stay: Admitting: Hematology and Oncology
